# Patient Record
Sex: Male | Born: 1965
Health system: Southern US, Community
[De-identification: ages and names within clinical notes are randomized; demographics above are authoritative.]

## PROBLEM LIST (undated history)

## (undated) DIAGNOSIS — G4730 Sleep apnea, unspecified: Secondary | ICD-10-CM

## (undated) DIAGNOSIS — E785 Hyperlipidemia, unspecified: Secondary | ICD-10-CM

## (undated) DIAGNOSIS — T7840XA Allergy, unspecified, initial encounter: Secondary | ICD-10-CM

## (undated) DIAGNOSIS — I10 Essential (primary) hypertension: Secondary | ICD-10-CM

## (undated) DIAGNOSIS — R569 Unspecified convulsions: Secondary | ICD-10-CM

## (undated) DIAGNOSIS — I219 Acute myocardial infarction, unspecified: Secondary | ICD-10-CM

## (undated) DIAGNOSIS — C801 Malignant (primary) neoplasm, unspecified: Secondary | ICD-10-CM

## (undated) DIAGNOSIS — G709 Myoneural disorder, unspecified: Secondary | ICD-10-CM

## (undated) DIAGNOSIS — G4700 Insomnia, unspecified: Secondary | ICD-10-CM

## (undated) DIAGNOSIS — M1990 Unspecified osteoarthritis, unspecified site: Secondary | ICD-10-CM

## (undated) HISTORY — DX: Acute myocardial infarction, unspecified: I21.9

## (undated) HISTORY — DX: Malignant (primary) neoplasm, unspecified: C80.1

## (undated) HISTORY — DX: Unspecified osteoarthritis, unspecified site: M19.90

## (undated) HISTORY — PX: BACK SURGERY: SHX140

## (undated) HISTORY — PX: JOINT REPLACEMENT: SHX530

## (undated) HISTORY — PX: HAND SURGERY: SHX662

## (undated) HISTORY — DX: Sleep apnea, unspecified: G47.30

## (undated) HISTORY — DX: Unspecified convulsions: R56.9

## (undated) HISTORY — DX: Hyperlipidemia, unspecified: E78.5

## (undated) HISTORY — PX: SPINE SURGERY: SHX786

## (undated) HISTORY — DX: Allergy, unspecified, initial encounter: T78.40XA

## (undated) HISTORY — DX: Essential (primary) hypertension: I10

## (undated) HISTORY — DX: Insomnia, unspecified: G47.00

## (undated) HISTORY — PX: FRACTURE SURGERY: SHX138

---

## 2000-05-09 ENCOUNTER — Ambulatory Visit (HOSPITAL_BASED_OUTPATIENT_CLINIC_OR_DEPARTMENT_OTHER): Admission: RE | Admit: 2000-05-09 | Discharge: 2000-05-09 | Payer: Self-pay | Admitting: Orthopedic Surgery

## 2000-10-11 ENCOUNTER — Ambulatory Visit (HOSPITAL_BASED_OUTPATIENT_CLINIC_OR_DEPARTMENT_OTHER): Admission: RE | Admit: 2000-10-11 | Discharge: 2000-10-11 | Payer: Self-pay | Admitting: Orthopedic Surgery

## 2012-07-12 ENCOUNTER — Other Ambulatory Visit: Payer: Self-pay

## 2012-07-12 MED ORDER — PHENOBARBITAL 100 MG PO TABS: 100.0000 mg | ORAL_TABLET | Freq: Every day | ORAL | Status: DC

## 2012-07-12 MED ORDER — PHENOBARBITAL 16.2 MG PO TABS: 16.2000 mg | ORAL_TABLET | Freq: Two times a day (BID) | ORAL | Status: DC

## 2012-07-12 MED ORDER — VALSARTAN 80 MG PO TABS: 80.0000 mg | ORAL_TABLET | Freq: Every day | ORAL | Status: DC

## 2012-07-12 NOTE — Telephone Encounter (Signed)
Last seen 01/09/12  Last filled 04/15/12 x 2  If approved have nurse call in Phenobarb and notify patient

## 2012-07-15 ENCOUNTER — Telehealth: Payer: Self-pay | Admitting: Nurse Practitioner

## 2012-07-16 NOTE — Telephone Encounter (Signed)
Both still need to b called in?

## 2012-07-16 NOTE — Telephone Encounter (Signed)
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  Called meds to [facility].

## 2012-07-19 ENCOUNTER — Encounter: Payer: Self-pay | Admitting: Nurse Practitioner

## 2012-07-19 ENCOUNTER — Ambulatory Visit (INDEPENDENT_AMBULATORY_CARE_PROVIDER_SITE_OTHER): Payer: BC Managed Care – PPO | Admitting: Nurse Practitioner

## 2012-07-19 VITALS — BP 119/82 | HR 79 | Temp 98.0°F | Ht 72.0 in | Wt 235.0 lb

## 2012-07-19 DIAGNOSIS — I10 Essential (primary) hypertension: Secondary | ICD-10-CM

## 2012-07-19 DIAGNOSIS — Z125 Encounter for screening for malignant neoplasm of prostate: Secondary | ICD-10-CM

## 2012-07-19 DIAGNOSIS — R569 Unspecified convulsions: Secondary | ICD-10-CM

## 2012-07-19 DIAGNOSIS — E785 Hyperlipidemia, unspecified: Secondary | ICD-10-CM | POA: Insufficient documentation

## 2012-07-19 LAB — COMPLETE METABOLIC PANEL WITH GFR
ALT: 24 U/L (ref 0–53)
AST: 32 U/L (ref 0–37)
Albumin: 4.5 g/dL (ref 3.5–5.2)
Alkaline Phosphatase: 129 U/L — ABNORMAL HIGH (ref 39–117)
BUN: 11 mg/dL (ref 6–23)
CO2: 28 mEq/L (ref 19–32)
Calcium: 9.7 mg/dL (ref 8.4–10.5)
Chloride: 103 mEq/L (ref 96–112)
Creat: 1.11 mg/dL (ref 0.50–1.35)
GFR, Est African American: >89 mL/min
GFR, Est Non African American: 78 mL/min
Glucose, Bld: 84 mg/dL (ref 70–99)
Potassium: 3.8 mEq/L (ref 3.5–5.3)
Sodium: 140 mEq/L (ref 135–145)
Total Bilirubin: 0.5 mg/dL (ref 0.3–1.2)
Total Protein: 7.6 g/dL (ref 6.0–8.3)

## 2012-07-19 MED ORDER — ROSUVASTATIN CALCIUM 10 MG PO TABS: 10.0000 mg | ORAL_TABLET | Freq: Every day | ORAL | Status: DC

## 2012-07-19 MED ORDER — GRISEOFULVIN MICROSIZE 500 MG PO TABS: 500.0000 mg | ORAL_TABLET | Freq: Every day | ORAL | Status: DC

## 2012-07-19 MED ORDER — OMEGA-3-ACID ETHYL ESTERS 1 G PO CAPS: 2.0000 g | ORAL_CAPSULE | Freq: Two times a day (BID) | ORAL | Status: DC

## 2012-07-19 MED ORDER — PHENOBARBITAL 16.2 MG PO TABS: 16.2000 mg | ORAL_TABLET | Freq: Two times a day (BID) | ORAL | Status: DC

## 2012-07-19 MED ORDER — PHENOBARBITAL 100 MG PO TABS: 100.0000 mg | ORAL_TABLET | Freq: Every day | ORAL | Status: DC

## 2012-07-19 MED ORDER — NIACIN ER (ANTIHYPERLIPIDEMIC) 500 MG PO TBCR: 500.0000 mg | EXTENDED_RELEASE_TABLET | Freq: Every day | ORAL | Status: DC

## 2012-07-19 MED ORDER — VALSARTAN 80 MG PO TABS: 80.0000 mg | ORAL_TABLET | Freq: Every day | ORAL | Status: DC

## 2012-07-19 NOTE — Progress Notes (Signed)
Subjective:    Patient ID: Christopher Salas, male    DOB: [birth year], 47 y.o.   MRN: [id number]  Hypertension This is a chronic problem. The current episode started more than 1 year ago. The problem is unchanged. The problem is controlled. Pertinent negatives include no blurred vision, chest pain, headaches, palpitations, peripheral edema, PND, shortness of breath or sweats. There are no associated agents to hypertension. Risk factors for coronary artery disease include dyslipidemia, obesity, male gender and sedentary lifestyle. Past treatments include angiotensin blockers. The current treatment provides significant improvement. Compliance problems include diet and exercise.   Hyperlipidemia This is a chronic problem. The current episode started more than 1 year ago. The problem is controlled. Recent lipid tests were reviewed and are normal. Exacerbating diseases include obesity. There are no known factors aggravating his hyperlipidemia. Pertinent negatives include no chest pain, focal weakness, leg pain, myalgias or shortness of breath. Current antihyperlipidemic treatment includes statins and fibric acid derivatives. The current treatment provides moderate improvement of lipids. Compliance problems include adherence to diet and adherence to exercise.  Risk factors for coronary artery disease include male sex, hypertension and obesity.  Seizures  This is a chronic problem. Episode onset: greater than 20 years. The problem has not changed since onset.There were 2 to 3 (Last seizure was over 19 years ago) seizures. The most recent episode lasted less than 30 seconds. Pertinent negatives include no headaches and no chest pain. Characteristics include rhythmic jerking and loss of consciousness. The episode was witnessed. There was the sensation of an aura present. The seizure(s) had no focality.  * Skin lesion on forehead and post neck- Getting larger    Review of Systems  Constitutional: Negative.   HENT:  Negative.   Eyes: Negative.  Negative for blurred vision.  Respiratory: Negative for shortness of breath.   Cardiovascular: Negative for chest pain, palpitations and PND.  Genitourinary: Negative.   Musculoskeletal: Negative for myalgias.  Neurological: Positive for seizures and loss of consciousness. Negative for focal weakness and headaches.  Hematological: Negative.   Psychiatric/Behavioral: Negative.        Objective:   Physical Exam  Constitutional: He is oriented to person, place, and time. He appears well-developed and well-nourished.  HENT:  Head: Normocephalic.  Right Ear: External ear normal.  Left Ear: External ear normal.  Nose: Nose normal.  Mouth/Throat: Oropharynx is clear and moist.  Eyes: Conjunctivae and EOM are normal. Pupils are equal, round, and reactive to light.  Neck: Normal range of motion. Neck supple.  Cardiovascular: Normal rate, regular rhythm, normal heart sounds and intact distal pulses.   Pulmonary/Chest: Effort normal and breath sounds normal.  Abdominal: Soft. Bowel sounds are normal.  Neurological: He is alert and oriented to person, place, and time. He has normal reflexes.  Skin: Skin is warm.  Annular rash with central clearing on forehead and post neck  Psychiatric: He has a normal mood and affect. His behavior is normal. Judgment and thought content normal.   BP 119/82  Pulse 79  Temp(Src) 98 F (36.7 C) (Oral)  Ht 6' (1.829 m)  Wt 235 lb (106.595 kg)  BMI 31.86 kg/m2        Assessment & Plan:  1. Hypertension Low Na+ diet - COMPLETE METABOLIC PANEL WITH GFR - valsartan (DIOVAN) 80 MG tablet; Take 1 tablet (80 mg total) by mouth daily.  Dispense: 90 tablet; Refill: 1  2. Hyperlipidemia Low fat diet and exercise - NMR Lipoprofile with Lipids - rosuvastatin (CRESTOR)  10 MG tablet; Take 1 tablet (10 mg total) by mouth daily.  Dispense: 90 tablet; Refill: 1 - niacin (NIASPAN) 500 MG CR tablet; Take 1 tablet (500 mg total) by  mouth at bedtime.  Dispense: 90 tablet; Refill: 1 - omega-3 acid ethyl esters (LOVAZA) 1 G capsule; Take 2 capsules (2 g total) by mouth 2 (two) times daily.  Dispense: 120 capsule; Refill: 5  3. Seizures  - Phenobarbital level - phenobarbital (LUMINAL) 16.2 MG tablet; Take 1 tablet (16.2 mg total) by mouth 2 (two) times daily.  Dispense: 60 tablet; Refill: 5 - PHENObarbital (LUMINAL) 100 MG tablet; Take 1 tablet (100 mg total) by mouth daily.  Dispense: 30 tablet; Refill: 5  4. Screening for prostate cancer  - PSA  5. Tinea Capitis DO NOT PICK OR SCRATCH -grifluvin 500 1 Po QD x12 WEEKS Mary-Margaret Daphine Deutscher, FNP

## 2012-07-19 NOTE — Patient Instructions (Signed)

## 2012-07-20 LAB — PSA: PSA: 2.02 ng/mL (ref <=4.00)

## 2012-07-20 LAB — PHENOBARBITAL LEVEL: Phenobarbital: 17.6 ug/mL (ref 15.0–40.0)

## 2012-07-23 LAB — NMR LIPOPROFILE WITH LIPIDS
Cholesterol, Total: 123 mg/dL (ref <200)
HDL Particle Number: 31.8 umol/L (ref >=30.5)
HDL Size: 8.6 nm — ABNORMAL LOW (ref >=9.2)
HDL-C: 36 mg/dL — ABNORMAL LOW (ref >=40)
LDL (calc): 43 mg/dL (ref <100)
LDL Particle Number: 627 nmol/L (ref <1000)
LDL Size: 19.6 nm — ABNORMAL LOW (ref >20.5)
LP-IR Score: 63 — ABNORMAL HIGH (ref <=45)
Large HDL-P: 2.6 umol/L — ABNORMAL LOW (ref >=4.8)
Large VLDL-P: 2.6 nmol/L (ref <=2.7)
Small LDL Particle Number: 511 nmol/L (ref <=527)
Triglycerides: 220 mg/dL — ABNORMAL HIGH (ref <150)
VLDL Size: 41.5 nm (ref <=46.6)

## 2012-08-14 ENCOUNTER — Other Ambulatory Visit: Payer: Self-pay | Admitting: Nurse Practitioner

## 2012-08-16 ENCOUNTER — Telehealth: Payer: Self-pay | Admitting: Nurse Practitioner

## 2012-08-16 MED ORDER — PHENOBARBITAL 16.2 MG PO TABS: 16.2000 mg | ORAL_TABLET | Freq: Two times a day (BID) | ORAL | Status: DC

## 2012-08-16 MED ORDER — PHENOBARBITAL 100 MG PO TABS: 100.0000 mg | ORAL_TABLET | Freq: Every day | ORAL | Status: DC

## 2012-08-16 NOTE — Telephone Encounter (Signed)
rx sent to pharmacy.patient aware. 

## 2012-08-16 NOTE — Telephone Encounter (Signed)
Please advise 

## 2012-08-16 NOTE — Telephone Encounter (Signed)
These were refilled yesterday.

## 2012-11-07 ENCOUNTER — Telehealth: Payer: Self-pay | Admitting: Family Medicine

## 2012-11-07 ENCOUNTER — Ambulatory Visit (INDEPENDENT_AMBULATORY_CARE_PROVIDER_SITE_OTHER): Payer: BC Managed Care – PPO

## 2012-11-07 ENCOUNTER — Encounter: Payer: Self-pay | Admitting: General Practice

## 2012-11-07 ENCOUNTER — Ambulatory Visit (INDEPENDENT_AMBULATORY_CARE_PROVIDER_SITE_OTHER): Payer: BC Managed Care – PPO | Admitting: General Practice

## 2012-11-07 VITALS — BP 136/89 | HR 76 | Temp 97.5°F | Ht 72.0 in | Wt 242.0 lb

## 2012-11-07 DIAGNOSIS — M629 Disorder of muscle, unspecified: Secondary | ICD-10-CM

## 2012-11-07 DIAGNOSIS — M6289 Other specified disorders of muscle: Secondary | ICD-10-CM

## 2012-11-07 DIAGNOSIS — M25519 Pain in unspecified shoulder: Secondary | ICD-10-CM

## 2012-11-07 DIAGNOSIS — M25511 Pain in right shoulder: Secondary | ICD-10-CM

## 2012-11-07 MED ORDER — NAPROXEN 500 MG PO TABS: 500.0000 mg | ORAL_TABLET | Freq: Two times a day (BID) | ORAL | Status: DC

## 2012-11-07 MED ORDER — METHYLPREDNISOLONE ACETATE 80 MG/ML IJ SUSP
80.0000 mg | Freq: Once | INTRAMUSCULAR | Status: AC
  Administered 2012-11-07: 80 mg via INTRAMUSCULAR

## 2012-11-07 MED ORDER — CYCLOBENZAPRINE HCL 5 MG PO TABS: 5.0000 mg | ORAL_TABLET | Freq: Two times a day (BID) | ORAL | Status: DC | PRN

## 2012-11-07 NOTE — Patient Instructions (Addendum)
Shoulder Pain  The shoulder is the joint that connects your arms to your body. The bones that form the shoulder joint include the upper arm bone (humerus), the shoulder blade (scapula), and the collarbone (clavicle). The top of the humerus is shaped like a ball and fits into a rather flat socket on the scapula (glenoid cavity). A combination of muscles and strong, fibrous tissues that connect muscles to bones (tendons) support your shoulder joint and hold the ball in the socket. Small, fluid-filled sacs (bursae) are located in different areas of the joint. They act as cushions between the bones and the overlying soft tissues and help reduce friction between the gliding tendons and the bone as you move your arm. Your shoulder joint allows a wide range of motion in your arm. This range of motion allows you to do things like scratch your back or throw a ball. However, this range of motion also makes your shoulder more prone to pain from overuse and injury.  Causes of shoulder pain can originate from both injury and overuse and usually can be grouped in the following four categories:   Redness, swelling, and pain (inflammation) of the tendon (tendinitis) or the bursae (bursitis).   Instability, such as a dislocation of the joint.   Inflammation of the joint (arthritis).   Broken bone (fracture).  HOME CARE INSTRUCTIONS    Apply ice to the sore area.   Put ice in a plastic bag.   Place a towel between your skin and the bag.   Leave the ice on for 15-20 minutes, 3-4 times per day for the first 2 days.   Stop using cold packs if they do not help with the pain.   If you have a shoulder sling or immobilizer, wear it as long as your caregiver instructs. Only remove it to shower or bathe. Move your arm as little as possible, but keep your hand moving to prevent swelling.   Squeeze a soft ball or foam pad as much as possible to help prevent swelling.   Only take over-the-counter or prescription medicines for pain,  discomfort, or fever as directed by your caregiver.  SEEK MEDICAL CARE IF:    Your shoulder pain increases, or new pain develops in your arm, hand, or fingers.   Your hand or fingers become cold and numb.   Your pain is not relieved with medicines.  SEEK IMMEDIATE MEDICAL CARE IF:    Your arm, hand, or fingers are numb or tingling.   Your arm, hand, or fingers are significantly swollen or turn white or blue.  MAKE SURE YOU:    Understand these instructions.   Will watch your condition.   Will get help right away if you are not doing well or get worse.  Document Released: 12/14/2004 Document Revised: 11/29/2011 Document Reviewed: 02/18/2011  [employer] Patient Information 2014 [employer].

## 2012-11-07 NOTE — Telephone Encounter (Signed)
Appt scheduled for 3:30. Patient aware. 

## 2012-11-07 NOTE — Progress Notes (Signed)
  Subjective:    Patient ID: Christopher Salas, male    DOB: 21-Feb-1966, 47 y.o.   MRN: [id number]  Shoulder Pain  The pain is present in the right shoulder. This is a new problem. The current episode started more than 1 month ago. There has been no history of extremity trauma. The problem occurs constantly. The problem has been gradually worsening. The quality of the pain is described as aching, sharp and burning. The pain is at a severity of 10/10. Associated symptoms include a limited range of motion. Pertinent negatives include no fever, joint locking, joint swelling, numbness or stiffness. The symptoms are aggravated by lying down and activity. He has tried acetaminophen and NSAIDS for the symptoms. Family history does not include gout or rheumatoid arthritis. There is no history of osteoarthritis.  Reports having right shoulder pain, which began 2.5 weeks after having surgery on right hand (also worn cast). Reports using OTC muscle rubs, without relief.     Review of Systems  Constitutional: Negative for fever and chills.  HENT: Negative for neck pain and neck stiffness.   Respiratory: Negative for chest tightness and shortness of breath.   Cardiovascular: Negative for chest pain and palpitations.  Musculoskeletal: Negative for stiffness.       Right shoulder pain  Neurological: Negative for numbness.       Objective:   Physical Exam  Constitutional: He is oriented to person, place, and time. He appears well-developed and well-nourished.  Cardiovascular: Normal rate, regular rhythm and normal heart sounds.   Pulmonary/Chest: Effort normal and breath sounds normal.  Musculoskeletal: He exhibits tenderness.  Tenderness to right shoulder upon palpation and with raising of right arm  Neurological: He is alert and oriented to person, place, and time.  Skin: Skin is warm and dry.  Psychiatric: He has a normal mood and affect.   [facility] reading (PRIMARY) by Coralie Keens, FNP-C, no fracture  or dislocation.                                        Assessment & Plan:  1. Right shoulder pain - DG Shoulder Right; Future - naproxen (NAPROSYN) 500 MG tablet; Take 1 tablet (500 mg total) by mouth 2 (two) times daily with a meal.  Dispense: 30 tablet; Refill: 0 - methylPREDNISolone acetate (DEPO-MEDROL) injection 80 mg; Inject 1 mL (80 mg total) into the muscle once. -RTO in 2 weeks for follow up and if no improvement, will refer to ortho  2. Muscle tightness - cyclobenzaprine (FLEXERIL) 5 MG tablet; Take 1 tablet (5 mg total) by mouth 2 (two) times daily as needed for muscle spasms.  Dispense: 15 tablet; Refill: 0 -Patient verbalized understanding -Coralie Keens, FNP-C

## 2012-11-14 ENCOUNTER — Other Ambulatory Visit: Payer: Self-pay | Admitting: Nurse Practitioner

## 2012-11-14 ENCOUNTER — Other Ambulatory Visit: Payer: Self-pay | Admitting: *Deleted

## 2012-11-14 DIAGNOSIS — E785 Hyperlipidemia, unspecified: Secondary | ICD-10-CM

## 2012-11-14 DIAGNOSIS — I10 Essential (primary) hypertension: Secondary | ICD-10-CM

## 2012-11-14 MED ORDER — OMEGA-3-ACID ETHYL ESTERS 1 G PO CAPS: 2.0000 g | ORAL_CAPSULE | Freq: Two times a day (BID) | ORAL | Status: DC

## 2012-11-14 MED ORDER — VALSARTAN 80 MG PO TABS: 80.0000 mg | ORAL_TABLET | Freq: Every day | ORAL | Status: DC

## 2012-11-14 MED ORDER — NIACIN ER (ANTIHYPERLIPIDEMIC) 500 MG PO TBCR: 500.0000 mg | EXTENDED_RELEASE_TABLET | Freq: Every day | ORAL | Status: DC

## 2012-11-19 ENCOUNTER — Telehealth: Payer: Self-pay | Admitting: *Deleted

## 2012-11-19 NOTE — Telephone Encounter (Signed)
Pt aware, rx's  for phenobarbital ready.

## 2012-12-13 ENCOUNTER — Other Ambulatory Visit (INDEPENDENT_AMBULATORY_CARE_PROVIDER_SITE_OTHER): Payer: BC Managed Care – PPO

## 2012-12-13 ENCOUNTER — Telehealth: Payer: Self-pay | Admitting: General Practice

## 2012-12-13 DIAGNOSIS — E785 Hyperlipidemia, unspecified: Secondary | ICD-10-CM

## 2012-12-13 DIAGNOSIS — R569 Unspecified convulsions: Secondary | ICD-10-CM

## 2012-12-13 NOTE — Progress Notes (Signed)
Pt came in for labs only 

## 2012-12-14 LAB — CMP14+EGFR
ALT: 24 IU/L (ref 0–44)
AST: 32 IU/L (ref 0–40)
Albumin/Globulin Ratio: 2.1 (ref 1.1–2.5)
Albumin: 4.6 g/dL (ref 3.5–5.5)
Alkaline Phosphatase: 135 IU/L — ABNORMAL HIGH (ref 39–117)
BUN/Creatinine Ratio: 12 (ref 9–20)
BUN: 9 mg/dL (ref 6–24)
CO2: 24 mmol/L (ref 18–29)
Calcium: 9.7 mg/dL (ref 8.7–10.2)
Chloride: 101 mmol/L (ref 97–108)
Creatinine, Ser: 0.78 mg/dL (ref 0.76–1.27)
GFR calc Af Amer: 123 mL/min/{1.73_m2} (ref >59)
GFR calc non Af Amer: 106 mL/min/{1.73_m2} (ref >59)
Globulin, Total: 2.2 g/dL (ref 1.5–4.5)
Glucose: 87 mg/dL (ref 65–99)
Potassium: 4.1 mmol/L (ref 3.5–5.2)
Sodium: 143 mmol/L (ref 134–144)
Total Bilirubin: 0.2 mg/dL (ref 0.0–1.2)
Total Protein: 6.8 g/dL (ref 6.0–8.5)

## 2012-12-14 LAB — NMR, LIPOPROFILE
Cholesterol: 120 mg/dL (ref <200)
HDL Cholesterol by NMR: 43 mg/dL (ref >=40)
HDL Particle Number: 36.5 umol/L (ref >=30.5)
LDL Particle Number: 708 nmol/L (ref <1000)
LDL Size: 20.6 nm (ref >20.5)
LDLC SERPL CALC-MCNC: 45 mg/dL (ref <100)
LP-IR Score: 57 — ABNORMAL HIGH (ref <=45)
Small LDL Particle Number: 447 nmol/L (ref <=527)
Triglycerides by NMR: 160 mg/dL — ABNORMAL HIGH (ref <150)

## 2012-12-14 LAB — PHENYTOIN LEVEL, TOTAL: Phenytoin Lvl: <0.6 ug/mL — ABNORMAL LOW (ref 10.0–20.0)

## 2012-12-17 NOTE — Telephone Encounter (Signed)
He had labs on 12/13/12, see them before filling these meds

## 2012-12-18 ENCOUNTER — Other Ambulatory Visit: Payer: Self-pay | Admitting: General Practice

## 2012-12-18 ENCOUNTER — Telehealth: Payer: Self-pay | Admitting: General Practice

## 2012-12-18 NOTE — Telephone Encounter (Signed)
Attempted to contact patient without success. Please ask patient if he has missed any doses of seizure medication

## 2012-12-19 ENCOUNTER — Telehealth: Payer: Self-pay | Admitting: General Practice

## 2012-12-19 ENCOUNTER — Other Ambulatory Visit: Payer: Self-pay | Admitting: General Practice

## 2012-12-19 DIAGNOSIS — R569 Unspecified convulsions: Secondary | ICD-10-CM

## 2012-12-19 NOTE — Telephone Encounter (Signed)
Please review labs. 

## 2012-12-20 ENCOUNTER — Other Ambulatory Visit (INDEPENDENT_AMBULATORY_CARE_PROVIDER_SITE_OTHER): Payer: BC Managed Care – PPO

## 2012-12-20 DIAGNOSIS — R569 Unspecified convulsions: Secondary | ICD-10-CM

## 2012-12-20 NOTE — Telephone Encounter (Signed)
Patient notified about labs and to come in for additional labs

## 2012-12-21 LAB — PHENOBARBITAL LEVEL: Phenobarbital Lvl: 13 ug/mL — ABNORMAL LOW (ref 15–40)

## 2012-12-23 NOTE — Progress Notes (Signed)
Patient came in for labs only.

## 2012-12-25 ENCOUNTER — Other Ambulatory Visit: Payer: Self-pay | Admitting: General Practice

## 2012-12-25 DIAGNOSIS — IMO0002 Reserved for concepts with insufficient information to code with codable children: Secondary | ICD-10-CM

## 2012-12-26 ENCOUNTER — Telehealth: Payer: Self-pay | Admitting: General Practice

## 2012-12-26 NOTE — Telephone Encounter (Signed)
Please advise 

## 2012-12-26 NOTE — Telephone Encounter (Signed)
Patient should be referred to ortho for further evaluation if no improvement from last visit as we discussed. I don't know that another IM injection will help.

## 2012-12-27 ENCOUNTER — Telehealth: Payer: Self-pay | Admitting: General Practice

## 2012-12-27 ENCOUNTER — Encounter: Payer: Self-pay | Admitting: Family Medicine

## 2012-12-27 ENCOUNTER — Ambulatory Visit: Payer: BC Managed Care – PPO | Admitting: Family Medicine

## 2012-12-27 NOTE — Telephone Encounter (Signed)
Patient spoke with triage.

## 2012-12-27 NOTE — Telephone Encounter (Signed)
Needs someone to call him about a question about injection/ He left a in basket yesterday

## 2012-12-27 NOTE — Telephone Encounter (Signed)
Pt has appt with [facility].

## 2013-03-17 ENCOUNTER — Other Ambulatory Visit: Payer: Self-pay | Admitting: General Practice

## 2013-03-17 ENCOUNTER — Other Ambulatory Visit: Payer: Self-pay

## 2013-03-17 MED ORDER — PHENOBARBITAL 100 MG PO TABS: 100.0000 mg | ORAL_TABLET | Freq: Every day | ORAL | Status: DC

## 2013-03-17 NOTE — Telephone Encounter (Signed)
Last seen 11/07/12  Mae   If approved route to nurse to call into [place]

## 2013-03-17 NOTE — Telephone Encounter (Signed)
Please phone in

## 2013-03-19 ENCOUNTER — Other Ambulatory Visit: Payer: Self-pay | Admitting: Family Medicine

## 2013-03-19 ENCOUNTER — Other Ambulatory Visit: Payer: Self-pay | Admitting: *Deleted

## 2013-03-19 ENCOUNTER — Telehealth: Payer: Self-pay | Admitting: *Deleted

## 2013-03-19 MED ORDER — PHENOBARBITAL 16.2 MG PO TABS: ORAL_TABLET | ORAL | Status: DC

## 2013-03-19 NOTE — Telephone Encounter (Signed)
Message left, Luminal script ready.

## 2013-04-13 ENCOUNTER — Other Ambulatory Visit: Payer: Self-pay | Admitting: Family Medicine

## 2013-04-15 ENCOUNTER — Other Ambulatory Visit: Payer: Self-pay | Admitting: Family Medicine

## 2013-04-15 NOTE — Telephone Encounter (Signed)
Last seen 11/07/12  MMM

## 2013-04-16 ENCOUNTER — Other Ambulatory Visit: Payer: Self-pay | Admitting: General Practice

## 2013-04-16 DIAGNOSIS — R569 Unspecified convulsions: Secondary | ICD-10-CM

## 2013-04-16 MED ORDER — PHENOBARBITAL 100 MG PO TABS: 100.0000 mg | ORAL_TABLET | Freq: Every day | ORAL | Status: DC

## 2013-05-14 ENCOUNTER — Other Ambulatory Visit: Payer: Self-pay | Admitting: Nurse Practitioner

## 2013-05-16 ENCOUNTER — Telehealth: Payer: Self-pay | Admitting: Family Medicine

## 2013-05-16 ENCOUNTER — Other Ambulatory Visit: Payer: Self-pay | Admitting: Nurse Practitioner

## 2013-05-16 DIAGNOSIS — R569 Unspecified convulsions: Secondary | ICD-10-CM

## 2013-05-16 MED ORDER — PHENOBARBITAL 100 MG PO TABS: 100.0000 mg | ORAL_TABLET | Freq: Every day | ORAL | Status: DC

## 2013-05-16 NOTE — Telephone Encounter (Signed)
Luminal script ready.  LM for patient.

## 2013-05-16 NOTE — Telephone Encounter (Signed)
Patients needs all RX sent to the pharmacy

## 2013-05-17 ENCOUNTER — Other Ambulatory Visit: Payer: Self-pay | Admitting: Family Medicine

## 2013-05-17 DIAGNOSIS — E785 Hyperlipidemia, unspecified: Secondary | ICD-10-CM

## 2013-05-17 DIAGNOSIS — I10 Essential (primary) hypertension: Secondary | ICD-10-CM

## 2013-05-17 DIAGNOSIS — R569 Unspecified convulsions: Secondary | ICD-10-CM

## 2013-05-17 MED ORDER — VALSARTAN 80 MG PO TABS: 80.0000 mg | ORAL_TABLET | Freq: Every day | ORAL | Status: DC

## 2013-05-17 MED ORDER — PHENOBARBITAL 16.2 MG PO TABS: 16.2000 mg | ORAL_TABLET | Freq: Two times a day (BID) | ORAL | Status: DC

## 2013-05-17 MED ORDER — PHENOBARBITAL 100 MG PO TABS: 100.0000 mg | ORAL_TABLET | Freq: Every day | ORAL | Status: DC

## 2013-05-17 MED ORDER — ROSUVASTATIN CALCIUM 10 MG PO TABS: 10.0000 mg | ORAL_TABLET | Freq: Every day | ORAL | Status: DC

## 2013-05-17 NOTE — Telephone Encounter (Signed)
Left message that RX was ready for pick up Rx to the front

## 2013-05-17 NOTE — Telephone Encounter (Signed)
NTBS and will refill only 30 days

## 2013-05-19 ENCOUNTER — Other Ambulatory Visit: Payer: Self-pay | Admitting: Family Medicine

## 2013-05-19 ENCOUNTER — Other Ambulatory Visit: Payer: Self-pay | Admitting: *Deleted

## 2013-05-19 ENCOUNTER — Telehealth: Payer: Self-pay | Admitting: Family Medicine

## 2013-05-19 DIAGNOSIS — E785 Hyperlipidemia, unspecified: Secondary | ICD-10-CM

## 2013-05-19 DIAGNOSIS — I10 Essential (primary) hypertension: Secondary | ICD-10-CM

## 2013-05-19 MED ORDER — VALSARTAN 80 MG PO TABS: 80.0000 mg | ORAL_TABLET | Freq: Every day | ORAL | Status: DC

## 2013-05-19 MED ORDER — NIACIN ER (ANTIHYPERLIPIDEMIC) 500 MG PO TBCR: 500.0000 mg | EXTENDED_RELEASE_TABLET | Freq: Every day | ORAL | Status: DC

## 2013-05-19 MED ORDER — OMEGA-3-ACID ETHYL ESTERS 1 G PO CAPS: 2.0000 g | ORAL_CAPSULE | Freq: Two times a day (BID) | ORAL | Status: DC

## 2013-05-19 NOTE — Telephone Encounter (Signed)
Patient aware left message told to call back if have any questions

## 2013-07-07 ENCOUNTER — Other Ambulatory Visit: Payer: Self-pay | Admitting: Nurse Practitioner

## 2013-07-12 ENCOUNTER — Other Ambulatory Visit: Payer: Self-pay | Admitting: Nurse Practitioner

## 2013-07-14 NOTE — Telephone Encounter (Signed)
Last seen 11/07/12  Mae  Last lipid 12/08/12

## 2013-07-15 ENCOUNTER — Telehealth: Payer: Self-pay | Admitting: Family Medicine

## 2013-07-15 DIAGNOSIS — I10 Essential (primary) hypertension: Secondary | ICD-10-CM

## 2013-07-15 DIAGNOSIS — E785 Hyperlipidemia, unspecified: Secondary | ICD-10-CM

## 2013-07-16 MED ORDER — OMEGA-3-ACID ETHYL ESTERS 1 G PO CAPS: 2.0000 g | ORAL_CAPSULE | Freq: Two times a day (BID) | ORAL | Status: DC

## 2013-07-16 MED ORDER — NIACIN ER (ANTIHYPERLIPIDEMIC) 500 MG PO TBCR: 500.0000 mg | EXTENDED_RELEASE_TABLET | Freq: Every day | ORAL | Status: DC

## 2013-07-16 MED ORDER — VALSARTAN 80 MG PO TABS: 80.0000 mg | ORAL_TABLET | Freq: Every day | ORAL | Status: DC

## 2013-07-16 NOTE — Telephone Encounter (Signed)
done

## 2013-07-25 ENCOUNTER — Ambulatory Visit (INDEPENDENT_AMBULATORY_CARE_PROVIDER_SITE_OTHER): Payer: BC Managed Care – PPO | Admitting: Nurse Practitioner

## 2013-07-25 ENCOUNTER — Encounter: Payer: Self-pay | Admitting: Nurse Practitioner

## 2013-07-25 VITALS — BP 121/79 | HR 76 | Temp 98.0°F | Ht 72.0 in | Wt 233.0 lb

## 2013-07-25 DIAGNOSIS — Z6831 Body mass index (BMI) 31.0-31.9, adult: Secondary | ICD-10-CM

## 2013-07-25 DIAGNOSIS — Z125 Encounter for screening for malignant neoplasm of prostate: Secondary | ICD-10-CM

## 2013-07-25 DIAGNOSIS — R569 Unspecified convulsions: Secondary | ICD-10-CM

## 2013-07-25 DIAGNOSIS — E785 Hyperlipidemia, unspecified: Secondary | ICD-10-CM

## 2013-07-25 DIAGNOSIS — I10 Essential (primary) hypertension: Secondary | ICD-10-CM

## 2013-07-25 DIAGNOSIS — Z713 Dietary counseling and surveillance: Secondary | ICD-10-CM

## 2013-07-25 MED ORDER — PHENOBARBITAL 16.2 MG PO TABS: 16.2000 mg | ORAL_TABLET | Freq: Two times a day (BID) | ORAL | Status: DC

## 2013-07-25 MED ORDER — PHENOBARBITAL 100 MG PO TABS: 100.0000 mg | ORAL_TABLET | Freq: Every day | ORAL | Status: DC

## 2013-07-25 MED ORDER — ROSUVASTATIN CALCIUM 10 MG PO TABS: 10.0000 mg | ORAL_TABLET | Freq: Every day | ORAL | Status: DC

## 2013-07-25 MED ORDER — VALSARTAN 80 MG PO TABS: 80.0000 mg | ORAL_TABLET | Freq: Every day | ORAL | Status: DC

## 2013-07-25 NOTE — Patient Instructions (Signed)

## 2013-07-25 NOTE — Progress Notes (Signed)
Subjective:    Patient ID: Christopher Salas, male    DOB: 1966-02-19, 48 y.o.   MRN: [id number]  Patient here today for follow up of chronic medical problems. Feels good today- no complaints.   Hypertension This is a chronic problem. The current episode started more than 1 year ago. The problem is unchanged. The problem is controlled. Pertinent negatives include no blurred vision, chest pain, headaches, palpitations, peripheral edema, PND, shortness of breath or sweats. There are no associated agents to hypertension. Risk factors for coronary artery disease include dyslipidemia, obesity, male gender and sedentary lifestyle. Past treatments include angiotensin blockers. The current treatment provides significant improvement. Compliance problems include diet and exercise.   Hyperlipidemia This is a chronic problem. The current episode started more than 1 year ago. The problem is controlled. Recent lipid tests were reviewed and are normal. Exacerbating diseases include obesity. There are no known factors aggravating his hyperlipidemia. Pertinent negatives include no chest pain, focal weakness, leg pain, myalgias or shortness of breath. Current antihyperlipidemic treatment includes statins and fibric acid derivatives. The current treatment provides moderate improvement of lipids. Compliance problems include adherence to diet and adherence to exercise.  Risk factors for coronary artery disease include male sex, hypertension and obesity.  Seizures  This is a chronic problem. Episode onset: greater than 20 years. The problem has not changed since onset.There were 2 to 3 (Last seizure was over 19 years ago) seizures. The most recent episode lasted less than 30 seconds. Pertinent negatives include no headaches and no chest pain. Characteristics include rhythmic jerking and loss of consciousness. The episode was witnessed. There was the sensation of an aura present. The seizure(s) had no focality.      Review of  Systems  Constitutional: Negative.   HENT: Negative.   Eyes: Negative.  Negative for blurred vision.  Respiratory: Negative for shortness of breath.   Cardiovascular: Negative for chest pain, palpitations and PND.  Genitourinary: Negative.   Musculoskeletal: Negative for myalgias.  Neurological: Positive for seizures and loss of consciousness. Negative for focal weakness and headaches.  Hematological: Negative.   Psychiatric/Behavioral: Negative.        Objective:   Physical Exam  Constitutional: He is oriented to person, place, and time. He appears well-developed and well-nourished.  HENT:  Head: Normocephalic.  Right Ear: External ear normal.  Left Ear: External ear normal.  Nose: Nose normal.  Mouth/Throat: Oropharynx is clear and moist.  Eyes: Conjunctivae and EOM are normal. Pupils are equal, round, and reactive to light.  Neck: Normal range of motion. Neck supple.  Cardiovascular: Normal rate, regular rhythm, normal heart sounds and intact distal pulses.   Pulmonary/Chest: Effort normal and breath sounds normal.  Abdominal: Soft. Bowel sounds are normal.  Genitourinary:  Refuses prostate check.  Musculoskeletal:  Had crushing injury to right hand and has very limited use of tt- wears a sleeve on it all the time.  Neurological: He is alert and oriented to person, place, and time. He has normal reflexes.  Skin: Skin is warm.  Psychiatric: He has a normal mood and affect. His behavior is normal. Judgment and thought content normal.   BP 121/79  Pulse 76  Temp(Src) 98 F (36.7 C) (Oral)  Ht 6' (1.829 m)  Wt 233 lb (105.688 kg)  BMI 31.59 kg/m2        Assessment & Plan:   1. Seizures   2. Hypertension   3. Hyperlipidemia   4. BMI 31.0-31.9,adult   5. Weight loss  counseling, encounter for   6. Prostate cancer screening    Orders Placed This Encounter  Procedures  . CMP14+EGFR  . NMR, lipoprofile  . PSA, total and free   Meds ordered this encounter   Medications  . rosuvastatin (CRESTOR) 10 MG tablet    Sig: Take 1 tablet (10 mg total) by mouth daily.    Dispense:  90 tablet    Refill:  1    Order Specific Question:  Supervising Provider    Answer:  Chipper Herb [[id number]]  . valsartan (DIOVAN) 80 MG tablet    Sig: Take 1 tablet (80 mg total) by mouth daily.    Dispense:  90 tablet    Refill:  1    Order Specific Question:  Supervising Provider    Answer:  Chipper Herb [[id number]]  . PHENObarbital (LUMINAL) 100 MG tablet    Sig: Take 1 tablet (100 mg total) by mouth daily.    Dispense:  90 tablet    Refill:  1    Order Specific Question:  Supervising Provider    Answer:  Chipper Herb [[id number]]  . phenobarbital (LUMINAL) 16.2 MG tablet    Sig: Take 1 tablet (16.2 mg total) by mouth 2 (two) times daily.    Dispense:  180 tablet    Refill:  1    Order Specific Question:  Supervising Provider    Answer:  Chipper Herb [[id number]]    Labs pending Health maintenance reviewed Diet and exercise encouraged Continue all meds Follow up  In 3 months   [place], FNP

## 2013-07-26 LAB — NMR, LIPOPROFILE
Cholesterol: 135 mg/dL (ref <200)
HDL Cholesterol by NMR: 40 mg/dL (ref >=40)
HDL Particle Number: 33.5 umol/L (ref >=30.5)
LDL Particle Number: 624 nmol/L (ref <1000)
LDL Size: 19.8 nm (ref >20.5)
LDLC SERPL CALC-MCNC: 44 mg/dL (ref <100)
LP-IR Score: 71 — ABNORMAL HIGH (ref <=45)
Small LDL Particle Number: 441 nmol/L (ref <=527)
Triglycerides by NMR: 256 mg/dL — ABNORMAL HIGH (ref <150)

## 2013-07-26 LAB — CMP14+EGFR
ALT: 23 IU/L (ref 0–44)
AST: 31 IU/L (ref 0–40)
Albumin/Globulin Ratio: 1.8 (ref 1.1–2.5)
Albumin: 4.8 g/dL (ref 3.5–5.5)
Alkaline Phosphatase: 115 IU/L (ref 39–117)
BUN/Creatinine Ratio: 9 (ref 9–20)
BUN: 8 mg/dL (ref 6–24)
CO2: 24 mmol/L (ref 18–29)
Calcium: 9.7 mg/dL (ref 8.7–10.2)
Chloride: 100 mmol/L (ref 97–108)
Creatinine, Ser: 0.86 mg/dL (ref 0.76–1.27)
GFR calc Af Amer: 118 mL/min/{1.73_m2} (ref >59)
GFR calc non Af Amer: 102 mL/min/{1.73_m2} (ref >59)
Globulin, Total: 2.6 g/dL (ref 1.5–4.5)
Glucose: 91 mg/dL (ref 65–99)
Potassium: 3.8 mmol/L (ref 3.5–5.2)
Sodium: 141 mmol/L (ref 134–144)
Total Bilirubin: 0.4 mg/dL (ref 0.0–1.2)
Total Protein: 7.4 g/dL (ref 6.0–8.5)

## 2013-07-26 LAB — PSA, TOTAL AND FREE
PSA, Free Pct: 10.8 %
PSA, Free: 0.27 ng/mL
PSA: 2.5 ng/mL (ref 0.0–4.0)

## 2013-08-08 ENCOUNTER — Telehealth: Payer: Self-pay | Admitting: Nurse Practitioner

## 2013-08-12 NOTE — Telephone Encounter (Signed)
Left detailed message, labwork looks good. Please continue with diet, exercise and medication.

## 2013-08-13 ENCOUNTER — Other Ambulatory Visit: Payer: Self-pay | Admitting: Nurse Practitioner

## 2013-08-15 ENCOUNTER — Other Ambulatory Visit: Payer: Self-pay | Admitting: Nurse Practitioner

## 2013-09-22 DIAGNOSIS — G5600 Carpal tunnel syndrome, unspecified upper limb: Secondary | ICD-10-CM | POA: Insufficient documentation

## 2014-02-04 ENCOUNTER — Other Ambulatory Visit: Payer: Self-pay | Admitting: Nurse Practitioner

## 2014-02-06 ENCOUNTER — Other Ambulatory Visit: Payer: Self-pay | Admitting: Nurse Practitioner

## 2014-02-13 ENCOUNTER — Other Ambulatory Visit: Payer: BC Managed Care – PPO

## 2014-02-13 ENCOUNTER — Telehealth: Payer: Self-pay | Admitting: Nurse Practitioner

## 2014-02-13 DIAGNOSIS — E785 Hyperlipidemia, unspecified: Secondary | ICD-10-CM

## 2014-02-13 DIAGNOSIS — R569 Unspecified convulsions: Secondary | ICD-10-CM

## 2014-02-13 DIAGNOSIS — I10 Essential (primary) hypertension: Secondary | ICD-10-CM

## 2014-02-14 LAB — NMR, LIPOPROFILE
Cholesterol: 124 mg/dL (ref 100–199)
HDL Cholesterol by NMR: 39 mg/dL — ABNORMAL LOW (ref >39)
HDL Particle Number: 29.7 umol/L — ABNORMAL LOW (ref >=30.5)
LDL Particle Number: 548 nmol/L (ref <1000)
LDL Size: 20.4 nm (ref >20.5)
LDL-C: 57 mg/dL (ref 0–99)
LP-IR Score: 63 — ABNORMAL HIGH (ref <=45)
Small LDL Particle Number: 275 nmol/L (ref <=527)
Triglycerides by NMR: 141 mg/dL (ref 0–149)

## 2014-02-14 LAB — CMP14+EGFR
ALT: 18 IU/L (ref 0–44)
AST: 25 IU/L (ref 0–40)
Albumin/Globulin Ratio: 2.1 (ref 1.1–2.5)
Albumin: 4.5 g/dL (ref 3.5–5.5)
Alkaline Phosphatase: 88 IU/L (ref 39–117)
BUN/Creatinine Ratio: 10 (ref 9–20)
BUN: 11 mg/dL (ref 6–24)
CO2: 23 mmol/L (ref 18–29)
Calcium: 9.1 mg/dL (ref 8.7–10.2)
Chloride: 104 mmol/L (ref 97–108)
Creatinine, Ser: 1.09 mg/dL (ref 0.76–1.27)
GFR calc Af Amer: 91 mL/min/{1.73_m2} (ref >59)
GFR calc non Af Amer: 79 mL/min/{1.73_m2} (ref >59)
Globulin, Total: 2.1 g/dL (ref 1.5–4.5)
Glucose: 112 mg/dL — ABNORMAL HIGH (ref 65–99)
Potassium: 3.9 mmol/L (ref 3.5–5.2)
Sodium: 142 mmol/L (ref 134–144)
Total Bilirubin: 0.3 mg/dL (ref 0.0–1.2)
Total Protein: 6.6 g/dL (ref 6.0–8.5)

## 2014-02-14 LAB — PHENOBARBITAL LEVEL: Phenobarbital Lvl: 14 ug/mL — ABNORMAL LOW (ref 15–40)

## 2014-02-16 ENCOUNTER — Other Ambulatory Visit: Payer: Self-pay

## 2014-02-16 ENCOUNTER — Telehealth: Payer: Self-pay | Admitting: Nurse Practitioner

## 2014-02-16 DIAGNOSIS — E785 Hyperlipidemia, unspecified: Secondary | ICD-10-CM

## 2014-02-16 NOTE — Telephone Encounter (Signed)
Pt aware of lab results ; Knew phenobarbital would be low-missed dose

## 2014-02-16 NOTE — Telephone Encounter (Signed)
Last seen 07/25/13  MMM Has upcoming appt in Dec  Wants printed to pick up

## 2014-02-16 NOTE — Telephone Encounter (Signed)
-----   Message from [facility], [place] sent at 02/14/2014  5:57 PM EST ----- Kidney and liver function stable Cholesterol looks great Phenobarbital level is low- who is neurologist so I can send results There?

## 2014-02-16 NOTE — Telephone Encounter (Signed)
[facility] to X-ray 

## 2014-02-16 NOTE — Telephone Encounter (Signed)
-----   Message from [facility], [place] sent at 02/14/2014  5:57 PM EST ----- Kidney and liver function stable Cholesterol looks great Phenobarbital level is low- who is neurologist so I can send results There?

## 2014-02-16 NOTE — Telephone Encounter (Signed)
Patient NTBS for follow up and lab work in order to get refills  

## 2014-03-05 ENCOUNTER — Other Ambulatory Visit: Payer: Self-pay | Admitting: Nurse Practitioner

## 2014-04-27 ENCOUNTER — Ambulatory Visit (INDEPENDENT_AMBULATORY_CARE_PROVIDER_SITE_OTHER): Payer: BLUE CROSS/BLUE SHIELD | Admitting: Family Medicine

## 2014-04-27 ENCOUNTER — Encounter: Payer: Self-pay | Admitting: Family Medicine

## 2014-04-27 VITALS — BP 136/88 | HR 69 | Temp 97.3°F | Ht 72.0 in | Wt 247.0 lb

## 2014-04-27 DIAGNOSIS — R569 Unspecified convulsions: Secondary | ICD-10-CM

## 2014-04-27 DIAGNOSIS — E785 Hyperlipidemia, unspecified: Secondary | ICD-10-CM

## 2014-04-27 DIAGNOSIS — I10 Essential (primary) hypertension: Secondary | ICD-10-CM

## 2014-04-27 MED ORDER — VALSARTAN 160 MG PO TABS: 160.0000 mg | ORAL_TABLET | Freq: Every day | ORAL | Status: DC

## 2014-04-27 NOTE — Progress Notes (Signed)
Subjective:  Patient ID: Christopher Salas, male    DOB: Sep 11, 1965  Age: 49 y.o. MRN: [id number]  CC: Hypertension; Hyperlipidemia; and Seizures   HPI Christopher Salas presents for follow-up of hypertension.  Patient in for follow-up of hypertension. Patient has no history of headache chest pain or shortness of breath or recent cough. Patient also denies symptoms of TIA such as numbness weakness lateralizing. Patient checks  blood pressure at home and has not had any elevated readings recently. Patient denies side effects from his medication. States taking it regularly.   Patient in for follow-up of elevated cholesterol. Doing well without complaints on current medication. Denies side effects of statin including myalgia and arthralgia and nausea. Also in today for liver function testing. Currently no chest pain, shortness of breath or other cardiovascular related symptoms noted.  Patient states he had no problem with seizures until he was taken off of his medicine several years ago for days later he sees. Since that time he has been back on his medicine tolerated it well and had no seizure activity since.  History Christopher Salas has a past medical history of Seizures; Hyperlipidemia; and Hypertension.   He has past surgical history that includes Hand surgery.   His family history includes Diabetes in his father; Emphysema in his mother.He reports that he has never smoked. He does not have any smokeless tobacco history on file. He reports that he does not drink alcohol or use illicit drugs.  Current Outpatient Prescriptions on File Prior to Visit  Medication Sig Dispense Refill  . niacin (NIASPAN) 500 MG CR tablet TAKE ONE TABLET BY MOUTH ONCE DAILY AT BEDTIME 30 tablet 1  . omega-3 acid ethyl esters (LOVAZA) 1 G capsule Take 2 capsules (2 g total) by mouth 2 (two) times daily. 120 capsule 5  . PHENObarbital (LUMINAL) 100 MG tablet TAKE ONE TABLET BY MOUTH ONCE DAILY 90 tablet 0  . phenobarbital  (LUMINAL) 16.2 MG tablet TAKE ONE TABLET BY MOUTH TWICE DAILY 180 tablet 0  . rosuvastatin (CRESTOR) 10 MG tablet Take 1 tablet (10 mg total) by mouth daily. 90 tablet 1   No current facility-administered medications on file prior to visit.    ROS Review of Systems  Constitutional: Negative for fever, chills, diaphoresis and unexpected weight change.  HENT: Negative for congestion, hearing loss, rhinorrhea, sore throat and trouble swallowing.   Respiratory: Negative for cough, chest tightness, shortness of breath and wheezing.   Gastrointestinal: Negative for nausea, vomiting, abdominal pain, diarrhea, constipation and abdominal distention.  Endocrine: Negative for cold intolerance and heat intolerance.  Genitourinary: Negative for dysuria, hematuria and flank pain.  Musculoskeletal: Negative for joint swelling and arthralgias.  Skin: Negative for rash.  Neurological: Negative for dizziness and headaches.  Psychiatric/Behavioral: Negative for dysphoric mood, decreased concentration and agitation. The patient is not nervous/anxious.     Objective:  BP 136/88 mmHg  Pulse 69  Temp(Src) 97.3 F (36.3 C) (Oral)  Ht 6' (1.829 m)  Wt 247 lb (112.038 kg)  BMI 33.49 kg/m2  BP Readings from Last 3 Encounters:  04/27/14 136/88  07/25/13 121/79  12/27/12 128/86    Wt Readings from Last 3 Encounters:  04/27/14 247 lb (112.038 kg)  07/25/13 233 lb (105.688 kg)  12/27/12 240 lb (108.863 kg)     Physical Exam  Constitutional: He is oriented to person, place, and time. He appears well-developed and well-nourished. No distress.  HENT:  Head: Normocephalic and atraumatic.  Right Ear: External ear  normal.  Left Ear: External ear normal.  Nose: Nose normal.  Mouth/Throat: Oropharynx is clear and moist.  Eyes: Conjunctivae and EOM are normal. Pupils are equal, round, and reactive to light.  Neck: Normal range of motion. Neck supple. No thyromegaly present.  Cardiovascular: Normal rate,  regular rhythm and normal heart sounds.   No murmur heard. Pulmonary/Chest: Effort normal and breath sounds normal. No respiratory distress. He has no wheezes. He has no rales.  Abdominal: Soft. Bowel sounds are normal. He exhibits no distension. There is no tenderness.  Lymphadenopathy:    He has no cervical adenopathy.  Neurological: He is alert and oriented to person, place, and time. He has normal reflexes.  Skin: Skin is warm and dry.  Psychiatric: He has a normal mood and affect. His behavior is normal. Judgment and thought content normal.    No results found for: HGBA1C  Lab Results  Component Value Date   GLUCOSE 112* 02/13/2014   CHOL 124 02/13/2014   TRIG 141 02/13/2014   HDL 39* 02/13/2014   LDLCALC 44 07/25/2013   ALT 18 02/13/2014   AST 25 02/13/2014   NA 142 02/13/2014   K 3.9 02/13/2014   CL 104 02/13/2014   CREATININE 1.09 02/13/2014   BUN 11 02/13/2014   CO2 23 02/13/2014   PSA 2.5 07/25/2013    No results found.  Assessment & Plan:   Christopher Salas was seen today for hypertension, hyperlipidemia and seizures.  Diagnoses and associated orders for this visit:  Essential hypertension - valsartan (DIOVAN) 160 MG tablet; Take 1 tablet (160 mg total) by mouth daily. - CMP14+EGFR  Hyperlipidemia - CMP14+EGFR - NMR, lipoprofile  Seizures    I have discontinued Christopher Salas omega-3 acid ethyl esters. I have also changed his valsartan. Additionally, I am having him maintain his omega-3 acid ethyl esters, rosuvastatin, phenobarbital, PHENObarbital, and niacin.  Meds ordered this encounter  Medications  . valsartan (DIOVAN) 160 MG tablet    Sig: Take 1 tablet (160 mg total) by mouth daily.    Dispense:  30 tablet    Refill:  5    Comments: For his seizures it was too soon to recheck his phenobarbital level considering it was normal just 2 months ago.  Follow-up: Return in about 3 months (around 07/26/2014).  Claretta Fraise, M.D.

## 2014-04-27 NOTE — Patient Instructions (Signed)
DASH Eating Plan °DASH stands for "Dietary Approaches to Stop Hypertension." The DASH eating plan is a healthy eating plan that has been shown to reduce high blood pressure (hypertension). Additional health benefits may include reducing the risk of type 2 diabetes mellitus, heart disease, and stroke. The DASH eating plan may also help with weight loss. °WHAT DO I NEED TO KNOW ABOUT THE DASH EATING PLAN? °For the DASH eating plan, you will follow these general guidelines: °· Choose foods with a percent daily value for sodium of less than 5% (as listed on the food label). °· Use salt-free seasonings or herbs instead of table salt or sea salt. °· Check with your health care provider or pharmacist before using salt substitutes. °· Eat lower-sodium products, often labeled as "lower sodium" or "no salt added." °· Eat fresh foods. °· Eat more vegetables, fruits, and low-fat dairy products. °· Choose whole grains. Look for the word "whole" as the first word in the ingredient list. °· Choose fish and skinless chicken or turkey more often than red meat. Limit fish, poultry, and meat to 6 oz (170 g) each day. °· Limit sweets, desserts, sugars, and sugary drinks. °· Choose heart-healthy fats. °· Limit cheese to 1 oz (28 g) per day. °· Eat more home-cooked food and less restaurant, buffet, and fast food. °· Limit fried foods. °· Cook foods using methods other than frying. °· Limit canned vegetables. If you do use them, rinse them well to decrease the sodium. °· When eating at a restaurant, ask that your food be prepared with less salt, or no salt if possible. °WHAT FOODS CAN I EAT? °Seek help from a dietitian for individual calorie needs. °Grains °Whole grain or whole wheat bread. Brown rice. Whole grain or whole wheat pasta. Quinoa, bulgur, and whole grain cereals. Low-sodium cereals. Corn or whole wheat flour tortillas. Whole grain cornbread. Whole grain crackers. Low-sodium crackers. °Vegetables °Fresh or frozen vegetables  (raw, steamed, roasted, or grilled). Low-sodium or reduced-sodium tomato and vegetable juices. Low-sodium or reduced-sodium tomato sauce and paste. Low-sodium or reduced-sodium canned vegetables.  °Fruits °All fresh, canned (in natural juice), or frozen fruits. °Meat and Other Protein Products °Ground beef (85% or leaner), grass-fed beef, or beef trimmed of fat. Skinless chicken or turkey. Ground chicken or turkey. Pork trimmed of fat. All fish and seafood. Eggs. Dried beans, peas, or lentils. Unsalted nuts and seeds. Unsalted canned beans. °Dairy °Low-fat dairy products, such as skim or 1% milk, 2% or reduced-fat cheeses, low-fat ricotta or cottage cheese, or plain low-fat yogurt. Low-sodium or reduced-sodium cheeses. °Fats and Oils °Tub margarines without trans fats. Light or reduced-fat mayonnaise and salad dressings (reduced sodium). Avocado. Safflower, olive, or canola oils. Natural peanut or almond butter. °Other °Unsalted popcorn and pretzels. °The items listed above may not be a complete list of recommended foods or beverages. Contact your dietitian for more options. °WHAT FOODS ARE NOT RECOMMENDED? °Grains °White bread. White pasta. White rice. Refined cornbread. Bagels and croissants. Crackers that contain trans fat. °Vegetables °Creamed or fried vegetables. Vegetables in a cheese sauce. Regular canned vegetables. Regular canned tomato sauce and paste. Regular tomato and vegetable juices. °Fruits °Dried fruits. Canned fruit in light or heavy syrup. Fruit juice. °Meat and Other Protein Products °Fatty cuts of meat. Ribs, chicken wings, bacon, sausage, bologna, salami, chitterlings, fatback, hot dogs, bratwurst, and packaged luncheon meats. Salted nuts and seeds. Canned beans with salt. °Dairy °Whole or 2% milk, cream, half-and-half, and cream cheese. Whole-fat or sweetened yogurt. Full-fat   cheeses or blue cheese. Nondairy creamers and whipped toppings. Processed cheese, cheese spreads, or cheese  curds. °Condiments °Onion and garlic salt, seasoned salt, table salt, and sea salt. Canned and packaged gravies. Worcestershire sauce. Tartar sauce. Barbecue sauce. Teriyaki sauce. Soy sauce, including reduced sodium. Steak sauce. Fish sauce. Oyster sauce. Cocktail sauce. Horseradish. Ketchup and mustard. Meat flavorings and tenderizers. Bouillon cubes. Hot sauce. Tabasco sauce. Marinades. Taco seasonings. Relishes. °Fats and Oils °Butter, stick margarine, lard, shortening, ghee, and bacon fat. Coconut, palm kernel, or palm oils. Regular salad dressings. °Other °Pickles and olives. Salted popcorn and pretzels. °The items listed above may not be a complete list of foods and beverages to avoid. Contact your dietitian for more information. °WHERE CAN I FIND MORE INFORMATION? °National [employer], [employer], [employer]: www.nhlbi.nih.gov/health/health-topics/topics/dash/ °Document Released: 02/23/2011 Document Revised: 07/21/2013 Document Reviewed: 01/08/2013 °Exit[employer]® Patient Information ©2015 [employer]. This information is not intended to replace advice given to you by your health care provider. Make sure you discuss any questions you have with your health care provider. ° °

## 2014-04-29 LAB — NMR, LIPOPROFILE
Cholesterol: 148 mg/dL (ref 100–199)
HDL Cholesterol by NMR: 39 mg/dL — ABNORMAL LOW (ref >39)
HDL Particle Number: 37.4 umol/L (ref >=30.5)
LDL Particle Number: 942 nmol/L (ref <1000)
LDL Size: 19.7 nm (ref >20.5)
LDL-C: 59 mg/dL (ref 0–99)
LP-IR Score: 66 — ABNORMAL HIGH (ref <=45)
Small LDL Particle Number: 668 nmol/L — ABNORMAL HIGH (ref <=527)
Triglycerides by NMR: 252 mg/dL — ABNORMAL HIGH (ref 0–149)

## 2014-04-29 LAB — CMP14+EGFR
ALT: 27 IU/L (ref 0–44)
AST: 28 IU/L (ref 0–40)
Albumin/Globulin Ratio: 1.8 (ref 1.1–2.5)
Albumin: 4.9 g/dL (ref 3.5–5.5)
Alkaline Phosphatase: 113 IU/L (ref 39–117)
BUN/Creatinine Ratio: 13 (ref 9–20)
BUN: 12 mg/dL (ref 6–24)
Bilirubin Total: 0.2 mg/dL (ref 0.0–1.2)
CO2: 26 mmol/L (ref 18–29)
Calcium: 9.8 mg/dL (ref 8.7–10.2)
Chloride: 101 mmol/L (ref 97–108)
Creatinine, Ser: 0.94 mg/dL (ref 0.76–1.27)
GFR calc Af Amer: 109 mL/min/{1.73_m2} (ref >59)
GFR calc non Af Amer: 94 mL/min/{1.73_m2} (ref >59)
Globulin, Total: 2.7 g/dL (ref 1.5–4.5)
Glucose: 92 mg/dL (ref 65–99)
Potassium: 5.1 mmol/L (ref 3.5–5.2)
Sodium: 141 mmol/L (ref 134–144)
Total Protein: 7.6 g/dL (ref 6.0–8.5)

## 2014-04-30 ENCOUNTER — Telehealth: Payer: Self-pay | Admitting: *Deleted

## 2014-04-30 NOTE — Telephone Encounter (Signed)
-----   Message from Claretta Fraise, MD sent at 04/30/2014  2:05 PM EST ----- Leveda Anna,    Your lab result is normal.Some minor variations that are not significant are commonly marked abnormal, but do not represent any medical problem for you.  Best regards, Claretta Fraise, M.D.

## 2014-04-30 NOTE — Telephone Encounter (Signed)
lmtcb regarding test results. 

## 2014-05-01 ENCOUNTER — Other Ambulatory Visit: Payer: Self-pay | Admitting: Nurse Practitioner

## 2014-05-05 ENCOUNTER — Other Ambulatory Visit: Payer: Self-pay | Admitting: Family Medicine

## 2014-05-05 DIAGNOSIS — E785 Hyperlipidemia, unspecified: Secondary | ICD-10-CM

## 2014-05-06 MED ORDER — OMEGA-3-ACID ETHYL ESTERS 1 G PO CAPS: 2.0000 g | ORAL_CAPSULE | Freq: Two times a day (BID) | ORAL | Status: DC

## 2014-05-06 MED ORDER — PHENOBARBITAL 100 MG PO TABS: 100.0000 mg | ORAL_TABLET | Freq: Every day | ORAL | Status: DC

## 2014-05-06 MED ORDER — ROSUVASTATIN CALCIUM 10 MG PO TABS: 10.0000 mg | ORAL_TABLET | Freq: Every day | ORAL | Status: DC

## 2014-05-06 MED ORDER — PHENOBARBITAL 16.2 MG PO TABS: 16.2000 mg | ORAL_TABLET | Freq: Two times a day (BID) | ORAL | Status: DC

## 2014-05-06 NOTE — Telephone Encounter (Signed)
Left message on pt's voicemail that written RF's are ready for pickup here at office and other two refills are at [facility]

## 2014-05-07 ENCOUNTER — Telehealth: Payer: Self-pay | Admitting: Family Medicine

## 2014-06-02 ENCOUNTER — Other Ambulatory Visit: Payer: Self-pay

## 2014-06-02 MED ORDER — NIACIN ER (ANTIHYPERLIPIDEMIC) 500 MG PO TBCR: 500.0000 mg | EXTENDED_RELEASE_TABLET | Freq: Every day | ORAL | Status: DC

## 2014-08-02 ENCOUNTER — Other Ambulatory Visit: Payer: Self-pay | Admitting: Family Medicine

## 2014-11-02 ENCOUNTER — Other Ambulatory Visit: Payer: Self-pay | Admitting: Family Medicine

## 2014-11-02 ENCOUNTER — Telehealth: Payer: Self-pay | Admitting: Nurse Practitioner

## 2014-11-02 ENCOUNTER — Other Ambulatory Visit: Payer: Self-pay | Admitting: Nurse Practitioner

## 2014-11-02 ENCOUNTER — Other Ambulatory Visit: Payer: Self-pay | Admitting: *Deleted

## 2014-11-02 DIAGNOSIS — E785 Hyperlipidemia, unspecified: Secondary | ICD-10-CM

## 2014-11-02 MED ORDER — OMEGA-3-ACID ETHYL ESTERS 1 G PO CAPS: 2.0000 g | ORAL_CAPSULE | Freq: Two times a day (BID) | ORAL | Status: DC

## 2014-11-02 MED ORDER — PHENOBARBITAL 16.2 MG PO TABS: 16.2000 mg | ORAL_TABLET | Freq: Two times a day (BID) | ORAL | Status: DC

## 2014-11-02 MED ORDER — PHENOBARBITAL 100 MG PO TABS: 100.0000 mg | ORAL_TABLET | Freq: Every day | ORAL | Status: DC

## 2014-11-02 NOTE — Telephone Encounter (Signed)
Requests sent back to provider for approval

## 2014-11-02 NOTE — Telephone Encounter (Signed)
Okay for 1 month, but must be seen prior to further refills.

## 2014-11-05 ENCOUNTER — Ambulatory Visit (INDEPENDENT_AMBULATORY_CARE_PROVIDER_SITE_OTHER): Payer: BLUE CROSS/BLUE SHIELD | Admitting: Family Medicine

## 2014-11-05 ENCOUNTER — Encounter: Payer: Self-pay | Admitting: Family Medicine

## 2014-11-05 VITALS — BP 130/87 | HR 76 | Temp 97.1°F | Ht 72.0 in | Wt 230.8 lb

## 2014-11-05 DIAGNOSIS — I10 Essential (primary) hypertension: Secondary | ICD-10-CM | POA: Diagnosis not present

## 2014-11-05 DIAGNOSIS — E785 Hyperlipidemia, unspecified: Secondary | ICD-10-CM | POA: Diagnosis not present

## 2014-11-05 MED ORDER — NIACIN ER (ANTIHYPERLIPIDEMIC) 500 MG PO TBCR: EXTENDED_RELEASE_TABLET | ORAL | Status: DC

## 2014-11-05 MED ORDER — PHENOBARBITAL 100 MG PO TABS: 100.0000 mg | ORAL_TABLET | Freq: Every day | ORAL | Status: DC

## 2014-11-05 MED ORDER — ROSUVASTATIN CALCIUM 10 MG PO TABS: 10.0000 mg | ORAL_TABLET | Freq: Every day | ORAL | Status: DC

## 2014-11-05 MED ORDER — OMEGA-3-ACID ETHYL ESTERS 1 G PO CAPS: 2.0000 g | ORAL_CAPSULE | Freq: Two times a day (BID) | ORAL | Status: DC

## 2014-11-05 MED ORDER — VALSARTAN 160 MG PO TABS: 160.0000 mg | ORAL_TABLET | Freq: Every day | ORAL | Status: DC

## 2014-11-05 MED ORDER — PHENOBARBITAL 16.2 MG PO TABS: 16.2000 mg | ORAL_TABLET | Freq: Two times a day (BID) | ORAL | Status: DC

## 2014-11-05 NOTE — Patient Instructions (Signed)
Hypertension Hypertension, commonly called high blood pressure, is when the force of blood pumping through your arteries is too strong. Your arteries are the blood vessels that carry blood from your heart throughout your body. A blood pressure reading consists of a higher number over a lower number, such as 110/72. The higher number (systolic) is the pressure inside your arteries when your heart pumps. The lower number (diastolic) is the pressure inside your arteries when your heart relaxes. Ideally you want your blood pressure below 120/80. Hypertension forces your heart to work harder to pump blood. Your arteries may become narrow or stiff. Having hypertension puts you at risk for heart disease, stroke, and other problems.  RISK FACTORS Some risk factors for high blood pressure are controllable. Others are not.  Risk factors you cannot control include:   Race. You may be at higher risk if you are African American.  Age. Risk increases with age.  Gender. Men are at higher risk than women before age 45 years. After age 65, women are at higher risk than men. Risk factors you can control include:  Not getting enough exercise or physical activity.  Being overweight.  Getting too much fat, sugar, calories, or salt in your diet.  Drinking too much alcohol. SIGNS AND SYMPTOMS Hypertension does not usually cause signs or symptoms. Extremely high blood pressure (hypertensive crisis) may cause headache, anxiety, shortness of breath, and nosebleed. DIAGNOSIS  To check if you have hypertension, your health care provider will measure your blood pressure while you are seated, with your arm held at the level of your heart. It should be measured at least twice using the same arm. Certain conditions can cause a difference in blood pressure between your right and left arms. A blood pressure reading that is higher than normal on one occasion does not mean that you need treatment. If one blood pressure reading  is high, ask your health care provider about having it checked again. TREATMENT  Treating high blood pressure includes making lifestyle changes and possibly taking medicine. Living a healthy lifestyle can help lower high blood pressure. You may need to change some of your habits. Lifestyle changes may include:  Following the DASH diet. This diet is high in fruits, vegetables, and whole grains. It is low in salt, red meat, and added sugars.  Getting at least 2 hours of brisk physical activity every week.  Losing weight if necessary.  Not smoking.  Limiting alcoholic beverages.  Learning ways to reduce stress. If lifestyle changes are not enough to get your blood pressure under control, your health care provider may prescribe medicine. You may need to take more than one. Work closely with your health care provider to understand the risks and benefits. HOME CARE INSTRUCTIONS  Have your blood pressure rechecked as directed by your health care provider.   Take medicines only as directed by your health care provider. Follow the directions carefully. Blood pressure medicines must be taken as prescribed. The medicine does not work as well when you skip doses. Skipping doses also puts you at risk for problems.   Do not smoke.   Monitor your blood pressure at home as directed by your health care provider. SEEK MEDICAL CARE IF:   You think you are having a reaction to medicines taken.  You have recurrent headaches or feel dizzy.  You have swelling in your ankles.  You have trouble with your vision. SEEK IMMEDIATE MEDICAL CARE IF:  You develop a severe headache or confusion.    You have unusual weakness, numbness, or feel faint.  You have severe chest or abdominal pain.  You vomit repeatedly.  You have trouble breathing. MAKE SURE YOU:   Understand these instructions.  Will watch your condition.  Will get help right away if you are not doing well or get worse. Document  Released: 03/06/2005 Document Revised: 07/21/2013 Document Reviewed: 12/27/2012 [facility] Patient Information 2015 [place], [place]. This information is not intended to replace advice given to you by your health care provider. Make sure you discuss any questions you have with your health care provider.  Colonoscopy A colonoscopy is an exam to look at the entire large intestine (colon). This exam can help find problems such as tumors, polyps, inflammation, and areas of bleeding. The exam takes about 1 hour.  LET [facility] CARE PROVIDER KNOW ABOUT:   Any allergies you have.  All medicines you are taking, including vitamins, herbs, eye drops, creams, and over-the-counter medicines.  Previous problems you or members of your family have had with the use of anesthetics.  Any blood disorders you have.  Previous surgeries you have had.  Medical conditions you have. RISKS AND COMPLICATIONS  Generally, this is a safe procedure. However, as with any procedure, complications can occur. Possible complications include:  Bleeding.  Tearing or rupture of the colon wall.  Reaction to medicines given during the exam.  Infection (rare). BEFORE THE PROCEDURE   Ask your health care provider about changing or stopping your regular medicines.  You may be prescribed an oral bowel prep. This involves drinking a large amount of medicated liquid, starting the day before your procedure. The liquid will cause you to have multiple loose stools until your stool is almost clear or light green. This cleans out your colon in preparation for the procedure.  Do not eat or drink anything else once you have started the bowel prep, unless your health care provider tells you it is safe to do so.  Arrange for someone to drive you home after the procedure. PROCEDURE   You will be given medicine to help you relax (sedative).  You will lie on your side with your knees bent.  A long, flexible tube with a light and  camera on the end (colonoscope) will be inserted through the rectum and into the colon. The camera sends video back to a computer screen as it moves through the colon. The colonoscope also releases carbon dioxide gas to inflate the colon. This helps your health care provider see the area better.  During the exam, your health care provider may take a small tissue sample (biopsy) to be examined under a microscope if any abnormalities are found.  The exam is finished when the entire colon has been viewed. AFTER THE PROCEDURE   Do not drive for 24 hours after the exam.  You may have a small amount of blood in your stool.  You may pass moderate amounts of gas and have mild abdominal cramping or bloating. This is caused by the gas used to inflate your colon during the exam.  Ask when your test results will be ready and how you will get your results. Make sure you get your test results. Document Released: 03/03/2000 Document Revised: 12/25/2012 Document Reviewed: 11/11/2012 [facility] Patient Information 2015 [place], [place]. This information is not intended to replace advice given to you by your health care provider. Make sure you discuss any questions you have with your health care provider.

## 2014-11-05 NOTE — Progress Notes (Signed)
BP 130/87 mmHg  Pulse 76  Temp(Src) 97.1 F (36.2 C) (Oral)  Ht 6' (1.829 m)  Wt 230 lb 12.8 oz (104.69 kg)  BMI 31.30 kg/m2   Subjective:    Patient ID: Christopher Salas, male    DOB: [birth year], 49 y.o.   MRN: [id number]  HPI: Christopher Salas is a 49 y.o. male presenting on 11/05/2014 for Medication Refill and 6 month follow up chronic medical conditions   HPI Hypertension Patient presents for a blood pressure recheck and he is currently taking valsartan 160 mg. He denies any major issues or side effects with medication. His blood pressure today is 130/87. He denies any headaches, chest pains, blurred vision, or shortness of breath.  Hyperlipidemia Patient also presents for a cholesterol recheck he is currently taking Crestor 10 mg and omega-3 and niacin. He denies any major side effects from these medications and is pretty happy with them. Denies any chest pain or shortness of breath. We'll recheck labs today.  Relevant past medical, surgical, family and social history reviewed and updated as indicated. Interim medical history since our last visit reviewed. Allergies and medications reviewed and updated.  Review of Systems  Constitutional: Negative for fever and chills.  HENT: Negative for ear discharge and ear pain.   Eyes: Negative for discharge and visual disturbance.  Respiratory: Negative for shortness of breath and wheezing.   Cardiovascular: Negative for chest pain, palpitations and leg swelling.  Gastrointestinal: Negative for abdominal pain, diarrhea and constipation.  Genitourinary: Negative for difficulty urinating.  Musculoskeletal: Negative for back pain and gait problem.  Skin: Negative for rash.  Neurological: Negative for dizziness, syncope, weakness, light-headedness and headaches.  All other systems reviewed and are negative.   Per HPI unless specifically indicated above     Medication List       This list is accurate as of: 11/05/14  4:56 PM.  Always  use your most recent med list.               niacin 500 MG CR tablet  Commonly known as:  NIASPAN  TAKE ONE TABLET BY MOUTH ONCE DAILY WITH  BREAKFAST     omega-3 acid ethyl esters 1 G capsule  Commonly known as:  LOVAZA  Take 2 capsules (2 g total) by mouth 2 (two) times daily.     phenobarbital 16.2 MG tablet  Commonly known as:  LUMINAL  Take 1 tablet (16.2 mg total) by mouth 2 (two) times daily.     PHENObarbital 100 MG tablet  Commonly known as:  LUMINAL  Take 1 tablet (100 mg total) by mouth daily.     rosuvastatin 10 MG tablet  Commonly known as:  CRESTOR  Take 1 tablet (10 mg total) by mouth daily.     valsartan 160 MG tablet  Commonly known as:  DIOVAN  Take 1 tablet (160 mg total) by mouth daily.           Objective:    BP 130/87 mmHg  Pulse 76  Temp(Src) 97.1 F (36.2 C) (Oral)  Ht 6' (1.829 m)  Wt 230 lb 12.8 oz (104.69 kg)  BMI 31.30 kg/m2  Wt Readings from Last 3 Encounters:  11/05/14 230 lb 12.8 oz (104.69 kg)  04/27/14 247 lb (112.038 kg)  07/25/13 233 lb (105.688 kg)    Physical Exam  Constitutional: He is oriented to person, place, and time. He appears well-developed and well-nourished. No distress.  Eyes: Conjunctivae and EOM are normal.  Pupils are equal, round, and reactive to light. Right eye exhibits no discharge. No scleral icterus.  Cardiovascular: Normal rate, regular rhythm, normal heart sounds and intact distal pulses.   No murmur heard. Pulmonary/Chest: Effort normal and breath sounds normal. No respiratory distress. He has no wheezes.  Abdominal: He exhibits no distension.  Musculoskeletal: Normal range of motion. He exhibits no edema.  Neurological: He is alert and oriented to person, place, and time. Coordination normal.  Skin: Skin is warm and dry. No rash noted. He is not diaphoretic.  Psychiatric: He has a normal mood and affect. His behavior is normal.  Vitals reviewed.   Results for orders placed or performed in visit  on 04/27/14  CMP14+EGFR  Result Value Ref Range   Glucose 92 65 - 99 mg/dL   BUN 12 6 - 24 mg/dL   Creatinine, Ser 0.94 0.76 - 1.27 mg/dL   GFR calc non Af Amer 94 >59 mL/min/1.73   GFR calc Af Amer 109 >59 mL/min/1.73   BUN/Creatinine Ratio 13 9 - 20   Sodium 141 134 - 144 mmol/L   Potassium 5.1 3.5 - 5.2 mmol/L   Chloride 101 97 - 108 mmol/L   CO2 26 18 - 29 mmol/L   Calcium 9.8 8.7 - 10.2 mg/dL   Total Protein 7.6 6.0 - 8.5 g/dL   Albumin 4.9 3.5 - 5.5 g/dL   Globulin, Total 2.7 1.5 - 4.5 g/dL   Albumin/Globulin Ratio 1.8 1.1 - 2.5   Bilirubin Total 0.2 0.0 - 1.2 mg/dL   Alkaline Phosphatase 113 39 - 117 IU/L   AST 28 0 - 40 IU/L   ALT 27 0 - 44 IU/L  NMR, lipoprofile  Result Value Ref Range   LDL Particle Number [id number] <1000 nmol/L   LDL-C 59 0 - 99 mg/dL   HDL Cholesterol by NMR 39 (L) >39 mg/dL   Triglycerides by NMR 252 (H) 0 - 149 mg/dL   Cholesterol 148 100 - 199 mg/dL   HDL Particle Number 37.4 >=30.5 umol/L   Small LDL Particle Number 668 (H) <=527 nmol/L   LDL Size 19.7 >20.5 nm   LP-IR Score 66 (H) <=45      Assessment & Plan:   Problem List Items Addressed This Visit      Cardiovascular and Mediastinum   Hypertension - Primary    Controlled, continue medications      Relevant Medications   niacin (NIASPAN) 500 MG CR tablet   omega-3 acid ethyl esters (LOVAZA) 1 G capsule   rosuvastatin (CRESTOR) 10 MG tablet   valsartan (DIOVAN) 160 MG tablet   Other Relevant Orders   BMP8+EGFR (Completed)   Microalbumin, urine (Completed)     Other   Hyperlipidemia    Will recheck cholesterol today.      Relevant Medications   niacin (NIASPAN) 500 MG CR tablet   omega-3 acid ethyl esters (LOVAZA) 1 G capsule   rosuvastatin (CRESTOR) 10 MG tablet   valsartan (DIOVAN) 160 MG tablet   Other Relevant Orders   Lipid panel (Completed)       Follow up plan: Return in about 6 months (around 05/08/2015), or if symptoms worsen or fail to improve.  Caryl Pina, MD [facility] 11/05/2014, 4:56 PM

## 2014-11-05 NOTE — Assessment & Plan Note (Signed)
Controlled, continue medications

## 2014-11-05 NOTE — Assessment & Plan Note (Signed)
Will recheck cholesterol today.

## 2014-11-06 LAB — BMP8+EGFR
BUN/Creatinine Ratio: 7 — ABNORMAL LOW (ref 9–20)
BUN: 7 mg/dL (ref 6–24)
CO2: 22 mmol/L (ref 18–29)
Calcium: 9.8 mg/dL (ref 8.7–10.2)
Chloride: 101 mmol/L (ref 97–108)
Creatinine, Ser: 1.03 mg/dL (ref 0.76–1.27)
GFR calc Af Amer: 97 mL/min/{1.73_m2} (ref >59)
GFR calc non Af Amer: 84 mL/min/{1.73_m2} (ref >59)
Glucose: 90 mg/dL (ref 65–99)
Potassium: 3.7 mmol/L (ref 3.5–5.2)
Sodium: 141 mmol/L (ref 134–144)

## 2014-11-06 LAB — LIPID PANEL
Chol/HDL Ratio: 4.1 ratio units (ref 0.0–5.0)
Cholesterol, Total: 140 mg/dL (ref 100–199)
HDL: 34 mg/dL — ABNORMAL LOW (ref >39)
LDL Calculated: 69 mg/dL (ref 0–99)
Triglycerides: 186 mg/dL — ABNORMAL HIGH (ref 0–149)
VLDL Cholesterol Cal: 37 mg/dL (ref 5–40)

## 2014-11-06 LAB — MICROALBUMIN, URINE: Microalbumin, Urine: <3 ug/mL

## 2015-01-12 ENCOUNTER — Other Ambulatory Visit: Payer: Self-pay

## 2015-01-12 DIAGNOSIS — I10 Essential (primary) hypertension: Secondary | ICD-10-CM

## 2015-01-12 DIAGNOSIS — E785 Hyperlipidemia, unspecified: Secondary | ICD-10-CM

## 2015-01-12 MED ORDER — OMEGA-3-ACID ETHYL ESTERS 1 G PO CAPS: 2.0000 g | ORAL_CAPSULE | Freq: Two times a day (BID) | ORAL | Status: DC

## 2015-01-12 MED ORDER — NIACIN ER (ANTIHYPERLIPIDEMIC) 500 MG PO TBCR: EXTENDED_RELEASE_TABLET | ORAL | Status: DC

## 2015-01-12 MED ORDER — VALSARTAN 160 MG PO TABS: 160.0000 mg | ORAL_TABLET | Freq: Every day | ORAL | Status: DC

## 2015-01-12 MED ORDER — ROSUVASTATIN CALCIUM 10 MG PO TABS: 10.0000 mg | ORAL_TABLET | Freq: Every day | ORAL | Status: DC

## 2015-01-21 ENCOUNTER — Telehealth: Payer: Self-pay | Admitting: Family Medicine

## 2015-01-25 ENCOUNTER — Other Ambulatory Visit: Payer: Self-pay

## 2015-01-25 MED ORDER — PHENOBARBITAL 16.2 MG PO TABS: 16.2000 mg | ORAL_TABLET | Freq: Two times a day (BID) | ORAL | Status: DC

## 2015-01-25 MED ORDER — PHENOBARBITAL 100 MG PO TABS: 100.0000 mg | ORAL_TABLET | Freq: Every day | ORAL | Status: DC

## 2015-01-25 NOTE — Telephone Encounter (Signed)
error 

## 2015-01-25 NOTE — Telephone Encounter (Signed)
Must print, cant send electronically

## 2015-01-25 NOTE — Telephone Encounter (Signed)
Okay to print and refill

## 2015-01-25 NOTE — Telephone Encounter (Signed)
Prescription placed at front desk for pick up, left message on patient's voicemail to inform them of this

## 2015-01-27 NOTE — Telephone Encounter (Signed)
Prescription was printed, placed up front and patient notified

## 2015-02-16 ENCOUNTER — Telehealth: Payer: Self-pay | Admitting: Nurse Practitioner

## 2015-02-16 NOTE — Telephone Encounter (Signed)
denied °

## 2015-03-24 ENCOUNTER — Other Ambulatory Visit: Payer: Self-pay | Admitting: Family Medicine

## 2015-03-26 ENCOUNTER — Encounter: Payer: Self-pay | Admitting: Family Medicine

## 2015-03-26 ENCOUNTER — Ambulatory Visit (INDEPENDENT_AMBULATORY_CARE_PROVIDER_SITE_OTHER): Payer: BLUE CROSS/BLUE SHIELD | Admitting: Family Medicine

## 2015-03-26 VITALS — BP 124/79 | HR 57 | Temp 97.0°F | Ht 72.0 in | Wt 224.8 lb

## 2015-03-26 DIAGNOSIS — I10 Essential (primary) hypertension: Secondary | ICD-10-CM | POA: Diagnosis not present

## 2015-03-26 DIAGNOSIS — R569 Unspecified convulsions: Secondary | ICD-10-CM

## 2015-03-26 DIAGNOSIS — E785 Hyperlipidemia, unspecified: Secondary | ICD-10-CM

## 2015-03-26 MED ORDER — OMEGA-3-ACID ETHYL ESTERS 1 G PO CAPS: 2.0000 | ORAL_CAPSULE | Freq: Two times a day (BID) | ORAL | Status: DC

## 2015-03-26 MED ORDER — ROSUVASTATIN CALCIUM 10 MG PO TABS: 10.0000 mg | ORAL_TABLET | Freq: Every day | ORAL | Status: DC

## 2015-03-26 MED ORDER — PHENOBARBITAL 16.2 MG PO TABS
16.2000 mg | ORAL_TABLET | Freq: Two times a day (BID) | ORAL | Status: DC
Start: 2015-03-26 — End: 2015-09-08

## 2015-03-26 MED ORDER — VALSARTAN 160 MG PO TABS: 160.0000 mg | ORAL_TABLET | Freq: Every day | ORAL | Status: DC

## 2015-03-26 MED ORDER — NIACIN ER (ANTIHYPERLIPIDEMIC) 500 MG PO TBCR: 500.0000 mg | EXTENDED_RELEASE_TABLET | Freq: Every day | ORAL | Status: DC

## 2015-03-26 MED ORDER — PHENOBARBITAL 100 MG PO TABS: 100.0000 mg | ORAL_TABLET | Freq: Every day | ORAL | Status: DC

## 2015-03-26 NOTE — Progress Notes (Signed)
BP 124/79 mmHg  Pulse 57  Temp(Src) 97 F (36.1 C) (Oral)  Ht 6' (1.829 m)  Wt 224 lb 12.8 oz (101.969 kg)  BMI 30.48 kg/m2   Subjective:    Patient ID: Christopher Salas, male    DOB: 08-Dec-1965, 50 y.o.   MRN: [id number]  HPI: Christopher Salas is a 50 y.o. male presenting on 03/26/2015 for Medication Refill   HPI Hypertension recheck Patient is on Diovan for hypertension and has been for some time. He has been stable on this dose and his blood pressure is well-controlled in the office today at 124/79. Patient denies headaches, blurred vision, chest pains, shortness of breath, or weakness. Denies any side effects from medication and is content with current medication.   Hyperlipidemia recheck Patient is taking niacin and omega-3's and Crestor and has been doing well on all 3 and denies any side effects from the medication. He denies any myalgias or liver problems that he knows of. His last check for cholesterol was in August 2016 look to be mostly controlled except for triglycerides of 186 which was much improved from what it was previously.  Seizure disorder Patient has been on phenobarbital for his seizure disorder for quite some time. He used to see a neurologist for this but they cleared him because he been stable for so long. His last seizure was 22 years ago. Discuss whether or not to come off and currently he has opted towards not because is been so stable on the dose. He denies any side effects from the medication that he is aware of.  Relevant past medical, surgical, family and social history reviewed and updated as indicated. Interim medical history since our last visit reviewed. Allergies and medications reviewed and updated.  Review of Systems  Constitutional: Negative for fever.  HENT: Negative for ear discharge and ear pain.   Eyes: Negative for discharge and visual disturbance.  Respiratory: Negative for shortness of breath and wheezing.   Cardiovascular: Negative for chest  pain and leg swelling.  Gastrointestinal: Negative for abdominal pain, diarrhea and constipation.  Genitourinary: Negative for difficulty urinating.  Musculoskeletal: Negative for back pain and gait problem.  Skin: Negative for rash.  Neurological: Negative for dizziness, seizures, syncope, speech difficulty, weakness, light-headedness, numbness and headaches.  All other systems reviewed and are negative.   Per HPI unless specifically indicated above     Medication List       This list is accurate as of: 03/26/15  4:34 PM.  Always use your most recent med list.               niacin 500 MG CR tablet  Commonly known as:  NIASPAN  Take 1 tablet (500 mg total) by mouth daily with breakfast.     omega-3 acid ethyl esters 1 g capsule  Commonly known as:  LOVAZA  Take 2 capsules (2 g total) by mouth 2 (two) times daily.     PHENObarbital 100 MG tablet  Commonly known as:  LUMINAL  Take 1 tablet (100 mg total) by mouth daily.     phenobarbital 16.2 MG tablet  Commonly known as:  LUMINAL  Take 1 tablet (16.2 mg total) by mouth 2 (two) times daily.     rosuvastatin 10 MG tablet  Commonly known as:  CRESTOR  Take 1 tablet (10 mg total) by mouth daily.     valsartan 160 MG tablet  Commonly known as:  DIOVAN  Take 1 tablet (160 mg  total) by mouth daily.           Objective:    BP 124/79 mmHg  Pulse 57  Temp(Src) 97 F (36.1 C) (Oral)  Ht 6' (1.829 m)  Wt 224 lb 12.8 oz (101.969 kg)  BMI 30.48 kg/m2  Wt Readings from Last 3 Encounters:  03/26/15 224 lb 12.8 oz (101.969 kg)  11/05/14 230 lb 12.8 oz (104.69 kg)  04/27/14 247 lb (112.038 kg)    Physical Exam  Constitutional: He is oriented to person, place, and time. He appears well-developed and well-nourished. No distress.  Eyes: Conjunctivae and EOM are normal. Pupils are equal, round, and reactive to light. Right eye exhibits no discharge. No scleral icterus.  Neck: Neck supple. No thyromegaly present.    Cardiovascular: Normal rate, regular rhythm, normal heart sounds and intact distal pulses.   No murmur heard. Pulmonary/Chest: Effort normal and breath sounds normal. No respiratory distress. He has no wheezes.  Musculoskeletal: Normal range of motion. He exhibits no edema.  Lymphadenopathy:    He has no cervical adenopathy.  Neurological: He is alert and oriented to person, place, and time. No cranial nerve deficit. Coordination normal.  Skin: Skin is warm and dry. No rash noted. He is not diaphoretic.  Psychiatric: He has a normal mood and affect. His behavior is normal.  Vitals reviewed.   Results for orders placed or performed in visit on 11/05/14  [facility]  Result Value Ref Range   Glucose 90 65 - 99 mg/dL   BUN 7 6 - 24 mg/dL   Creatinine, Ser 5.39 0.76 - 1.27 mg/dL   GFR calc non Af Amer 84 >59 mL/min/1.73   GFR calc Af Amer 97 >59 mL/min/1.73   BUN/Creatinine Ratio 7 (L) 9 - 20   Sodium 141 134 - 144 mmol/L   Potassium 3.7 3.5 - 5.2 mmol/L   Chloride 101 97 - 108 mmol/L   CO2 22 18 - 29 mmol/L   Calcium 9.8 8.7 - 10.2 mg/dL  Lipid panel  Result Value Ref Range   Cholesterol, Total 140 100 - 199 mg/dL   Triglycerides 672 (H) 0 - 149 mg/dL   HDL 34 (L) >89 mg/dL   VLDL Cholesterol Cal 37 5 - 40 mg/dL   LDL Calculated 69 0 - 99 mg/dL   Chol/HDL Ratio 4.1 0.0 - 5.0 ratio units  Microalbumin, urine  Result Value Ref Range   Microalbum.,U,Random <3.0 Not Estab. ug/mL      Assessment & Plan:   Problem List Items Addressed This Visit      Cardiovascular and Mediastinum   Hypertension - Primary   Relevant Medications   niacin (NIASPAN) 500 MG CR tablet   rosuvastatin (CRESTOR) 10 MG tablet   valsartan (DIOVAN) 160 MG tablet   omega-3 acid ethyl esters (LOVAZA) 1 g capsule     Other   Hyperlipidemia   Relevant Medications   niacin (NIASPAN) 500 MG CR tablet   rosuvastatin (CRESTOR) 10 MG tablet   valsartan (DIOVAN) 160 MG tablet   omega-3 acid ethyl esters  (LOVAZA) 1 g capsule   Seizures (HCC)   Relevant Medications   PHENObarbital (LUMINAL) 100 MG tablet   phenobarbital (LUMINAL) 16.2 MG tablet       Follow up plan: Return in about 6 months (around 09/23/2015), or if symptoms worsen or fail to improve, for Follow-up hypertension and hyperlipidemia and seizures.  Counseling provided for all of the vaccine components No orders of the defined types were placed  in this encounter.    Caryl Pina, MD [facility] 03/26/2015, 4:34 PM

## 2015-05-10 ENCOUNTER — Telehealth: Payer: Self-pay | Admitting: Family Medicine

## 2015-05-10 NOTE — Telephone Encounter (Signed)
Printed and gave to Debbi 

## 2015-05-11 ENCOUNTER — Telehealth: Payer: Self-pay

## 2015-05-11 NOTE — Telephone Encounter (Signed)
Tried to do prior authorization for patient for Lovaza and it came back this drug not covered by his plan

## 2015-05-12 ENCOUNTER — Other Ambulatory Visit: Payer: Self-pay | Admitting: Family Medicine

## 2015-05-12 MED ORDER — ICOSAPENT ETHYL 1 G PO CAPS
2.0000 | ORAL_CAPSULE | Freq: Two times a day (BID) | ORAL | Status: DC
Start: 2015-05-12 — End: 2017-04-16

## 2015-05-12 NOTE — Telephone Encounter (Signed)
No answer

## 2015-05-12 NOTE — Telephone Encounter (Signed)
lmtcb

## 2015-05-13 NOTE — Telephone Encounter (Signed)
Pt aware alternative medication sent to his mail order pharmacy

## 2015-05-13 NOTE — Telephone Encounter (Signed)
t aware alternative medication sent to mail order pharmacy

## 2015-05-24 ENCOUNTER — Telehealth: Payer: Self-pay | Admitting: Family Medicine

## 2015-05-24 NOTE — Telephone Encounter (Signed)
Is this alternative ok with you and if so, which strength? Route to pool a

## 2015-05-25 ENCOUNTER — Telehealth: Payer: Self-pay | Admitting: Family Medicine

## 2015-05-25 NOTE — Telephone Encounter (Signed)
Patient's triglyceride is not elevated enough to need fenofibrate, if he wants to he can buy an over-the-counter omega-3, I am surprised that none of them will be covered. Caryl Pina, MD [facility] 05/25/2015, 8:19 AM

## 2015-05-25 NOTE — Telephone Encounter (Signed)
Please give [place] more details so we can help .

## 2015-05-25 NOTE — Telephone Encounter (Signed)
Patient aware to take OTC fish oil.

## 2015-05-27 ENCOUNTER — Telehealth: Payer: Self-pay | Admitting: Family Medicine

## 2015-05-27 MED ORDER — FENOFIBRATE 48 MG PO TABS: 48.0000 mg | ORAL_TABLET | Freq: Every day | ORAL | Status: DC

## 2015-05-27 NOTE — Addendum Note (Signed)
Addended by: Caryl Pina on: 05/27/2015 02:45 PM   Modules accepted: Orders

## 2015-05-27 NOTE — Telephone Encounter (Signed)
Had over 200 previously and I saw that now and went ahead and send the fenofibrate to see how he does with that. Caryl Pina, MD [facility] 05/27/2015, 2:44 PM

## 2015-05-27 NOTE — Telephone Encounter (Signed)
lmtcb

## 2015-05-27 NOTE — Telephone Encounter (Signed)
Please see telephone call from 3/6 this encounter will be closed.

## 2015-05-27 NOTE — Telephone Encounter (Signed)
Patient is very concerned with not taking lovaza that his triglycerides will go back up. His insurance is denying the lovaza and he wants to know is the OTC omega 3 fish oil is  going to work the same and if so how much should he take a day and if not he wants to know if we can send in a rx for [facility]ibrate [facility] since [facility] will cover this. He just wants to make sure he has what he needs to keep his triglycerides down. Please advise

## 2015-05-28 MED ORDER — FENOFIBRATE 48 MG PO TABS: 48.0000 mg | ORAL_TABLET | Freq: Every day | ORAL | Status: DC

## 2015-05-28 NOTE — Telephone Encounter (Signed)
Detailed message left for patient.

## 2015-05-28 NOTE — Telephone Encounter (Signed)
[facility] correct amount [facility]

## 2015-09-08 ENCOUNTER — Ambulatory Visit (INDEPENDENT_AMBULATORY_CARE_PROVIDER_SITE_OTHER): Payer: BLUE CROSS/BLUE SHIELD | Admitting: Family Medicine

## 2015-09-08 ENCOUNTER — Encounter: Payer: Self-pay | Admitting: Family Medicine

## 2015-09-08 ENCOUNTER — Ambulatory Visit (INDEPENDENT_AMBULATORY_CARE_PROVIDER_SITE_OTHER): Payer: BLUE CROSS/BLUE SHIELD

## 2015-09-08 VITALS — BP 131/84 | HR 84 | Temp 97.0°F | Ht 72.0 in | Wt 237.8 lb

## 2015-09-08 DIAGNOSIS — R0781 Pleurodynia: Secondary | ICD-10-CM

## 2015-09-08 DIAGNOSIS — R569 Unspecified convulsions: Secondary | ICD-10-CM

## 2015-09-08 MED ORDER — HYDROCODONE-ACETAMINOPHEN 5-325 MG PO TABS: 1.0000 | ORAL_TABLET | Freq: Four times a day (QID) | ORAL | Status: DC | PRN

## 2015-09-08 MED ORDER — PHENOBARBITAL 16.2 MG PO TABS: 16.2000 mg | ORAL_TABLET | Freq: Every day | ORAL | Status: DC

## 2015-09-08 MED ORDER — PHENOBARBITAL 16.2 MG PO TABS: 16.2000 mg | ORAL_TABLET | Freq: Two times a day (BID) | ORAL | Status: DC

## 2015-09-08 MED ORDER — PHENOBARBITAL 100 MG PO TABS: 100.0000 mg | ORAL_TABLET | Freq: Every day | ORAL | Status: DC

## 2015-09-08 MED ORDER — MELOXICAM 15 MG PO TABS: 15.0000 mg | ORAL_TABLET | Freq: Every day | ORAL | Status: DC

## 2015-09-08 MED ORDER — PHENOBARBITAL 16.2 MG PO TABS: 32.4000 mg | ORAL_TABLET | Freq: Every day | ORAL | Status: DC

## 2015-09-08 NOTE — Patient Instructions (Addendum)
Great to meet you!   Take meloxicam every day for the next 10 days Take hydrocodone for severe pain, do not drive or operate machinery after taking it

## 2015-09-08 NOTE — Progress Notes (Signed)
   HPI  Patient presents today here with rib pain after fall.  Explains that 4 days ago he was up on a ladder when it began to sink into the ground and he fell landing on some rocks on his left side. He states that he did not hit his head but he did not breath out of himself.  He's developed bruises on the left chest where he has point tenderness over the bony rib. He denies any shortness of breath. He has chest pain that hurts with movement in particular directions. It is reproduced with palpation of the chest wall.  He has some abrasions on his left leg, right leg, and left arm which are improving and healing as expected.  PMH: Smoking status noted ROS: Per HPI  Objective: BP 131/84 mmHg  Pulse 84  Temp(Src) 97 F (36.1 C) (Oral)  Ht 6' (1.829 m)  Wt 237 lb 12.8 oz (107.865 kg)  BMI 32.24 kg/m2 Gen: NAD, alert, cooperative with exam HEENT: NCAT CV: RRR, good S1/S2, no murmur Resp: CTABL, no wheezes, non-labored Ext: No edema, warm Neuro: Alert and oriented, No gross deficits  Skin Left-sided chest bruising, tenderness to palpation over the bony rib in that area Healing abrasion on the left lower right lower and left upper extremity  X-ray shows likely fracture over the ninth rib visible near the gastric bubble  Assessment and plan:  # Rib fracture, rib pain Discussed usual course of illness Scheduled NSAIDs 10 days Given a small amount of hydrocodone to help with sleep and pain related to laying down at night Return to clinic with any concerns, worsening symptoms, or failure to improve as expected.  Refill phenobarbital for seizure disorder, patient states it's very well controlled and has no complaints.    radiology read pending   Orders Placed This Encounter  Procedures  . DG Ribs Unilateral W/Chest Left    Standing Status: Future     Number of Occurrences: 1     Standing Expiration Date: 11/07/2016    Order Specific Question:  Reason for Exam (SYMPTOM  OR  DIAGNOSIS REQUIRED)    Answer:  rib pain after a fall    Order Specific Question:  Preferred imaging location?    Answer:  Internal     Laroy Apple, MD [facility] 09/08/2015, 5:01 PM

## 2015-09-13 ENCOUNTER — Other Ambulatory Visit: Payer: Self-pay | Admitting: Family Medicine

## 2015-09-14 NOTE — Telephone Encounter (Signed)
Can he have a months supply

## 2015-10-08 ENCOUNTER — Other Ambulatory Visit: Payer: Self-pay | Admitting: Family Medicine

## 2016-01-06 ENCOUNTER — Other Ambulatory Visit: Payer: Self-pay | Admitting: Family Medicine

## 2016-01-10 ENCOUNTER — Encounter: Payer: Self-pay | Admitting: Family Medicine

## 2016-01-10 ENCOUNTER — Ambulatory Visit (INDEPENDENT_AMBULATORY_CARE_PROVIDER_SITE_OTHER): Payer: BLUE CROSS/BLUE SHIELD | Admitting: Family Medicine

## 2016-01-10 VITALS — BP 108/73 | HR 74 | Temp 97.4°F | Ht 72.0 in | Wt 226.1 lb

## 2016-01-10 DIAGNOSIS — R569 Unspecified convulsions: Secondary | ICD-10-CM | POA: Diagnosis not present

## 2016-01-10 DIAGNOSIS — E785 Hyperlipidemia, unspecified: Secondary | ICD-10-CM

## 2016-01-10 DIAGNOSIS — I10 Essential (primary) hypertension: Secondary | ICD-10-CM

## 2016-01-10 MED ORDER — NIACIN ER (ANTIHYPERLIPIDEMIC) 500 MG PO TBCR: 500.0000 mg | EXTENDED_RELEASE_TABLET | Freq: Every day | ORAL | 3 refills | Status: DC

## 2016-01-10 MED ORDER — ROSUVASTATIN CALCIUM 10 MG PO TABS: 10.0000 mg | ORAL_TABLET | Freq: Every day | ORAL | 3 refills | Status: DC

## 2016-01-10 MED ORDER — FENOFIBRATE 48 MG PO TABS: 48.0000 mg | ORAL_TABLET | Freq: Every day | ORAL | 3 refills | Status: DC

## 2016-01-10 MED ORDER — PHENOBARBITAL 100 MG PO TABS: 100.0000 mg | ORAL_TABLET | Freq: Every day | ORAL | 3 refills | Status: DC

## 2016-01-10 MED ORDER — VALSARTAN 160 MG PO TABS: 160.0000 mg | ORAL_TABLET | Freq: Every day | ORAL | 3 refills | Status: DC

## 2016-01-10 MED ORDER — PHENOBARBITAL 16.2 MG PO TABS: 32.4000 mg | ORAL_TABLET | Freq: Every day | ORAL | 3 refills | Status: DC

## 2016-01-10 NOTE — Progress Notes (Signed)
BP 108/73   Pulse 74   Temp 97.4 F (36.3 C) (Oral)   Ht 6' (1.829 m)   Wt 226 lb 2 oz (102.6 kg)   BMI 30.67 kg/m    Subjective:    Patient ID: Christopher Salas, male    DOB: 02-Apr-1965, 50 y.o.   MRN: [id number]  HPI: Christopher Salas is a 50 y.o. male presenting on 01/10/2016 for Hyperlipidemia (followup; patient is fasting) and Hypertension   HPI Hyperlipidemia recheck Patient is coming today for a cholesterol recheck. His last cholesterol had a slightly elevated triglycerides but was otherwise normal. He is currently on TriCor and niacin and Crestor. He denies any issues or side effects from his medication. He denies any myalgias. He is fasting today so we can recheck his labs.  Hypertension recheck Patient denies headaches, blurred vision, chest pains, shortness of breath, or weakness. Denies any side effects from medication and is content with current medication. Patient's blood pressure today is 108/73. He has not been having any lightheadedness or dizziness either. He is currently on Diovan for his blood pressure.  Seizure recheck Patient is coming in for a refill on his seizure medications. He has continued to be on medications and has not had any seizures. He denies any side effects from medications. He just needs a refill for today. He is currently on phenobarbital and has been for quite some time.  Relevant past medical, surgical, family and social history reviewed and updated as indicated. Interim medical history since our last visit reviewed. Allergies and medications reviewed and updated.  Review of Systems  Constitutional: Negative for chills and fever.  Respiratory: Negative for cough, shortness of breath and wheezing.   Cardiovascular: Negative for chest pain and leg swelling.  Musculoskeletal: Negative for back pain and gait problem.  Skin: Negative for rash.  Neurological: Negative for dizziness, seizures, speech difficulty, weakness, light-headedness and  numbness.  All other systems reviewed and are negative.   Per HPI unless specifically indicated above     Medication List       Accurate as of 01/10/16  9:13 AM. Always use your most recent med list.          fenofibrate 48 MG tablet Commonly known as:  TRICOR Take 1 tablet (48 mg total) by mouth daily.   HYDROcodone-acetaminophen 5-325 MG tablet Commonly known as:  NORCO Take 1 tablet by mouth every 6 (six) hours as needed for moderate pain.   Icosapent Ethyl 1 g Caps Take 2 tablets by mouth 2 (two) times daily.   meloxicam 15 MG tablet Commonly known as:  MOBIC Take 1 tablet (15 mg total) by mouth daily.   meloxicam 15 MG tablet Commonly known as:  MOBIC TAKE 1 TABLET DAILY   niacin 500 MG CR tablet Commonly known as:  NIASPAN Take 1 tablet (500 mg total) by mouth daily with breakfast.   omega-3 acid ethyl esters 1 g capsule Commonly known as:  LOVAZA Take 2 capsules (2 g total) by mouth 2 (two) times daily.   PHENObarbital 100 MG tablet Commonly known as:  LUMINAL Take 1 tablet (100 mg total) by mouth at bedtime.   phenobarbital 16.2 MG tablet Commonly known as:  LUMINAL Take 2 tablets (32.4 mg total) by mouth at bedtime. Take 2 tablets (32.4 mg) at bedtime   rosuvastatin 10 MG tablet Commonly known as:  CRESTOR Take 1 tablet (10 mg total) by mouth daily.   valsartan 160 MG tablet Commonly known  as:  DIOVAN Take 1 tablet (160 mg total) by mouth daily.          Objective:    BP 108/73   Pulse 74   Temp 97.4 F (36.3 C) (Oral)   Ht 6' (1.829 m)   Wt 226 lb 2 oz (102.6 kg)   BMI 30.67 kg/m   Wt Readings from Last 3 Encounters:  01/10/16 226 lb 2 oz (102.6 kg)  09/08/15 237 lb 12.8 oz (107.9 kg)  03/26/15 224 lb 12.8 oz (102 kg)    Physical Exam  Constitutional: He is oriented to person, place, and time. He appears well-developed and well-nourished. No distress.  Eyes: Conjunctivae are normal. Right eye exhibits no discharge. No scleral  icterus.  Neck: Neck supple. No thyromegaly present.  Cardiovascular: Normal rate, regular rhythm, normal heart sounds and intact distal pulses.   No murmur heard. Pulmonary/Chest: Effort normal and breath sounds normal. No respiratory distress. He has no wheezes. He has no rales.  Musculoskeletal: Normal range of motion. He exhibits no edema.  Lymphadenopathy:    He has no cervical adenopathy.  Neurological: He is alert and oriented to person, place, and time. He displays normal reflexes. Coordination normal.  Skin: Skin is warm and dry. No rash noted. He is not diaphoretic.  Psychiatric: He has a normal mood and affect. His behavior is normal.  Nursing note and vitals reviewed.     Assessment & Plan:   Problem List Items Addressed This Visit      Cardiovascular and Mediastinum   Hypertension - Primary   Relevant Medications   fenofibrate (TRICOR) 48 MG tablet   niacin (NIASPAN) 500 MG CR tablet   rosuvastatin (CRESTOR) 10 MG tablet   valsartan (DIOVAN) 160 MG tablet   Other Relevant Orders   CMP14+EGFR     Other   Hyperlipidemia   Relevant Medications   fenofibrate (TRICOR) 48 MG tablet   niacin (NIASPAN) 500 MG CR tablet   rosuvastatin (CRESTOR) 10 MG tablet   valsartan (DIOVAN) 160 MG tablet   Other Relevant Orders   Lipid panel   Seizures (HCC)   Relevant Medications   PHENObarbital (LUMINAL) 100 MG tablet   phenobarbital (LUMINAL) 16.2 MG tablet    Other Visit Diagnoses   None.      Follow up plan: Return in about 6 months (around 07/10/2016), or if symptoms worsen or fail to improve, for Recheck hypertension and cholesterol.  Counseling provided for all of the vaccine components Orders Placed This Encounter  Procedures  . CMP14+EGFR  . Lipid panel    Christopher Pina, MD [facility] 01/10/2016, 9:13 AM

## 2016-01-11 LAB — CMP14+EGFR
ALT: 15 IU/L (ref 0–44)
AST: 25 IU/L (ref 0–40)
Albumin/Globulin Ratio: 1.7 (ref 1.2–2.2)
Albumin: 4.3 g/dL (ref 3.5–5.5)
Alkaline Phosphatase: 97 IU/L (ref 39–117)
BUN/Creatinine Ratio: 14 (ref 9–20)
BUN: 17 mg/dL (ref 6–24)
Bilirubin Total: <0.2 mg/dL (ref 0.0–1.2)
CO2: 21 mmol/L (ref 18–29)
Calcium: 9.1 mg/dL (ref 8.7–10.2)
Chloride: 102 mmol/L (ref 96–106)
Creatinine, Ser: 1.22 mg/dL (ref 0.76–1.27)
GFR calc Af Amer: 78 mL/min/{1.73_m2} (ref >59)
GFR calc non Af Amer: 68 mL/min/{1.73_m2} (ref >59)
Globulin, Total: 2.6 g/dL (ref 1.5–4.5)
Glucose: 99 mg/dL (ref 65–99)
Potassium: 3.6 mmol/L (ref 3.5–5.2)
Sodium: 140 mmol/L (ref 134–144)
Total Protein: 6.9 g/dL (ref 6.0–8.5)

## 2016-01-11 LAB — LIPID PANEL
Chol/HDL Ratio: 5.6 ratio units — ABNORMAL HIGH (ref 0.0–5.0)
Cholesterol, Total: 189 mg/dL (ref 100–199)
HDL: 34 mg/dL — ABNORMAL LOW (ref >39)
Triglycerides: 523 mg/dL — ABNORMAL HIGH (ref 0–149)

## 2016-03-15 ENCOUNTER — Ambulatory Visit (INDEPENDENT_AMBULATORY_CARE_PROVIDER_SITE_OTHER): Payer: BLUE CROSS/BLUE SHIELD | Admitting: Family

## 2016-03-15 ENCOUNTER — Encounter: Payer: Self-pay | Admitting: Family

## 2016-03-15 VITALS — BP 141/93 | HR 93 | Temp 97.9°F | Ht 72.0 in | Wt 228.2 lb

## 2016-03-15 DIAGNOSIS — Z0000 Encounter for general adult medical examination without abnormal findings: Secondary | ICD-10-CM | POA: Diagnosis not present

## 2016-03-15 DIAGNOSIS — G8929 Other chronic pain: Secondary | ICD-10-CM | POA: Insufficient documentation

## 2016-03-15 DIAGNOSIS — Z111 Encounter for screening for respiratory tuberculosis: Secondary | ICD-10-CM

## 2016-03-15 DIAGNOSIS — R569 Unspecified convulsions: Secondary | ICD-10-CM

## 2016-03-15 DIAGNOSIS — Z114 Encounter for screening for human immunodeficiency virus [HIV]: Secondary | ICD-10-CM

## 2016-03-15 DIAGNOSIS — Z125 Encounter for screening for malignant neoplasm of prostate: Secondary | ICD-10-CM

## 2016-03-15 DIAGNOSIS — Z1159 Encounter for screening for other viral diseases: Secondary | ICD-10-CM

## 2016-03-15 DIAGNOSIS — I10 Essential (primary) hypertension: Secondary | ICD-10-CM | POA: Diagnosis not present

## 2016-03-15 DIAGNOSIS — M79641 Pain in right hand: Secondary | ICD-10-CM

## 2016-03-15 DIAGNOSIS — Z23 Encounter for immunization: Secondary | ICD-10-CM | POA: Diagnosis not present

## 2016-03-15 DIAGNOSIS — E785 Hyperlipidemia, unspecified: Secondary | ICD-10-CM

## 2016-03-15 NOTE — Progress Notes (Signed)
Subjective:    Patient ID: Christopher Salas, male    DOB: [birth year], 50 y.o.   MRN: [id number]  PT presents to the office today for CPE.  Hyperlipidemia  This is a chronic problem. The current episode started more than 1 year ago. The problem is uncontrolled. Recent lipid tests were reviewed and are high. Exacerbating diseases include obesity. Pertinent negatives include no shortness of breath. Current antihyperlipidemic treatment includes exercise and statins. The current treatment provides moderate improvement of lipids. Risk factors for coronary artery disease include dyslipidemia, family history, male sex, obesity and a sedentary lifestyle.  Seizures   This is a chronic problem. The problem has been resolved (PT states his last seziure was 30 years ago). Pertinent negatives include no headaches.  Hypertension  This is a chronic problem. The current episode started more than 1 year ago. The problem has been waxing and waning since onset. The problem is uncontrolled. Pertinent negatives include no anxiety, headaches, palpitations, peripheral edema or shortness of breath. Past treatments include nothing. There is no history of kidney disease, CAD/MI, CVA, heart failure or a thyroid problem. There is no history of sleep apnea.  Hand Pain   The incident occurred more than 1 week ago. The injury mechanism was a direct blow. The pain radiates to the right hand. The pain is mild. The pain has been intermittent since the incident. Associated symptoms include numbness and tingling. The symptoms are aggravated by movement. He has tried NSAIDs and rest for the symptoms. The treatment provided mild relief.      Review of Systems  Respiratory: Negative for shortness of breath.   Cardiovascular: Negative for palpitations.  Neurological: Positive for tingling, seizures and numbness. Negative for headaches.  All other systems reviewed and are negative.      Objective:   Physical Exam  Constitutional:  He is oriented to person, place, and time. He appears well-developed and well-nourished. No distress.  HENT:  Head: Normocephalic.  Right Ear: External ear normal.  Left Ear: External ear normal.  Mouth/Throat: Oropharynx is clear and moist.  Eyes: Pupils are equal, round, and reactive to light. Right eye exhibits no discharge. Left eye exhibits no discharge.  Neck: Normal range of motion. Neck supple. No thyromegaly present.  Cardiovascular: Normal rate, regular rhythm, normal heart sounds and intact distal pulses.   No murmur heard. Pulmonary/Chest: Effort normal and breath sounds normal. No respiratory distress. He has no wheezes.  Abdominal: Soft. Bowel sounds are normal. He exhibits no distension. There is no tenderness.  Musculoskeletal: Normal range of motion. He exhibits no edema or tenderness.  Neurological: He is alert and oriented to person, place, and time.  Skin: Skin is warm and dry. No rash noted. No erythema.  Psychiatric: He has a normal mood and affect. His behavior is normal. Judgment and thought content normal.  Vitals reviewed.     BP (!) 148/99   Pulse 92   Temp 97.9 F (36.6 C) (Oral)   Ht 6' (1.829 m)   Wt 228 lb 3.2 oz (103.5 kg)   BMI 30.95 kg/m      Assessment & Plan:  1. Essential hypertension -Pt has been taking Decongestant. Told pt to stop today!! - CMP14+EGFR  2. Hyperlipidemia, unspecified hyperlipidemia type - CMP14+EGFR - Lipid panel  3. Seizures ([place]) - CMP14+EGFR  4. Chronic hand pain, right - CMP14+EGFR  5. Annual physical exam - CMP14+EGFR - CBC with Differential/Platelet - Lipid panel - HIV antibody - Hepatitis C  antibody - VITAMIN D 25 Hydroxy (Vit-D Deficiency, Fractures) - Thyroid Panel With TSH - Fecal occult blood, imunochemical; Future  6. Encounter for hepatitis C screening test for low risk patient - CMP14+EGFR - Hepatitis C antibody  7. Encounter for screening for HIV - CMP14+EGFR - HIV antibody  8.  Prostate cancer screening - CMP14+EGFR - PSA, total and free   Continue all meds Labs pending Health Maintenance reviewed Diet and exercise encouraged RTO 6 months   Evelina Dun, FNP

## 2016-03-15 NOTE — Patient Instructions (Signed)

## 2016-03-15 NOTE — Addendum Note (Signed)
Addended by: Shelbie Ammons on: 03/15/2016 04:38 PM   Modules accepted: Orders

## 2016-03-16 ENCOUNTER — Other Ambulatory Visit: Payer: BLUE CROSS/BLUE SHIELD

## 2016-03-16 ENCOUNTER — Telehealth: Payer: Self-pay | Admitting: Family Medicine

## 2016-03-16 ENCOUNTER — Other Ambulatory Visit: Payer: Self-pay | Admitting: Family

## 2016-03-16 DIAGNOSIS — Z0000 Encounter for general adult medical examination without abnormal findings: Secondary | ICD-10-CM

## 2016-03-16 LAB — CMP14+EGFR
ALT: 14 IU/L (ref 0–44)
AST: 19 IU/L (ref 0–40)
Albumin/Globulin Ratio: 1.5 (ref 1.2–2.2)
Albumin: 4.8 g/dL (ref 3.5–5.5)
Alkaline Phosphatase: 84 IU/L (ref 39–117)
BUN/Creatinine Ratio: 19 (ref 9–20)
BUN: 21 mg/dL (ref 6–24)
Bilirubin Total: <0.2 mg/dL (ref 0.0–1.2)
CO2: 25 mmol/L (ref 18–29)
Calcium: 10.1 mg/dL (ref 8.7–10.2)
Chloride: 102 mmol/L (ref 96–106)
Creatinine, Ser: 1.09 mg/dL (ref 0.76–1.27)
GFR calc Af Amer: 90 mL/min/{1.73_m2} (ref >59)
GFR calc non Af Amer: 78 mL/min/{1.73_m2} (ref >59)
Globulin, Total: 3.1 g/dL (ref 1.5–4.5)
Glucose: 95 mg/dL (ref 65–99)
Potassium: 4.4 mmol/L (ref 3.5–5.2)
Sodium: 141 mmol/L (ref 134–144)
Total Protein: 7.9 g/dL (ref 6.0–8.5)

## 2016-03-16 LAB — CBC WITH DIFFERENTIAL/PLATELET
Basophils Absolute: 0 10*3/uL (ref 0.0–0.2)
Basos: 0 %
EOS (ABSOLUTE): 0.1 10*3/uL (ref 0.0–0.4)
Eos: 2 %
Hematocrit: 37.9 % (ref 37.5–51.0)
Hemoglobin: 13.4 g/dL (ref 13.0–17.7)
Immature Grans (Abs): 0 10*3/uL (ref 0.0–0.1)
Immature Granulocytes: 0 %
Lymphocytes Absolute: 1.2 10*3/uL (ref 0.7–3.1)
Lymphs: 28 %
MCH: 32.2 pg (ref 26.6–33.0)
MCHC: 35.4 g/dL (ref 31.5–35.7)
MCV: 91 fL (ref 79–97)
Monocytes Absolute: 0.2 10*3/uL (ref 0.1–0.9)
Monocytes: 6 %
Neutrophils Absolute: 2.8 10*3/uL (ref 1.4–7.0)
Neutrophils: 64 %
Platelets: 244 10*3/uL (ref 150–379)
RBC: 4.16 x10E6/uL (ref 4.14–5.80)
RDW: 13.2 % (ref 12.3–15.4)
WBC: 4.3 10*3/uL (ref 3.4–10.8)

## 2016-03-16 LAB — THYROID PANEL WITH TSH
Free Thyroxine Index: 1.6 (ref 1.2–4.9)
T3 Uptake Ratio: 27 % (ref 24–39)
T4, Total: 6.1 ug/dL (ref 4.5–12.0)
TSH: 3.29 u[IU]/mL (ref 0.450–4.500)

## 2016-03-16 LAB — LIPID PANEL
Chol/HDL Ratio: 5.6 ratio units — ABNORMAL HIGH (ref 0.0–5.0)
Cholesterol, Total: 203 mg/dL — ABNORMAL HIGH (ref 100–199)
HDL: 36 mg/dL — ABNORMAL LOW (ref >39)
Triglycerides: 468 mg/dL — ABNORMAL HIGH (ref 0–149)

## 2016-03-16 LAB — PSA, TOTAL AND FREE
PSA, Free Pct: 14.8 %
PSA, Free: 0.34 ng/mL
Prostate Specific Ag, Serum: 2.3 ng/mL (ref 0.0–4.0)

## 2016-03-16 LAB — HEPATITIS C ANTIBODY: Hep C Virus Ab: <0.1 s/co ratio (ref 0.0–0.9)

## 2016-03-16 LAB — HIV ANTIBODY (ROUTINE TESTING W REFLEX): HIV Screen 4th Generation wRfx: NONREACTIVE

## 2016-03-16 LAB — VITAMIN D 25 HYDROXY (VIT D DEFICIENCY, FRACTURES): Vit D, 25-Hydroxy: 32.2 ng/mL (ref 30.0–100.0)

## 2016-03-16 MED ORDER — ATORVASTATIN CALCIUM 20 MG PO TABS: 20.0000 mg | ORAL_TABLET | Freq: Every day | ORAL | 1 refills | Status: DC

## 2016-03-16 NOTE — Telephone Encounter (Signed)
Patient aware of lab results.

## 2016-03-17 ENCOUNTER — Other Ambulatory Visit: Payer: Self-pay | Admitting: Family

## 2016-03-17 DIAGNOSIS — Z1211 Encounter for screening for malignant neoplasm of colon: Secondary | ICD-10-CM

## 2016-03-17 LAB — FECAL OCCULT BLOOD, IMMUNOCHEMICAL: Fecal Occult Bld: POSITIVE — AB

## 2016-03-21 ENCOUNTER — Telehealth: Payer: Self-pay | Admitting: Family Medicine

## 2016-03-21 NOTE — Telephone Encounter (Signed)
Patient aware of lab results.

## 2016-03-22 ENCOUNTER — Other Ambulatory Visit: Payer: BLUE CROSS/BLUE SHIELD

## 2016-03-22 DIAGNOSIS — Z1211 Encounter for screening for malignant neoplasm of colon: Secondary | ICD-10-CM | POA: Diagnosis not present

## 2016-03-24 LAB — FECAL OCCULT BLOOD, IMMUNOCHEMICAL: Fecal Occult Bld: NEGATIVE

## 2016-04-20 ENCOUNTER — Other Ambulatory Visit: Payer: Self-pay | Admitting: *Deleted

## 2016-04-20 DIAGNOSIS — I10 Essential (primary) hypertension: Secondary | ICD-10-CM

## 2016-04-20 DIAGNOSIS — E785 Hyperlipidemia, unspecified: Secondary | ICD-10-CM

## 2016-04-20 DIAGNOSIS — R569 Unspecified convulsions: Secondary | ICD-10-CM

## 2016-04-20 MED ORDER — ROSUVASTATIN CALCIUM 10 MG PO TABS: 10.0000 mg | ORAL_TABLET | Freq: Every day | ORAL | 3 refills | Status: DC

## 2016-04-20 MED ORDER — PHENOBARBITAL 100 MG PO TABS: 100.0000 mg | ORAL_TABLET | Freq: Every day | ORAL | 3 refills | Status: DC

## 2016-04-20 MED ORDER — VALSARTAN 160 MG PO TABS: 160.0000 mg | ORAL_TABLET | Freq: Every day | ORAL | 3 refills | Status: DC

## 2016-04-20 MED ORDER — PHENOBARBITAL 16.2 MG PO TABS: 32.4000 mg | ORAL_TABLET | Freq: Every day | ORAL | 3 refills | Status: DC

## 2016-04-20 MED ORDER — FENOFIBRATE 48 MG PO TABS: 48.0000 mg | ORAL_TABLET | Freq: Every day | ORAL | 3 refills | Status: DC

## 2016-06-13 ENCOUNTER — Other Ambulatory Visit: Payer: Self-pay | Admitting: *Deleted

## 2016-06-13 ENCOUNTER — Encounter: Payer: Self-pay | Admitting: *Deleted

## 2016-06-13 DIAGNOSIS — E785 Hyperlipidemia, unspecified: Secondary | ICD-10-CM

## 2016-06-13 MED ORDER — OMEGA-3-ACID ETHYL ESTERS 1 G PO CAPS: 2.0000 | ORAL_CAPSULE | Freq: Two times a day (BID) | ORAL | 1 refills | Status: DC

## 2016-06-20 ENCOUNTER — Other Ambulatory Visit: Payer: Self-pay | Admitting: Family Medicine

## 2016-06-20 NOTE — Telephone Encounter (Signed)
What is the name of the medication? OMEGA 3 RX shows it was 06-03-16  Have you contacted your pharmacy to request a refill? YES   Which pharmacy would you like this sent to? [facility]   Patient notified that their request is being sent to the clinical staff for review and that they should receive a call once it is complete. If they do not receive a call within 24 hours they can check with their pharmacy or our office.

## 2016-06-21 NOTE — Telephone Encounter (Signed)
done03/27/18

## 2016-07-12 ENCOUNTER — Encounter: Payer: Self-pay | Admitting: Family Medicine

## 2016-07-12 ENCOUNTER — Ambulatory Visit (INDEPENDENT_AMBULATORY_CARE_PROVIDER_SITE_OTHER): Payer: BC Managed Care – PPO | Admitting: Family Medicine

## 2016-07-12 VITALS — BP 120/80 | HR 74 | Temp 98.1°F | Ht 72.0 in | Wt 234.0 lb

## 2016-07-12 DIAGNOSIS — R569 Unspecified convulsions: Secondary | ICD-10-CM

## 2016-07-12 DIAGNOSIS — E785 Hyperlipidemia, unspecified: Secondary | ICD-10-CM | POA: Diagnosis not present

## 2016-07-12 DIAGNOSIS — I10 Essential (primary) hypertension: Secondary | ICD-10-CM | POA: Diagnosis not present

## 2016-07-12 MED ORDER — PHENOBARBITAL 16.2 MG PO TABS: 32.4000 mg | ORAL_TABLET | Freq: Every day | ORAL | 3 refills | Status: DC

## 2016-07-12 MED ORDER — PHENOBARBITAL 100 MG PO TABS: 100.0000 mg | ORAL_TABLET | Freq: Every day | ORAL | 3 refills | Status: DC

## 2016-07-12 NOTE — Progress Notes (Signed)
BP 120/80   Pulse 74   Temp 98.1 F (36.7 C) (Oral)   Ht 6' (1.829 m)   Wt 234 lb (106.1 kg)   BMI 31.74 kg/m    Subjective:    Patient ID: Christopher Salas, male    DOB: Jan 30, 1966, 51 y.o.   MRN: [id number]  HPI: Christopher Salas is a 51 y.o. male presenting on 07/12/2016 for Hyperlipidemia (6 month followup; patient is fasting) and Hypertension   HPI Hypertension Patient is coming in for a hypertension recheck. His blood pressure is 120/80. Patient is currently on Diovan and denies any issues with it. Patient denies headaches, blurred vision, chest pains, shortness of breath, or weakness. Denies any side effects from medication and is content with current medication.   Hyperlipidemia Patient is coming in for recheck of his hyperlipidemia. He is currently taking Crestor and Lipitor and fenofibrate, will stop the Lipitor and increase the Crestor.Marland Kitchen He denies any issues with myalgias or history of liver damage from it. He denies any focal numbness or weakness or chest pain.   Seizures recheck Patient is coming in for a seizure recheck. He has not had any seizures in at least a couple years. He says the medication is doing very well for him and he denies any side effects with that.  Relevant past medical, surgical, family and social history reviewed and updated as indicated. Interim medical history since our last visit reviewed. Allergies and medications reviewed and updated.  Review of Systems  Constitutional: Negative for chills and fever.  Eyes: Negative for discharge.  Respiratory: Negative for shortness of breath and wheezing.   Cardiovascular: Negative for chest pain and leg swelling.  Gastrointestinal: Negative for abdominal pain.  Musculoskeletal: Negative for back pain and gait problem.  Skin: Negative for rash.  Neurological: Negative for dizziness, weakness, light-headedness, numbness and headaches.  All other systems reviewed and are negative.   Per HPI unless  specifically indicated above      Objective:    BP 120/80   Pulse 74   Temp 98.1 F (36.7 C) (Oral)   Ht 6' (1.829 m)   Wt 234 lb (106.1 kg)   BMI 31.74 kg/m   Wt Readings from Last 3 Encounters:  07/12/16 234 lb (106.1 kg)  03/15/16 228 lb 3.2 oz (103.5 kg)  01/10/16 226 lb 2 oz (102.6 kg)    Physical Exam  Constitutional: He is oriented to person, place, and time. He appears well-developed and well-nourished. No distress.  Eyes: Conjunctivae are normal. No scleral icterus.  Neck: Neck supple. No thyromegaly present.  Cardiovascular: Normal rate, regular rhythm, normal heart sounds and intact distal pulses.   No murmur heard. Pulmonary/Chest: Effort normal and breath sounds normal. No respiratory distress. He has no wheezes. He has no rales.  Musculoskeletal: Normal range of motion. He exhibits no edema or tenderness.  Lymphadenopathy:    He has no cervical adenopathy.  Neurological: He is alert and oriented to person, place, and time. No cranial nerve deficit. He exhibits normal muscle tone. Coordination normal.  Skin: Skin is warm and dry. No rash noted. He is not diaphoretic.  Psychiatric: He has a normal mood and affect. His behavior is normal.  Nursing note and vitals reviewed.     Assessment & Plan:   Problem List Items Addressed This Visit      Cardiovascular and Mediastinum   Hypertension - Primary   Relevant Medications   rosuvastatin (CRESTOR) 20 MG tablet   Other  Relevant Orders   CMP14+EGFR (Completed)     Other   Hyperlipidemia   Relevant Medications   rosuvastatin (CRESTOR) 20 MG tablet   Other Relevant Orders   Lipid panel (Completed)   Seizures ([name])   Relevant Medications   phenobarbital (LUMINAL) 16.2 MG tablet   PHENObarbital (LUMINAL) 100 MG tablet   Other Relevant Orders   CBC with Differential/Platelet (Completed)     Doing well on current medications, continue current medications and see back in 6 months   Follow up plan: Return  in about 6 months (around 01/11/2017), or if symptoms worsen or fail to improve, for Hypertension and cholesterol seizures.  Counseling provided for all of the vaccine components Orders Placed This Encounter  Procedures  . CMP14+EGFR  . CBC with Differential/Platelet  . Lipid panel    Caryl Pina, MD [facility] 07/12/2016, 4:37 PM

## 2016-07-13 LAB — LIPID PANEL
Chol/HDL Ratio: 5.2 ratio — ABNORMAL HIGH (ref 0.0–5.0)
Cholesterol, Total: 191 mg/dL (ref 100–199)
HDL: 37 mg/dL — ABNORMAL LOW (ref >39)
LDL Calculated: 97 mg/dL (ref 0–99)
Triglycerides: 286 mg/dL — ABNORMAL HIGH (ref 0–149)
VLDL Cholesterol Cal: 57 mg/dL — ABNORMAL HIGH (ref 5–40)

## 2016-07-13 LAB — CMP14+EGFR
ALT: 13 IU/L (ref 0–44)
AST: 23 IU/L (ref 0–40)
Albumin/Globulin Ratio: 2.1 (ref 1.2–2.2)
Albumin: 4.9 g/dL (ref 3.5–5.5)
Alkaline Phosphatase: 75 IU/L (ref 39–117)
BUN/Creatinine Ratio: 16 (ref 9–20)
BUN: 19 mg/dL (ref 6–24)
Bilirubin Total: 0.2 mg/dL (ref 0.0–1.2)
CO2: 23 mmol/L (ref 18–29)
Calcium: 9.9 mg/dL (ref 8.7–10.2)
Chloride: 101 mmol/L (ref 96–106)
Creatinine, Ser: 1.21 mg/dL (ref 0.76–1.27)
GFR calc Af Amer: 79 mL/min/{1.73_m2} (ref >59)
GFR calc non Af Amer: 68 mL/min/{1.73_m2} (ref >59)
Globulin, Total: 2.3 g/dL (ref 1.5–4.5)
Glucose: 95 mg/dL (ref 65–99)
Potassium: 4.6 mmol/L (ref 3.5–5.2)
Sodium: 142 mmol/L (ref 134–144)
Total Protein: 7.2 g/dL (ref 6.0–8.5)

## 2016-07-13 LAB — CBC WITH DIFFERENTIAL/PLATELET
Basophils Absolute: 0 10*3/uL (ref 0.0–0.2)
Basos: 0 %
EOS (ABSOLUTE): 0.1 10*3/uL (ref 0.0–0.4)
Eos: 3 %
Hematocrit: 36.1 % — ABNORMAL LOW (ref 37.5–51.0)
Hemoglobin: 12.6 g/dL — ABNORMAL LOW (ref 13.0–17.7)
Immature Grans (Abs): 0 10*3/uL (ref 0.0–0.1)
Immature Granulocytes: 0 %
Lymphocytes Absolute: 1.6 10*3/uL (ref 0.7–3.1)
Lymphs: 37 %
MCH: 32.8 pg (ref 26.6–33.0)
MCHC: 34.9 g/dL (ref 31.5–35.7)
MCV: 94 fL (ref 79–97)
Monocytes Absolute: 0.3 10*3/uL (ref 0.1–0.9)
Monocytes: 6 %
Neutrophils Absolute: 2.3 10*3/uL (ref 1.4–7.0)
Neutrophils: 54 %
Platelets: 225 10*3/uL (ref 150–379)
RBC: 3.84 x10E6/uL — ABNORMAL LOW (ref 4.14–5.80)
RDW: 12.6 % (ref 12.3–15.4)
WBC: 4.3 10*3/uL (ref 3.4–10.8)

## 2016-07-13 MED ORDER — ROSUVASTATIN CALCIUM 20 MG PO TABS: 20.0000 mg | ORAL_TABLET | Freq: Every day | ORAL | 3 refills | Status: DC

## 2016-07-14 ENCOUNTER — Telehealth: Payer: Self-pay | Admitting: Family Medicine

## 2016-07-14 NOTE — Telephone Encounter (Signed)
Pt called

## 2016-10-19 ENCOUNTER — Other Ambulatory Visit: Payer: Self-pay | Admitting: Family Medicine

## 2016-10-19 ENCOUNTER — Other Ambulatory Visit: Payer: Self-pay | Admitting: Family

## 2016-10-19 MED ORDER — OLMESARTAN MEDOXOMIL 20 MG PO TABS: 20.0000 mg | ORAL_TABLET | Freq: Every day | ORAL | 3 refills | Status: DC

## 2016-10-19 NOTE — Progress Notes (Signed)
Left detailed message for pt regarding RX 

## 2016-10-19 NOTE — Progress Notes (Signed)
Switched patient from valsartan to olmesartan because of recall.

## 2016-11-27 ENCOUNTER — Telehealth: Payer: Self-pay | Admitting: Family Medicine

## 2016-11-27 DIAGNOSIS — E785 Hyperlipidemia, unspecified: Secondary | ICD-10-CM

## 2016-11-27 DIAGNOSIS — I10 Essential (primary) hypertension: Secondary | ICD-10-CM

## 2016-11-27 MED ORDER — FENOFIBRATE 48 MG PO TABS: 48.0000 mg | ORAL_TABLET | Freq: Every day | ORAL | 0 refills | Status: DC

## 2016-11-27 MED ORDER — NIACIN ER (ANTIHYPERLIPIDEMIC) 500 MG PO TBCR: 500.0000 mg | EXTENDED_RELEASE_TABLET | Freq: Every day | ORAL | 0 refills | Status: DC

## 2016-11-27 MED ORDER — OMEGA-3-ACID ETHYL ESTERS 1 G PO CAPS: 2.0000 | ORAL_CAPSULE | Freq: Two times a day (BID) | ORAL | 0 refills | Status: DC

## 2016-11-27 MED ORDER — OLMESARTAN MEDOXOMIL 20 MG PO TABS: 20.0000 mg | ORAL_TABLET | Freq: Every day | ORAL | 0 refills | Status: DC

## 2016-11-27 MED ORDER — ROSUVASTATIN CALCIUM 20 MG PO TABS: 20.0000 mg | ORAL_TABLET | Freq: Every day | ORAL | 0 refills | Status: DC

## 2016-11-27 MED ORDER — VALSARTAN 160 MG PO TABS: 160.0000 mg | ORAL_TABLET | Freq: Every day | ORAL | 0 refills | Status: DC

## 2016-11-27 NOTE — Telephone Encounter (Signed)
LMOVM that RF on maintenance meds sent to [facility]  Pt has appt on 01/11/17

## 2016-11-28 ENCOUNTER — Telehealth: Payer: Self-pay | Admitting: *Deleted

## 2016-11-28 MED ORDER — VALSARTAN 160 MG PO TABS: 160.0000 mg | ORAL_TABLET | Freq: Every day | ORAL | 0 refills | Status: DC

## 2016-11-28 NOTE — Addendum Note (Signed)
Addended by: Antonietta Barcelona D on: 11/28/2016 03:46 PM   Modules accepted: Orders

## 2016-11-28 NOTE — Telephone Encounter (Signed)
Lovaza - non preferred Preferred is Gemfibrozal Fenofibrate tabs Fenofibrate caps Fenofibrate acid tabs Fenofibrate nano tabs Please Advise and send in new Rx  [facility] ref # [id number]

## 2016-11-29 ENCOUNTER — Ambulatory Visit: Payer: BC Managed Care – PPO | Admitting: Family Medicine

## 2016-11-29 ENCOUNTER — Other Ambulatory Visit: Payer: Self-pay | Admitting: *Deleted

## 2016-11-29 NOTE — Telephone Encounter (Signed)
Try to send Vascepa 1g, 2 pills twice daily, give him enough for 6 months. See if his insurance will cover this

## 2016-11-29 NOTE — Telephone Encounter (Signed)
Generic Vascepa was already sent in February.  List of alternate medications has fenofibrate listed which is already on patient's list.  Will advise patient must purchase fish oil over the counter?

## 2016-12-04 MED ORDER — VALSARTAN 160 MG PO TABS: 160.0000 mg | ORAL_TABLET | Freq: Every day | ORAL | 0 refills | Status: DC

## 2016-12-04 NOTE — Addendum Note (Signed)
Addended by: Antonietta Barcelona D on: 12/04/2016 09:58 AM   Modules accepted: Orders

## 2016-12-04 NOTE — Telephone Encounter (Signed)
Refill on Valsartan called to [facility] since it has failed to go electronically several times

## 2016-12-04 NOTE — Addendum Note (Signed)
Addended by: Antonietta Barcelona D on: 12/04/2016 09:48 AM   Modules accepted: Orders

## 2017-01-11 ENCOUNTER — Ambulatory Visit (INDEPENDENT_AMBULATORY_CARE_PROVIDER_SITE_OTHER): Payer: BLUE CROSS/BLUE SHIELD | Admitting: Family Medicine

## 2017-01-11 ENCOUNTER — Encounter: Payer: Self-pay | Admitting: Family Medicine

## 2017-01-11 VITALS — BP 131/78 | HR 71 | Temp 97.2°F | Ht 72.0 in | Wt 235.0 lb

## 2017-01-11 DIAGNOSIS — E785 Hyperlipidemia, unspecified: Secondary | ICD-10-CM

## 2017-01-11 DIAGNOSIS — L2089 Other atopic dermatitis: Secondary | ICD-10-CM

## 2017-01-11 DIAGNOSIS — I10 Essential (primary) hypertension: Secondary | ICD-10-CM | POA: Diagnosis not present

## 2017-01-11 DIAGNOSIS — R569 Unspecified convulsions: Secondary | ICD-10-CM

## 2017-01-11 MED ORDER — OMEGA-3-ACID ETHYL ESTERS 1 G PO CAPS: 2.0000 | ORAL_CAPSULE | Freq: Two times a day (BID) | ORAL | 0 refills | Status: DC

## 2017-01-11 MED ORDER — FENOFIBRATE 48 MG PO TABS: 48.0000 mg | ORAL_TABLET | Freq: Every day | ORAL | 0 refills | Status: DC

## 2017-01-11 MED ORDER — ROSUVASTATIN CALCIUM 20 MG PO TABS: 20.0000 mg | ORAL_TABLET | Freq: Every day | ORAL | 0 refills | Status: DC

## 2017-01-11 MED ORDER — TRIAMCINOLONE ACETONIDE 0.1 % EX CREA: 1.0000 "application " | TOPICAL_CREAM | Freq: Two times a day (BID) | CUTANEOUS | 0 refills | Status: DC

## 2017-01-11 MED ORDER — OLMESARTAN MEDOXOMIL 20 MG PO TABS: 20.0000 mg | ORAL_TABLET | Freq: Every day | ORAL | 0 refills | Status: DC

## 2017-01-11 MED ORDER — MELOXICAM 15 MG PO TABS: 15.0000 mg | ORAL_TABLET | Freq: Every day | ORAL | 0 refills | Status: DC

## 2017-01-11 MED ORDER — PHENOBARBITAL 100 MG PO TABS: 100.0000 mg | ORAL_TABLET | Freq: Every day | ORAL | 3 refills | Status: DC

## 2017-01-11 MED ORDER — DICLOFENAC SODIUM 1 % TD GEL: 2.0000 g | Freq: Four times a day (QID) | TRANSDERMAL | 5 refills | Status: DC

## 2017-01-11 MED ORDER — NIACIN ER (ANTIHYPERLIPIDEMIC) 500 MG PO TBCR: 500.0000 mg | EXTENDED_RELEASE_TABLET | Freq: Every day | ORAL | 0 refills | Status: DC

## 2017-01-11 MED ORDER — PHENOBARBITAL 16.2 MG PO TABS: 32.4000 mg | ORAL_TABLET | Freq: Every day | ORAL | 3 refills | Status: DC

## 2017-01-11 NOTE — Progress Notes (Signed)
BP 131/78   Pulse 71   Temp (!) 97.2 F (36.2 C) (Oral)   Ht 6' (1.829 m)   Wt 235 lb (106.6 kg)   BMI 31.87 kg/m    Subjective:    Patient ID: Christopher Salas, male    DOB: [birth year], 51 y.o.   MRN: [id number]  HPI: Christopher Salas is a 51 y.o. male presenting on 01/11/2017 for Hypertension (pt here today for routine follow up of his chronic medical conditions and also c/o cough for the past month that OTC medications aren't helping with.)   HPI Hypertension Patient is currently on Benicar, and their blood pressure today is 131/78. Patient denies any lightheadedness or dizziness. Patient denies headaches, blurred vision, chest pains, shortness of breath, or weakness. Denies any side effects from medication and is content with current medication.   Hyperlipidemia Patient is coming in for recheck of his hyperlipidemia. The patient is currently taking fenofibrate and omega-3 and Crestor and niacin.  Patient has been having the most issues with his triglycerides which we had down below 250 but now on his last check 1 week ago it was up over 300 again.  They deny any issues with myalgias or history of liver damage from it. They deny any focal numbness or weakness or chest pain.   Seizure Patient is coming in for recheck of seizures.  He has been on phenobarbital for many years and he has not had any seizures and is been stable on the current dose.  He just needs a refill today.  Rash Patient has a rash on both of his forearms on the posterior aspect that extends up into his arms that is pruritic and when he scratches at it the rash will appear.  He has been fighting this off and on and using cortisone cream for it would like to try something stronger that is prescription if possible.  He has never tried anything besides over-the-counter cortisone before.  He says it will come and go and flareups and it has been really bothering him over the past couple weeks.  He denies any fevers or chills or  redness or warmth or drainage.  Relevant past medical, surgical, family and social history reviewed and updated as indicated. Interim medical history since our last visit reviewed. Allergies and medications reviewed and updated.  Review of Systems  Constitutional: Negative for chills and fever.  Respiratory: Negative for shortness of breath and wheezing.   Cardiovascular: Negative for chest pain and leg swelling.  Musculoskeletal: Negative for back pain and gait problem.  Skin: Positive for rash.  Neurological: Negative for dizziness, weakness, light-headedness, numbness and headaches.  All other systems reviewed and are negative.   Per HPI unless specifically indicated above   Allergies as of 01/11/2017   No Known Allergies     Medication List       Accurate as of 01/11/17  4:48 PM. Always use your most recent med list.          diclofenac sodium 1 % Gel Commonly known as:  VOLTAREN Apply 2 g topically 4 (four) times daily.   fenofibrate 48 MG tablet Commonly known as:  TRICOR Take 1 tablet (48 mg total) by mouth daily.   HYDROcodone-acetaminophen 5-325 MG tablet Commonly known as:  NORCO Take 1 tablet by mouth every 6 (six) hours as needed for moderate pain.   ibuprofen 800 MG tablet Commonly known as:  ADVIL,MOTRIN Take 800 mg by mouth every 8 (eight) hours  as needed.   Icosapent Ethyl 1 g Caps Take 2 tablets by mouth 2 (two) times daily.   meloxicam 15 MG tablet Commonly known as:  MOBIC Take 1 tablet (15 mg total) by mouth daily.   niacin 500 MG CR tablet Commonly known as:  NIASPAN Take 1 tablet (500 mg total) by mouth daily with breakfast.   olmesartan 20 MG tablet Commonly known as:  BENICAR Take 1 tablet (20 mg total) by mouth daily.   omega-3 acid ethyl esters 1 g capsule Commonly known as:  LOVAZA Take 2 capsules (2 g total) by mouth 2 (two) times daily.   PHENObarbital 100 MG tablet Commonly known as:  LUMINAL Take 1 tablet (100 mg total)  by mouth at bedtime.   phenobarbital 16.2 MG tablet Commonly known as:  LUMINAL Take 2 tablets (32.4 mg total) by mouth at bedtime. Take 2 tablets (32.4 mg) at bedtime   rosuvastatin 20 MG tablet Commonly known as:  CRESTOR Take 1 tablet (20 mg total) by mouth daily.   triamcinolone cream 0.1 % Commonly known as:  KENALOG Apply 1 application topically 2 (two) times daily.          Objective:    BP 131/78   Pulse 71   Temp (!) 97.2 F (36.2 C) (Oral)   Ht 6' (1.829 m)   Wt 235 lb (106.6 kg)   BMI 31.87 kg/m   Wt Readings from Last 3 Encounters:  01/11/17 235 lb (106.6 kg)  07/12/16 234 lb (106.1 kg)  03/15/16 228 lb 3.2 oz (103.5 kg)    Physical Exam  Constitutional: He is oriented to person, place, and time. He appears well-developed and well-nourished. No distress.  Eyes: Conjunctivae are normal. No scleral icterus.  Cardiovascular: Normal rate, regular rhythm, normal heart sounds and intact distal pulses.   No murmur heard. Pulmonary/Chest: Effort normal and breath sounds normal. No respiratory distress. He has no wheezes. He has no rales.  Musculoskeletal: Normal range of motion. He exhibits no edema.  Neurological: He is alert and oriented to person, place, and time. Coordination normal.  Skin: Skin is warm and dry. Rash noted. Rash is maculopapular (Maculopapular rash with excoriations on both forearms and upper arms, mostly on forearms.  Follow linear pattern with the scratch marks). He is not diaphoretic.  Psychiatric: He has a normal mood and affect. His behavior is normal.  Nursing note and vitals reviewed.   Results for orders placed or performed in visit on 07/12/16  CMP14+EGFR  Result Value Ref Range   Glucose 95 65 - 99 mg/dL   BUN 19 6 - 24 mg/dL   Creatinine, Ser 1.21 0.76 - 1.27 mg/dL   GFR calc non Af Amer 68 >59 mL/min/1.73   GFR calc Af Amer 79 >59 mL/min/1.73   BUN/Creatinine Ratio 16 9 - 20   Sodium 142 134 - 144 mmol/L   Potassium 4.6 3.5  - 5.2 mmol/L   Chloride 101 96 - 106 mmol/L   CO2 23 18 - 29 mmol/L   Calcium 9.9 8.7 - 10.2 mg/dL   Total Protein 7.2 6.0 - 8.5 g/dL   Albumin 4.9 3.5 - 5.5 g/dL   Globulin, Total 2.3 1.5 - 4.5 g/dL   Albumin/Globulin Ratio 2.1 1.2 - 2.2   Bilirubin Total 0.2 0.0 - 1.2 mg/dL   Alkaline Phosphatase 75 39 - 117 IU/L   AST 23 0 - 40 IU/L   ALT 13 0 - 44 IU/L  CBC with Differential/Platelet  Result Value Ref Range   WBC 4.3 3.4 - 10.8 x10E3/uL   RBC 3.84 (L) 4.14 - 5.80 x10E6/uL   Hemoglobin 12.6 (L) 13.0 - 17.7 g/dL   Hematocrit 36.1 (L) 37.5 - 51.0 %   MCV 94 79 - 97 fL   MCH 32.8 26.6 - 33.0 pg   MCHC 34.9 31.5 - 35.7 g/dL   RDW 12.6 12.3 - 15.4 %   Platelets 225 150 - 379 x10E3/uL   Neutrophils 54 Not Estab. %   Lymphs 37 Not Estab. %   Monocytes 6 Not Estab. %   Eos 3 Not Estab. %   Basos 0 Not Estab. %   Neutrophils Absolute 2.3 1.4 - 7.0 x10E3/uL   Lymphocytes Absolute 1.6 0.7 - 3.1 x10E3/uL   Monocytes Absolute 0.3 0.1 - 0.9 x10E3/uL   EOS (ABSOLUTE) 0.1 0.0 - 0.4 x10E3/uL   Basophils Absolute 0.0 0.0 - 0.2 x10E3/uL   Immature Granulocytes 0 Not Estab. %   Immature Grans (Abs) 0.0 0.0 - 0.1 x10E3/uL  Lipid panel  Result Value Ref Range   Cholesterol, Total 191 100 - 199 mg/dL   Triglycerides 286 (H) 0 - 149 mg/dL   HDL 37 (L) >39 mg/dL   VLDL Cholesterol Cal 57 (H) 5 - 40 mg/dL   LDL Calculated 97 0 - 99 mg/dL   Chol/HDL Ratio 5.2 (H) 0.0 - 5.0 ratio      Assessment & Plan:   Problem List Items Addressed This Visit      Cardiovascular and Mediastinum   Hypertension - Primary   Relevant Medications   rosuvastatin (CRESTOR) 20 MG tablet   omega-3 acid ethyl esters (LOVAZA) 1 g capsule   olmesartan (BENICAR) 20 MG tablet   fenofibrate (TRICOR) 48 MG tablet   niacin (NIASPAN) 500 MG CR tablet   Other Relevant Orders   CMP14+EGFR (Completed)     Other   Hyperlipidemia   Relevant Medications   rosuvastatin (CRESTOR) 20 MG tablet   omega-3 acid ethyl  esters (LOVAZA) 1 g capsule   olmesartan (BENICAR) 20 MG tablet   fenofibrate (TRICOR) 48 MG tablet   niacin (NIASPAN) 500 MG CR tablet   Other Relevant Orders   Lipid panel (Completed)   Seizures ([facility])   Relevant Medications   PHENObarbital (LUMINAL) 100 MG tablet   phenobarbital (LUMINAL) 16.2 MG tablet   Other Relevant Orders   CBC with Differential/Platelet (Completed)    Other Visit Diagnoses    Other atopic dermatitis          No change for cholesterol at this point.  We will keep current medications and recommend diet and exercise.  Gave triamcinolone cream and otherwise maintain current medications  Follow up plan: Return in about 3 months (around 04/13/2017), or if symptoms worsen or fail to improve, for Recheck hypertension and seizures.  Counseling provided for all of the vaccine components Orders Placed This Encounter  Procedures  . CMP14+EGFR  . Lipid panel  . CBC with Differential/Platelet    Caryl Pina, MD [facility] 01/11/2017, 4:48 PM

## 2017-01-12 LAB — CBC WITH DIFFERENTIAL/PLATELET
Basophils Absolute: 0 10*3/uL (ref 0.0–0.2)
Basos: 0 %
EOS (ABSOLUTE): 0.4 10*3/uL (ref 0.0–0.4)
Eos: 7 %
Hematocrit: 34.8 % — ABNORMAL LOW (ref 37.5–51.0)
Hemoglobin: 12.3 g/dL — ABNORMAL LOW (ref 13.0–17.7)
Immature Grans (Abs): 0 10*3/uL (ref 0.0–0.1)
Immature Granulocytes: 0 %
Lymphocytes Absolute: 1.6 10*3/uL (ref 0.7–3.1)
Lymphs: 25 %
MCH: 32 pg (ref 26.6–33.0)
MCHC: 35.3 g/dL (ref 31.5–35.7)
MCV: 91 fL (ref 79–97)
Monocytes Absolute: 0.4 10*3/uL (ref 0.1–0.9)
Monocytes: 7 %
Neutrophils Absolute: 4.1 10*3/uL (ref 1.4–7.0)
Neutrophils: 61 %
Platelets: 254 10*3/uL (ref 150–379)
RBC: 3.84 x10E6/uL — ABNORMAL LOW (ref 4.14–5.80)
RDW: 13 % (ref 12.3–15.4)
WBC: 6.7 10*3/uL (ref 3.4–10.8)

## 2017-01-12 LAB — CMP14+EGFR
ALT: 14 IU/L (ref 0–44)
AST: 20 IU/L (ref 0–40)
Albumin/Globulin Ratio: 1.8 (ref 1.2–2.2)
Albumin: 4.5 g/dL (ref 3.5–5.5)
Alkaline Phosphatase: 78 IU/L (ref 39–117)
BUN/Creatinine Ratio: 14 (ref 9–20)
BUN: 15 mg/dL (ref 6–24)
Bilirubin Total: <0.2 mg/dL (ref 0.0–1.2)
CO2: 22 mmol/L (ref 20–29)
Calcium: 9.4 mg/dL (ref 8.7–10.2)
Chloride: 106 mmol/L (ref 96–106)
Creatinine, Ser: 1.1 mg/dL (ref 0.76–1.27)
GFR calc Af Amer: 88 mL/min/{1.73_m2} (ref >59)
GFR calc non Af Amer: 76 mL/min/{1.73_m2} (ref >59)
Globulin, Total: 2.5 g/dL (ref 1.5–4.5)
Glucose: 91 mg/dL (ref 65–99)
Potassium: 3.8 mmol/L (ref 3.5–5.2)
Sodium: 142 mmol/L (ref 134–144)
Total Protein: 7 g/dL (ref 6.0–8.5)

## 2017-01-12 LAB — LIPID PANEL
Chol/HDL Ratio: 4.3 ratio (ref 0.0–5.0)
Cholesterol, Total: 158 mg/dL (ref 100–199)
HDL: 37 mg/dL — ABNORMAL LOW (ref >39)
LDL Calculated: 49 mg/dL (ref 0–99)
Triglycerides: 359 mg/dL — ABNORMAL HIGH (ref 0–149)
VLDL Cholesterol Cal: 72 mg/dL — ABNORMAL HIGH (ref 5–40)

## 2017-01-15 ENCOUNTER — Telehealth: Payer: Self-pay | Admitting: Family Medicine

## 2017-01-15 NOTE — Telephone Encounter (Signed)
lmtcb

## 2017-01-16 ENCOUNTER — Telehealth: Payer: Self-pay | Admitting: *Deleted

## 2017-01-16 NOTE — Telephone Encounter (Addendum)
TC from [facility] Ref # [id number] Omega 3 is not covered by pts plan They do cover gemfibrozil, Fenofibrate tablets, capsules, acid or nano Please advise

## 2017-01-16 NOTE — Telephone Encounter (Signed)
lmtcb

## 2017-01-17 NOTE — Telephone Encounter (Signed)
Have patient pick up omega-3's over-the-counter

## 2017-01-17 NOTE — Telephone Encounter (Signed)
lmtcb

## 2017-01-17 NOTE — Telephone Encounter (Signed)
[facility] aware pt is going to try OTC Omega 3s

## 2017-04-16 ENCOUNTER — Encounter: Payer: Self-pay | Admitting: Family Medicine

## 2017-04-16 ENCOUNTER — Ambulatory Visit: Payer: BLUE CROSS/BLUE SHIELD | Admitting: Family Medicine

## 2017-04-16 VITALS — BP 129/82 | HR 67 | Temp 96.9°F | Ht 72.0 in | Wt 240.0 lb

## 2017-04-16 DIAGNOSIS — R569 Unspecified convulsions: Secondary | ICD-10-CM | POA: Diagnosis not present

## 2017-04-16 DIAGNOSIS — I10 Essential (primary) hypertension: Secondary | ICD-10-CM | POA: Diagnosis not present

## 2017-04-16 DIAGNOSIS — E785 Hyperlipidemia, unspecified: Secondary | ICD-10-CM

## 2017-04-16 MED ORDER — OLMESARTAN MEDOXOMIL 20 MG PO TABS: 20.0000 mg | ORAL_TABLET | Freq: Every day | ORAL | 0 refills | Status: DC

## 2017-04-16 MED ORDER — PHENOBARBITAL 16.2 MG PO TABS: 32.4000 mg | ORAL_TABLET | Freq: Every day | ORAL | 3 refills | Status: DC

## 2017-04-16 MED ORDER — FENOFIBRATE 48 MG PO TABS: 48.0000 mg | ORAL_TABLET | Freq: Every day | ORAL | 0 refills | Status: DC

## 2017-04-16 MED ORDER — NIACIN ER (ANTIHYPERLIPIDEMIC) 500 MG PO TBCR: 500.0000 mg | EXTENDED_RELEASE_TABLET | Freq: Every day | ORAL | 0 refills | Status: DC

## 2017-04-16 MED ORDER — PHENOBARBITAL 100 MG PO TABS: 100.0000 mg | ORAL_TABLET | Freq: Every day | ORAL | 3 refills | Status: DC

## 2017-04-16 MED ORDER — OMEGA-3-ACID ETHYL ESTERS 1 G PO CAPS: 2.0000 | ORAL_CAPSULE | Freq: Two times a day (BID) | ORAL | 0 refills | Status: DC

## 2017-04-16 MED ORDER — ICOSAPENT ETHYL 1 G PO CAPS: 2.0000 | ORAL_CAPSULE | Freq: Two times a day (BID) | ORAL | 5 refills | Status: DC

## 2017-04-16 MED ORDER — ROSUVASTATIN CALCIUM 20 MG PO TABS: 20.0000 mg | ORAL_TABLET | Freq: Every day | ORAL | 0 refills | Status: DC

## 2017-04-16 NOTE — Progress Notes (Signed)
BP 129/82   Pulse 67   Temp (!) 96.9 F (36.1 C) (Oral)   Ht 6' (1.829 m)   Wt 240 lb (108.9 kg)   BMI 32.55 kg/m    Subjective:    Patient ID: Christopher Salas, male    DOB: [birth year], 52 y.o.   MRN: [id number]  HPI: PRANEEL HAISLEY is a 52 y.o. male presenting on 04/16/2017 for Hyperlipidemia (3 mo) and Hypertension   HPI Hyperlipidemia Patient is coming in for recheck of his hyperlipidemia. The patient is currently taking fenofibrate and niacin and Lovaza and Crestor. They deny any issues with myalgias or history of liver damage from it. They deny any focal numbness or weakness or chest pain.   Hypertension Patient is currently on olmesartan, and their blood pressure today is 129/82. Patient denies any lightheadedness or dizziness. Patient denies headaches, blurred vision, chest pains, shortness of breath, or weakness. Denies any side effects from medication and is content with current medication.   Seizure recheck Patient is coming in for refill of seizure medication, patient is currently on phenobarbital and has been on phenobarbital for quite many years and has not had any issues with seizures since he has been on it and has been doing very well.  He denies any major side effects from medication.  Relevant past medical, surgical, family and social history reviewed and updated as indicated. Interim medical history since our last visit reviewed. Allergies and medications reviewed and updated.  Review of Systems  Constitutional: Negative for chills and fever.  Respiratory: Negative for shortness of breath and wheezing.   Cardiovascular: Negative for chest pain and leg swelling.  Musculoskeletal: Negative for back pain and gait problem.  Skin: Negative for rash.  Neurological: Negative for dizziness, seizures, weakness and headaches.  All other systems reviewed and are negative.   Per HPI unless specifically indicated above   Allergies as of 04/16/2017   No Known Allergies     Medication List        Accurate as of 04/16/17  4:46 PM. Always use your most recent med list.          diclofenac sodium 1 % Gel Commonly known as:  VOLTAREN Apply 2 g topically 4 (four) times daily.   fenofibrate 48 MG tablet Commonly known as:  TRICOR Take 1 tablet (48 mg total) by mouth daily.   HYDROcodone-acetaminophen 5-325 MG tablet Commonly known as:  NORCO Take 1 tablet by mouth every 6 (six) hours as needed for moderate pain.   ibuprofen 800 MG tablet Commonly known as:  ADVIL,MOTRIN Take 800 mg by mouth every 8 (eight) hours as needed.   Icosapent Ethyl 1 g Caps Take 2 tablets by mouth 2 (two) times daily.   meloxicam 15 MG tablet Commonly known as:  MOBIC Take 1 tablet (15 mg total) by mouth daily.   niacin 500 MG CR tablet Commonly known as:  NIASPAN Take 1 tablet (500 mg total) by mouth daily with breakfast.   olmesartan 20 MG tablet Commonly known as:  BENICAR Take 1 tablet (20 mg total) by mouth daily.   omega-3 acid ethyl esters 1 g capsule Commonly known as:  LOVAZA Take 2 capsules (2 g total) by mouth 2 (two) times daily.   phenobarbital 16.2 MG tablet Commonly known as:  LUMINAL Take 2 tablets (32.4 mg total) by mouth at bedtime. Take 2 tablets (32.4 mg) at bedtime   PHENObarbital 100 MG tablet Commonly known as:  LUMINAL Take  1 tablet (100 mg total) by mouth at bedtime.   rosuvastatin 20 MG tablet Commonly known as:  CRESTOR Take 1 tablet (20 mg total) by mouth daily.   triamcinolone cream 0.1 % Commonly known as:  KENALOG Apply 1 application topically 2 (two) times daily.          Objective:    BP 129/82   Pulse 67   Temp (!) 96.9 F (36.1 C) (Oral)   Ht 6' (1.829 m)   Wt 240 lb (108.9 kg)   BMI 32.55 kg/m   Wt Readings from Last 3 Encounters:  04/16/17 240 lb (108.9 kg)  01/11/17 235 lb (106.6 kg)  07/12/16 234 lb (106.1 kg)    Physical Exam  Constitutional: He is oriented to person, place, and time. He appears  well-developed and well-nourished. No distress.  Eyes: Conjunctivae are normal. No scleral icterus.  Neck: Neck supple. No thyromegaly present.  Cardiovascular: Normal rate, regular rhythm, normal heart sounds and intact distal pulses.  No murmur heard. Pulmonary/Chest: Effort normal and breath sounds normal. No respiratory distress. He has no wheezes.  Musculoskeletal: Normal range of motion. He exhibits no edema.  Lymphadenopathy:    He has no cervical adenopathy.  Neurological: He is alert and oriented to person, place, and time. Coordination normal.  Skin: Skin is warm and dry. No rash noted. He is not diaphoretic.  Psychiatric: He has a normal mood and affect. His behavior is normal.  Nursing note and vitals reviewed.     Assessment & Plan:   Problem List Items Addressed This Visit      Cardiovascular and Mediastinum   Hypertension - Primary   Relevant Medications   rosuvastatin (CRESTOR) 20 MG tablet   omega-3 acid ethyl esters (LOVAZA) 1 g capsule   olmesartan (BENICAR) 20 MG tablet   niacin (NIASPAN) 500 MG CR tablet   Icosapent Ethyl 1 g CAPS   fenofibrate (TRICOR) 48 MG tablet   Other Relevant Orders   CMP14+EGFR     Other   Hyperlipidemia   Relevant Medications   rosuvastatin (CRESTOR) 20 MG tablet   omega-3 acid ethyl esters (LOVAZA) 1 g capsule   olmesartan (BENICAR) 20 MG tablet   niacin (NIASPAN) 500 MG CR tablet   Icosapent Ethyl 1 g CAPS   fenofibrate (TRICOR) 48 MG tablet   Other Relevant Orders   Lipid panel   Lipid panel   Seizures (HCC)   Relevant Medications   phenobarbital (LUMINAL) 16.2 MG tablet   PHENObarbital (LUMINAL) 100 MG tablet       Follow up plan: Return in about 3 months (around 07/15/2017), or if symptoms worsen or fail to improve, for Hypertension and seizure disorder.  Counseling provided for all of the vaccine components Orders Placed This Encounter  Procedures  . CMP14+EGFR  . Lipid panel  . Lipid panel    Caryl Pina, MD [facility] 04/16/2017, 4:46 PM

## 2017-04-18 ENCOUNTER — Other Ambulatory Visit: Payer: BLUE CROSS/BLUE SHIELD

## 2017-04-18 DIAGNOSIS — Z129 Encounter for screening for malignant neoplasm, site unspecified: Secondary | ICD-10-CM

## 2017-04-19 LAB — FECAL OCCULT BLOOD, IMMUNOCHEMICAL: Fecal Occult Bld: NEGATIVE

## 2017-04-23 ENCOUNTER — Telehealth: Payer: Self-pay | Admitting: *Deleted

## 2017-04-23 MED ORDER — ICOSAPENT ETHYL 1 G PO CAPS: 2.0000 g | ORAL_CAPSULE | Freq: Two times a day (BID) | ORAL | 11 refills | Status: DC

## 2017-04-23 NOTE — Telephone Encounter (Signed)
Lovaza not covered by pt's insurance Please advise if you want to increase his fenofibrate prescription or do a PA for the [employer] Ref # [id number]

## 2017-04-23 NOTE — Telephone Encounter (Signed)
Left detailed message for pt regarding RX 

## 2017-04-23 NOTE — Telephone Encounter (Signed)
Please let patient know that we have sent Vascepa for him because his Lovaza was not covered.

## 2017-04-24 ENCOUNTER — Telehealth: Payer: Self-pay | Admitting: Family Medicine

## 2017-04-27 ENCOUNTER — Telehealth: Payer: Self-pay | Admitting: *Deleted

## 2017-04-27 NOTE — Telephone Encounter (Signed)
Vascepa & lovaza not covered by insurance Alternatives - Increase dosage of Fenofibrate or Gemfibrozil Please advise [facility]

## 2017-04-30 NOTE — Telephone Encounter (Signed)
Please let patient know that he can take fish oils but I do not think increasing his fenofibrate at this time would be a good option.  If he wants to take fish oils over-the-counter I recommend taking 3-4 g daily

## 2017-04-30 NOTE — Telephone Encounter (Signed)
Left message to call.

## 2017-05-01 NOTE — Telephone Encounter (Signed)
Patient aware and verbalized understanding. °

## 2017-07-23 ENCOUNTER — Other Ambulatory Visit: Payer: Self-pay | Admitting: Family Medicine

## 2017-07-23 DIAGNOSIS — E785 Hyperlipidemia, unspecified: Secondary | ICD-10-CM

## 2017-07-27 ENCOUNTER — Ambulatory Visit: Payer: BLUE CROSS/BLUE SHIELD | Admitting: Family Medicine

## 2017-07-27 ENCOUNTER — Encounter: Payer: Self-pay | Admitting: Family Medicine

## 2017-07-27 VITALS — BP 134/84 | HR 71 | Temp 97.5°F | Ht 72.0 in | Wt 236.0 lb

## 2017-07-27 DIAGNOSIS — R569 Unspecified convulsions: Secondary | ICD-10-CM | POA: Diagnosis not present

## 2017-07-27 DIAGNOSIS — D649 Anemia, unspecified: Secondary | ICD-10-CM

## 2017-07-27 DIAGNOSIS — I10 Essential (primary) hypertension: Secondary | ICD-10-CM

## 2017-07-27 DIAGNOSIS — E785 Hyperlipidemia, unspecified: Secondary | ICD-10-CM

## 2017-07-27 MED ORDER — ROSUVASTATIN CALCIUM 20 MG PO TABS: 20.0000 mg | ORAL_TABLET | Freq: Every day | ORAL | 3 refills | Status: DC

## 2017-07-27 MED ORDER — PHENOBARBITAL 16.2 MG PO TABS: 32.4000 mg | ORAL_TABLET | Freq: Every day | ORAL | 3 refills | Status: DC

## 2017-07-27 MED ORDER — PHENOBARBITAL 100 MG PO TABS: 100.0000 mg | ORAL_TABLET | Freq: Every day | ORAL | 3 refills | Status: DC

## 2017-07-27 MED ORDER — NIACIN ER (ANTIHYPERLIPIDEMIC) 500 MG PO TBCR: 500.0000 mg | EXTENDED_RELEASE_TABLET | Freq: Every day | ORAL | 3 refills | Status: DC

## 2017-07-27 MED ORDER — OMEGA-3-ACID ETHYL ESTERS 1 G PO CAPS: 2.0000 | ORAL_CAPSULE | Freq: Two times a day (BID) | ORAL | 3 refills | Status: DC

## 2017-07-27 MED ORDER — OLMESARTAN MEDOXOMIL 20 MG PO TABS: 20.0000 mg | ORAL_TABLET | Freq: Every day | ORAL | 3 refills | Status: DC

## 2017-07-27 MED ORDER — FENOFIBRATE 48 MG PO TABS: 48.0000 mg | ORAL_TABLET | Freq: Every day | ORAL | 3 refills | Status: DC

## 2017-07-27 NOTE — Progress Notes (Signed)
BP 134/84   Pulse 71   Temp (!) 97.5 F (36.4 C) (Oral)   Ht 6' (1.829 m)   Wt 236 lb (107 kg)   BMI 32.01 kg/m    Subjective:    Patient ID: Christopher Salas, male    DOB: September 07, 1965, 52 y.o.   MRN: [id number]  HPI: Christopher Salas is a 52 y.o. male presenting on 07/27/2017 for Hyperlipidemia (3 mo); Hypertension; and Hemoglobin low (last two times he has given blood it has been low, taking two iron tablets per day)   HPI Hyperlipidemia Patient is coming in for recheck of his hyperlipidemia. The patient is currently taking Crestor. They deny any issues with myalgias or history of liver damage from it. They deny any focal numbness or weakness or chest pain.   Hypertension Patient is currently on olmesartan, and their blood pressure today is 134/84. Patient denies any lightheadedness or dizziness. Patient denies headaches, blurred vision, chest pains, shortness of breath, or weakness. Denies any side effects from medication and is content with current medication.   Low hemoglobin Patient had slightly low hemoglobin to be able to give blood and would like to have it checked and see where he needs to be.  He denies any lightheadedness or dizziness chest pain.  Seizure disorder Patient is coming in for recheck on seizure medication.  He has not had seizures in quite some time and is stable on his medication and just needs a refill.  Relevant past medical, surgical, family and social history reviewed and updated as indicated. Interim medical history since our last visit reviewed. Allergies and medications reviewed and updated.  Review of Systems  Constitutional: Negative for chills and fever.  Eyes: Negative for discharge.  Respiratory: Negative for shortness of breath and wheezing.   Cardiovascular: Negative for chest pain and leg swelling.  Musculoskeletal: Negative for back pain and gait problem.  Skin: Negative for rash.  Neurological: Negative for dizziness, weakness,  light-headedness and numbness.  All other systems reviewed and are negative.   Per HPI unless specifically indicated above   Allergies as of 07/27/2017   No Known Allergies     Medication List        Accurate as of 07/27/17  4:23 PM. Always use your most recent med list.          fenofibrate 48 MG tablet Commonly known as:  TRICOR Take 1 tablet (48 mg total) by mouth daily.   ibuprofen 800 MG tablet Commonly known as:  ADVIL,MOTRIN Take 800 mg by mouth every 8 (eight) hours as needed.   Icosapent Ethyl 1 g Caps Commonly known as:  VASCEPA Take 2 g by mouth 2 (two) times daily.   IRON-150 PO Take 2 tablets by mouth daily.   meloxicam 15 MG tablet Commonly known as:  MOBIC Take 1 tablet (15 mg total) by mouth daily.   niacin 500 MG CR tablet Commonly known as:  NIASPAN TAKE 1 TABLET DAILY WITH BREAKFAST   olmesartan 20 MG tablet Commonly known as:  BENICAR Take 1 tablet (20 mg total) by mouth daily.   omega-3 acid ethyl esters 1 g capsule Commonly known as:  LOVAZA Take 2 capsules (2 g total) by mouth 2 (two) times daily.   phenobarbital 16.2 MG tablet Commonly known as:  LUMINAL Take 2 tablets (32.4 mg total) by mouth at bedtime. Take 2 tablets (32.4 mg) at bedtime   PHENObarbital 100 MG tablet Commonly known as:  LUMINAL Take 1  tablet (100 mg total) by mouth at bedtime.   rosuvastatin 20 MG tablet Commonly known as:  CRESTOR Take 1 tablet (20 mg total) by mouth daily.   triamcinolone cream 0.1 % Commonly known as:  KENALOG Apply 1 application topically 2 (two) times daily.          Objective:    BP 134/84   Pulse 71   Temp (!) 97.5 F (36.4 C) (Oral)   Ht 6' (1.829 m)   Wt 236 lb (107 kg)   BMI 32.01 kg/m   Wt Readings from Last 3 Encounters:  07/27/17 236 lb (107 kg)  04/16/17 240 lb (108.9 kg)  01/11/17 235 lb (106.6 kg)    Physical Exam  Constitutional: He is oriented to person, place, and time. He appears well-developed and  well-nourished. No distress.  Eyes: Pupils are equal, round, and reactive to light. Conjunctivae and EOM are normal. No scleral icterus.  Neck: Neck supple. No thyromegaly present.  Cardiovascular: Normal rate, regular rhythm, normal heart sounds and intact distal pulses.  No murmur heard. Pulmonary/Chest: Effort normal and breath sounds normal. No respiratory distress. He has no wheezes.  Musculoskeletal: Normal range of motion. He exhibits no edema.  Lymphadenopathy:    He has no cervical adenopathy.  Neurological: He is alert and oriented to person, place, and time. Coordination normal.  Skin: Skin is warm and dry. No rash noted. He is not diaphoretic.  Psychiatric: He has a normal mood and affect. His behavior is normal.  Nursing note and vitals reviewed.       Assessment & Plan:   Problem List Items Addressed This Visit      Cardiovascular and Mediastinum   Hypertension   Relevant Medications   olmesartan (BENICAR) 20 MG tablet   omega-3 acid ethyl esters (LOVAZA) 1 g capsule   niacin (NIASPAN) 500 MG CR tablet   rosuvastatin (CRESTOR) 20 MG tablet   fenofibrate (TRICOR) 48 MG tablet   Other Relevant Orders   CMP14+EGFR (Completed)     Other   Hyperlipidemia   Relevant Medications   olmesartan (BENICAR) 20 MG tablet   omega-3 acid ethyl esters (LOVAZA) 1 g capsule   niacin (NIASPAN) 500 MG CR tablet   rosuvastatin (CRESTOR) 20 MG tablet   fenofibrate (TRICOR) 48 MG tablet   Other Relevant Orders   Lipid panel (Completed)   Seizures ([place]) - Primary   Relevant Medications   phenobarbital (LUMINAL) 16.2 MG tablet   PHENObarbital (LUMINAL) 100 MG tablet   Other Relevant Orders   CBC with Differential/Platelet (Completed)    Other Visit Diagnoses    Low hemoglobin       Relevant Orders   CBC with Differential/Platelet (Completed)       Follow up plan: Return in about 6 months (around 01/27/2018), or if symptoms worsen or fail to improve, for Recheck  hypertension and seizures.  Counseling provided for all of the vaccine components No orders of the defined types were placed in this encounter.   Caryl Pina, MD [facility] 07/27/2017, 4:23 PM

## 2017-07-28 LAB — CBC WITH DIFFERENTIAL/PLATELET
Basophils Absolute: 0 10*3/uL (ref 0.0–0.2)
Basos: 0 %
EOS (ABSOLUTE): 0.2 10*3/uL (ref 0.0–0.4)
Eos: 5 %
Hematocrit: 36.8 % — ABNORMAL LOW (ref 37.5–51.0)
Hemoglobin: 12.7 g/dL — ABNORMAL LOW (ref 13.0–17.7)
Immature Grans (Abs): 0 10*3/uL (ref 0.0–0.1)
Immature Granulocytes: 0 %
Lymphocytes Absolute: 1.3 10*3/uL (ref 0.7–3.1)
Lymphs: 28 %
MCH: 32.2 pg (ref 26.6–33.0)
MCHC: 34.5 g/dL (ref 31.5–35.7)
MCV: 93 fL (ref 79–97)
Monocytes Absolute: 0.3 10*3/uL (ref 0.1–0.9)
Monocytes: 7 %
Neutrophils Absolute: 2.7 10*3/uL (ref 1.4–7.0)
Neutrophils: 60 %
Platelets: 237 10*3/uL (ref 150–379)
RBC: 3.95 x10E6/uL — ABNORMAL LOW (ref 4.14–5.80)
RDW: 13 % (ref 12.3–15.4)
WBC: 4.6 10*3/uL (ref 3.4–10.8)

## 2017-07-28 LAB — CMP14+EGFR
ALT: 14 IU/L (ref 0–44)
AST: 21 IU/L (ref 0–40)
Albumin/Globulin Ratio: 2 (ref 1.2–2.2)
Albumin: 4.7 g/dL (ref 3.5–5.5)
Alkaline Phosphatase: 109 IU/L (ref 39–117)
BUN/Creatinine Ratio: 12 (ref 9–20)
BUN: 12 mg/dL (ref 6–24)
Bilirubin Total: 0.2 mg/dL (ref 0.0–1.2)
CO2: 23 mmol/L (ref 20–29)
Calcium: 9.5 mg/dL (ref 8.7–10.2)
Chloride: 104 mmol/L (ref 96–106)
Creatinine, Ser: 0.97 mg/dL (ref 0.76–1.27)
GFR calc Af Amer: 103 mL/min/{1.73_m2} (ref >59)
GFR calc non Af Amer: 89 mL/min/{1.73_m2} (ref >59)
Globulin, Total: 2.4 g/dL (ref 1.5–4.5)
Glucose: 94 mg/dL (ref 65–99)
Potassium: 4 mmol/L (ref 3.5–5.2)
Sodium: 141 mmol/L (ref 134–144)
Total Protein: 7.1 g/dL (ref 6.0–8.5)

## 2017-07-28 LAB — LIPID PANEL
Chol/HDL Ratio: 3.8 ratio (ref 0.0–5.0)
Cholesterol, Total: 129 mg/dL (ref 100–199)
HDL: 34 mg/dL — ABNORMAL LOW (ref >39)
LDL Calculated: 40 mg/dL (ref 0–99)
Triglycerides: 276 mg/dL — ABNORMAL HIGH (ref 0–149)
VLDL Cholesterol Cal: 55 mg/dL — ABNORMAL HIGH (ref 5–40)

## 2017-08-21 ENCOUNTER — Ambulatory Visit (INDEPENDENT_AMBULATORY_CARE_PROVIDER_SITE_OTHER): Payer: BLUE CROSS/BLUE SHIELD

## 2017-08-21 ENCOUNTER — Ambulatory Visit: Payer: BLUE CROSS/BLUE SHIELD | Admitting: Family Medicine

## 2017-08-21 ENCOUNTER — Encounter: Payer: Self-pay | Admitting: Family Medicine

## 2017-08-21 VITALS — BP 136/88 | HR 72 | Temp 97.0°F | Ht 71.0 in | Wt 231.0 lb

## 2017-08-21 DIAGNOSIS — M545 Low back pain: Secondary | ICD-10-CM | POA: Diagnosis not present

## 2017-08-21 DIAGNOSIS — M5442 Lumbago with sciatica, left side: Secondary | ICD-10-CM | POA: Diagnosis not present

## 2017-08-21 MED ORDER — OXYCODONE-ACETAMINOPHEN 5-325 MG PO TABS: 1.0000 | ORAL_TABLET | Freq: Four times a day (QID) | ORAL | 0 refills | Status: DC | PRN

## 2017-08-21 MED ORDER — PREDNISONE 10 MG (21) PO TBPK: ORAL_TABLET | ORAL | 0 refills | Status: DC

## 2017-08-21 MED ORDER — METHYLPREDNISOLONE ACETATE 80 MG/ML IJ SUSP
80.0000 mg | Freq: Once | INTRAMUSCULAR | Status: AC
  Administered 2017-08-21: 80 mg via INTRAMUSCULAR

## 2017-08-21 MED ORDER — OXYCODONE-ACETAMINOPHEN 10-325 MG PO TABS: 0.5000 | ORAL_TABLET | Freq: Four times a day (QID) | ORAL | 0 refills | Status: DC | PRN

## 2017-08-21 NOTE — Addendum Note (Signed)
Addended by: Janora Norlander on: 08/21/2017 09:40 AM   Modules accepted: Orders

## 2017-08-21 NOTE — Patient Instructions (Signed)
Sciatica Sciatica is pain, numbness, weakness, or tingling along the path of the sciatic nerve. The sciatic nerve starts in the lower back and runs down the back of each leg. The nerve controls the muscles in the lower leg and in the back of the knee. It also provides feeling (sensation) to the back of the thigh, the lower leg, and the sole of the foot. Sciatica is a symptom of another medical condition that pinches or puts pressure on the sciatic nerve. Generally, sciatica only affects one side of the body. Sciatica usually goes away on its own or with treatment. In some cases, sciatica may keep coming back (recur). What are the causes? This condition is caused by pressure on the sciatic nerve, or pinching of the sciatic nerve. This may be the result of:  A disk in between the bones of the spine (vertebrae) bulging out too far (herniated disk).  Age-related changes in the spinal disks (degenerative disk disease).  A pain disorder that affects a muscle in the buttock (piriformis syndrome).  Extra bone growth (bone spur) near the sciatic nerve.  An injury or break (fracture) of the pelvis.  Pregnancy.  Tumor (rare).  What increases the risk? The following factors may make you more likely to develop this condition:  Playing sports that place pressure or stress on the spine, such as football or weight lifting.  Having poor strength and flexibility.  A history of back injury.  A history of back surgery.  Sitting for long periods of time.  Doing activities that involve repetitive bending or lifting.  Obesity.  What are the signs or symptoms? Symptoms can vary from mild to very severe, and they may include:  Any of these problems in the lower back, leg, hip, or buttock: ? Mild tingling or dull aches. ? Burning sensations. ? Sharp pains.  Numbness in the back of the calf or the sole of the foot.  Leg weakness.  Severe back pain that makes movement difficult.  These  symptoms may get worse when you cough, sneeze, or laugh, or when you sit or stand for long periods of time. Being overweight may also make symptoms worse. In some cases, symptoms may recur over time. How is this diagnosed? This condition may be diagnosed based on:  Your symptoms.  A physical exam. Your health care provider may ask you to do certain movements to check whether those movements trigger your symptoms.  You may have tests, including: ? Blood tests. ? X-rays. ? MRI. ? CT scan.  How is this treated? In many cases, this condition improves on its own, without any treatment. However, treatment may include:  Reducing or modifying physical activity during periods of pain.  Exercising and stretching to strengthen your abdomen and improve the flexibility of your spine.  Icing and applying heat to the affected area.  Medicines that help: ? To relieve pain and swelling. ? To relax your muscles.  Injections of medicines that help to relieve pain, irritation, and inflammation around the sciatic nerve (steroids).  Surgery.  Follow these instructions at home: Medicines  Take over-the-counter and prescription medicines only as told by your health care provider.  Do not drive or operate heavy machinery while taking prescription pain medicine. Managing pain  If directed, apply ice to the affected area. ? Put ice in a plastic bag. ? Place a towel between your skin and the bag. ? Leave the ice on for 20 minutes, 2-3 times a day.  After icing, apply   heat to the affected area before you exercise or as often as told by your health care provider. Use the heat source that your health care provider recommends, such as a moist heat pack or a heating pad. ? Place a towel between your skin and the heat source. ? Leave the heat on for 20-30 minutes. ? Remove the heat if your skin turns bright red. This is especially important if you are unable to feel pain, heat, or cold. You may have a  greater risk of getting burned. Activity  Return to your normal activities as told by your health care provider. Ask your health care provider what activities are safe for you. ? Avoid activities that make your symptoms worse.  Take brief periods of rest throughout the day. Resting in a lying or standing position is usually better than sitting to rest. ? When you rest for longer periods, mix in some mild activity or stretching between periods of rest. This will help to prevent stiffness and pain. ? Avoid sitting for long periods of time without moving. Get up and move around at least one time each hour.  Exercise and stretch regularly, as told by your health care provider.  Do not lift anything that is heavier than 10 lb (4.5 kg) while you have symptoms of sciatica. When you do not have symptoms, you should still avoid heavy lifting, especially repetitive heavy lifting.  When you lift objects, always use proper lifting technique, which includes: ? Bending your knees. ? Keeping the load close to your body. ? Avoiding twisting. General instructions  Use good posture. ? Avoid leaning forward while sitting. ? Avoid hunching over while standing.  Maintain a healthy weight. Excess weight puts extra stress on your back and makes it difficult to maintain good posture.  Wear supportive, comfortable shoes. Avoid wearing high heels.  Avoid sleeping on a mattress that is too soft or too hard. A mattress that is firm enough to support your back when you sleep may help to reduce your pain.  Keep all follow-up visits as told by your health care provider. This is important. Contact a health care provider if:  You have pain that wakes you up when you are sleeping.  You have pain that gets worse when you lie down.  Your pain is worse than you have experienced in the past.  Your pain lasts longer than 4 weeks.  You experience unexplained weight loss. Get help right away if:  You lose control  of your bowel or bladder (incontinence).  You have: ? Weakness in your lower back, pelvis, buttocks, or legs that gets worse. ? Redness or swelling of your back. ? A burning sensation when you urinate. This information is not intended to replace advice given to you by your health care provider. Make sure you discuss any questions you have with your health care provider. Document Released: 02/28/2001 Document Revised: 08/10/2015 Document Reviewed: 11/13/2014 [employer] Interactive Patient Education  2018 [employer]  

## 2017-08-21 NOTE — Progress Notes (Addendum)
Subjective: CC: "pulled muscle in leg" PCP: Dettinger, Fransisca Kaufmann, MD EXH:BZJIR D Okray is a 52 y.o. male presenting to clinic today for:  1. MSK concern Patient reports acute onset of left-sided lower extremity pain, citing that it runs from his left buttock to the back of his knee.  He notes associated numbness of the anterior left thigh.  He describes the pain as severe such that he is unable to sit comfortably.  He reports associated weakness of the left lower leg.  Denies any preceding injury, overt low back pain, saddle anesthesia, fecal incontinence or urinary retention.  He has been using Tylenol, gabapentin and Motrin with little improvement in symptoms.   ROS: Per HPI  No Known Allergies Past Medical History:  Diagnosis Date  . Hyperlipidemia   . Hypertension   . Seizures ([place])     Current Outpatient Medications:  .  Fe Bisgly-Succ-C-Thre-B12-FA (IRON-150 PO), Take 2 tablets by mouth daily., Disp: , Rfl:  .  fenofibrate (TRICOR) 48 MG tablet, Take 1 tablet (48 mg total) by mouth daily., Disp: 90 tablet, Rfl: 3 .  ibuprofen (ADVIL,MOTRIN) 800 MG tablet, Take 800 mg by mouth every 8 (eight) hours as needed., Disp: , Rfl:  .  Icosapent Ethyl (VASCEPA) 1 g CAPS, Take 2 g by mouth 2 (two) times daily., Disp: 120 capsule, Rfl: 11 .  meloxicam (MOBIC) 15 MG tablet, Take 1 tablet (15 mg total) by mouth daily., Disp: 10 tablet, Rfl: 0 .  niacin (NIASPAN) 500 MG CR tablet, Take 1 tablet (500 mg total) by mouth daily with breakfast., Disp: 90 tablet, Rfl: 3 .  olmesartan (BENICAR) 20 MG tablet, Take 1 tablet (20 mg total) by mouth daily., Disp: 90 tablet, Rfl: 3 .  omega-3 acid ethyl esters (LOVAZA) 1 g capsule, Take 2 capsules (2 g total) by mouth 2 (two) times daily., Disp: 360 capsule, Rfl: 3 .  PHENObarbital (LUMINAL) 100 MG tablet, Take 1 tablet (100 mg total) by mouth at bedtime., Disp: 90 tablet, Rfl: 3 .  phenobarbital (LUMINAL) 16.2 MG tablet, Take 2 tablets (32.4 mg total) by  mouth at bedtime. Take 2 tablets (32.4 mg) at bedtime, Disp: 180 tablet, Rfl: 3 .  rosuvastatin (CRESTOR) 20 MG tablet, Take 1 tablet (20 mg total) by mouth daily., Disp: 90 tablet, Rfl: 3 .  triamcinolone cream (KENALOG) 0.1 %, Apply 1 application topically 2 (two) times daily., Disp: 30 g, Rfl: 0 Social History   Socioeconomic History  . Marital status: Divorced    Spouse name: Not on file  . Number of children: Not on file  . Years of education: Not on file  . Highest education level: Not on file  Occupational History  . Not on file  Social Needs  . Financial resource strain: Not on file  . Food insecurity:    Worry: Not on file    Inability: Not on file  . Transportation needs:    Medical: Not on file    Non-medical: Not on file  Tobacco Use  . Smoking status: Never Smoker  . Smokeless tobacco: Never Used  Substance and Sexual Activity  . Alcohol use: No  . Drug use: No  . Sexual activity: Not on file  Lifestyle  . Physical activity:    Days per week: Not on file    Minutes per session: Not on file  . Stress: Not on file  Relationships  . Social connections:    Talks on phone: Not on file  Gets together: Not on file    Attends religious service: Not on file    Active member of club or organization: Not on file    Attends meetings of clubs or organizations: Not on file    Relationship status: Not on file  . Intimate partner violence:    Fear of current or ex partner: Not on file    Emotionally abused: Not on file    Physically abused: Not on file    Forced sexual activity: Not on file  Other Topics Concern  . Not on file  Social History Narrative  . Not on file   Family History  Problem Relation Age of Onset  . Emphysema Mother   . Diabetes Father     Objective: Office vital signs reviewed. BP 136/88   Pulse 72   Temp (!) 97 F (36.1 C) (Oral)   Ht 5\' 11"  (1.803 m)   Wt 231 lb (104.8 kg)   BMI 32.22 kg/m   Physical Examination:  General:  Awake, alert, well nourished, No acute distress but uncomfortable appearing HEENT: Normal, MMM, sclera white Pulm: normal work of breathing on room air Extremities: warm, well perfused, No edema, cyanosis or clubbing; +2 pulses bilaterally MSK: antalgic gait and station  Lumbar spine: Limited active range of motion in flexion.  Extension is preserved.  Rotation is preserved.  No midline tenderness to palpation.  No paraspinal muscle tenderness to palpation.  No palpable bony abnormalities.  He has a positive straight leg raise on the left.   Left lower extremity: Weakness appreciated with left hip flexion. Skin: dry; intact; no rashes or lesions Neuro: Decreased light touch sensation to the anterior thigh in a L3-4 distribution.  Heel and toe walks normal.  Assessment/ Plan: 52 y.o. male   1. Acute left-sided low back pain with left-sided sciatica Concern for herniated disc with associated nerve impingement.  His physical exam was remarkable for weakness with left hip flexion and decreased sensation in the anterior thigh and L3-4 distribution.  No red flags but I do think that he warrants further evaluation.  X-rays were obtained but were fairly unremarkable with only mild degenerative changes appreciated.  Will likely need MRI at some point but patient wishes to defer this to his orthopedic's opinion.  He was given a dose of Depo-Medrol 80 here in office.  He will start prednisone tomorrow.  Percocet prescribed to use sparingly.  Caution sedation.  Work note provided out of work until he is evaluated by Ortho.  Strict return precautions and reasons for emergent evaluation the emergency department discussed.  Patient to follow-up as needed. - methylPREDNISolone acetate (DEPO-MEDROL) injection 80 mg - DG Lumbar Spine 2-3 Views; Future - Ambulatory referral to Orthopedic Surgery   Orders Placed This Encounter  Procedures  . DG Lumbar Spine 2-3 Views    Standing Status:   Future    Number of  Occurrences:   1    Standing Expiration Date:   10/21/2018    Order Specific Question:   Reason for Exam (SYMPTOM  OR DIAGNOSIS REQUIRED)    Answer:   back pain    Order Specific Question:   Preferred imaging location?    Answer:   Internal  . Ambulatory referral to Orthopedic Surgery    Referral Priority:   Routine    Referral Type:   Surgical    Referral Reason:   Specialty Services Required    Requested Specialty:   Orthopedic Surgery    Number  of Visits Requested:   1   Meds ordered this encounter  Medications  . methylPREDNISolone acetate (DEPO-MEDROL) injection 80 mg  . predniSONE (STERAPRED UNI-PAK 21 TAB) 10 MG (21) TBPK tablet    Sig: As directed x 6 days    Dispense:  21 tablet    Refill:  0  . DISCONTD: oxyCODONE-acetaminophen (PERCOCET) 10-325 MG tablet    Sig: Take 0.5-1 tablets by mouth every 6 (six) hours as needed for pain.    Dispense:  20 tablet    Refill:  0  . oxyCODONE-acetaminophen (PERCOCET) 5-325 MG tablet    Sig: Take 1 tablet by mouth every 6 (six) hours as needed for severe pain.    Dispense:  20 tablet    Refill:  0    Disregard previous Rx for Percocet 10    The Narcotic Database has been reviewed.  There were no red flags.    Janora Norlander, DO [place] [phone]

## 2017-08-27 DIAGNOSIS — M1612 Unilateral primary osteoarthritis, left hip: Secondary | ICD-10-CM | POA: Diagnosis not present

## 2017-08-27 DIAGNOSIS — M5126 Other intervertebral disc displacement, lumbar region: Secondary | ICD-10-CM | POA: Diagnosis not present

## 2017-08-30 DIAGNOSIS — M545 Low back pain: Secondary | ICD-10-CM | POA: Diagnosis not present

## 2017-09-04 DIAGNOSIS — M5126 Other intervertebral disc displacement, lumbar region: Secondary | ICD-10-CM | POA: Diagnosis not present

## 2017-09-04 DIAGNOSIS — M1612 Unilateral primary osteoarthritis, left hip: Secondary | ICD-10-CM | POA: Diagnosis not present

## 2017-09-07 ENCOUNTER — Other Ambulatory Visit: Payer: Self-pay | Admitting: Orthopedic Surgery

## 2017-09-07 DIAGNOSIS — M5416 Radiculopathy, lumbar region: Secondary | ICD-10-CM

## 2017-09-10 ENCOUNTER — Ambulatory Visit
Admission: RE | Admit: 2017-09-10 | Discharge: 2017-09-10 | Disposition: A | Discharge status: Home / Self Care | Payer: Self-pay | Source: Ambulatory Visit | Attending: Orthopedic Surgery | Admitting: Orthopedic Surgery

## 2017-09-10 DIAGNOSIS — M5416 Radiculopathy, lumbar region: Secondary | ICD-10-CM

## 2017-09-10 DIAGNOSIS — M47817 Spondylosis without myelopathy or radiculopathy, lumbosacral region: Secondary | ICD-10-CM | POA: Diagnosis not present

## 2017-09-10 MED ORDER — IOPAMIDOL (ISOVUE-M 200) INJECTION 41%
1.0000 mL | Freq: Once | INTRAMUSCULAR | Status: AC
  Administered 2017-09-10: 1 mL via EPIDURAL

## 2017-09-10 MED ORDER — METHYLPREDNISOLONE ACETATE 40 MG/ML INJ SUSP (RADIOLOG
120.0000 mg | Freq: Once | INTRAMUSCULAR | Status: AC
  Administered 2017-09-10: 120 mg via EPIDURAL

## 2017-09-10 NOTE — Discharge Instructions (Signed)

## 2017-09-13 ENCOUNTER — Other Ambulatory Visit: Payer: Self-pay

## 2017-09-28 ENCOUNTER — Telehealth: Payer: Self-pay | Admitting: *Deleted

## 2017-09-28 DIAGNOSIS — R569 Unspecified convulsions: Secondary | ICD-10-CM

## 2017-09-28 MED ORDER — PHENOBARBITAL 100 MG PO TABS: 100.0000 mg | ORAL_TABLET | Freq: Every day | ORAL | 3 refills | Status: DC

## 2017-09-28 MED ORDER — PHENOBARBITAL 16.2 MG PO TABS: 32.4000 mg | ORAL_TABLET | Freq: Every day | ORAL | 3 refills | Status: DC

## 2017-09-28 NOTE — Telephone Encounter (Signed)
Sent in medicines for him

## 2017-09-28 NOTE — Telephone Encounter (Signed)
Patient aware.

## 2017-10-02 ENCOUNTER — Other Ambulatory Visit: Payer: Self-pay | Admitting: Orthopedic Surgery

## 2017-10-02 DIAGNOSIS — M5416 Radiculopathy, lumbar region: Secondary | ICD-10-CM

## 2017-10-02 DIAGNOSIS — M5126 Other intervertebral disc displacement, lumbar region: Secondary | ICD-10-CM | POA: Diagnosis not present

## 2017-10-02 DIAGNOSIS — M1612 Unilateral primary osteoarthritis, left hip: Secondary | ICD-10-CM | POA: Diagnosis not present

## 2017-10-02 DIAGNOSIS — Z0289 Encounter for other administrative examinations: Secondary | ICD-10-CM

## 2017-10-10 ENCOUNTER — Ambulatory Visit
Admission: RE | Admit: 2017-10-10 | Discharge: 2017-10-10 | Disposition: A | Discharge status: Home / Self Care | Payer: BLUE CROSS/BLUE SHIELD | Source: Ambulatory Visit | Attending: Orthopedic Surgery | Admitting: Orthopedic Surgery

## 2017-10-10 DIAGNOSIS — M5126 Other intervertebral disc displacement, lumbar region: Secondary | ICD-10-CM | POA: Diagnosis not present

## 2017-10-10 DIAGNOSIS — M5416 Radiculopathy, lumbar region: Secondary | ICD-10-CM

## 2017-10-10 MED ORDER — METHYLPREDNISOLONE ACETATE 40 MG/ML INJ SUSP (RADIOLOG
120.0000 mg | Freq: Once | INTRAMUSCULAR | Status: AC
  Administered 2017-10-10: 120 mg via EPIDURAL

## 2017-10-10 MED ORDER — IOPAMIDOL (ISOVUE-M 200) INJECTION 41%
1.0000 mL | Freq: Once | INTRAMUSCULAR | Status: AC
  Administered 2017-10-10: 1 mL via EPIDURAL

## 2017-10-23 DIAGNOSIS — M5126 Other intervertebral disc displacement, lumbar region: Secondary | ICD-10-CM | POA: Diagnosis not present

## 2017-10-23 DIAGNOSIS — M1612 Unilateral primary osteoarthritis, left hip: Secondary | ICD-10-CM | POA: Diagnosis not present

## 2017-10-25 DIAGNOSIS — M5416 Radiculopathy, lumbar region: Secondary | ICD-10-CM | POA: Diagnosis not present

## 2017-10-30 DIAGNOSIS — M5126 Other intervertebral disc displacement, lumbar region: Secondary | ICD-10-CM | POA: Diagnosis not present

## 2017-12-10 ENCOUNTER — Encounter: Payer: Self-pay | Admitting: Physical Therapy

## 2017-12-10 ENCOUNTER — Ambulatory Visit: Payer: BLUE CROSS/BLUE SHIELD | Attending: Physician Assistant | Admitting: Physical Therapy

## 2017-12-10 ENCOUNTER — Other Ambulatory Visit: Payer: Self-pay

## 2017-12-10 DIAGNOSIS — M6281 Muscle weakness (generalized): Secondary | ICD-10-CM

## 2017-12-10 DIAGNOSIS — M79605 Pain in left leg: Secondary | ICD-10-CM | POA: Diagnosis not present

## 2017-12-10 DIAGNOSIS — R262 Difficulty in walking, not elsewhere classified: Secondary | ICD-10-CM

## 2017-12-10 NOTE — Therapy (Signed)
[facility] [place], [place], [address] Phone: [phone]   Fax:  [phone]  Physical Therapy Evaluation  Patient Details  Name: Christopher Salas MRN: [id number] Date of Birth: 01-07-1966 Referring Provider: Ferne Reus, PA-C   Encounter Date: 12/10/2017  PT End of Session - 12/10/17 2145    Visit Number  1    Number of Visits  12    Date for PT Re-Evaluation  01/21/18    Authorization Type  Progress note every 10th visit    PT Start Time  0945    PT Stop Time  1031    PT Time Calculation (min)  46 min    Activity Tolerance  Patient limited by pain    Behavior During Therapy  [facility] for tasks assessed/performed       Past Medical History:  Diagnosis Date  . Hyperlipidemia   . Hypertension   . Seizures ([place])     Past Surgical History:  Procedure Laterality Date  . HAND SURGERY      There were no vitals filed for this visit.   Subjective Assessment - 12/10/17 2131    Subjective  Patient arrives to physical therapy with a friend with reports of left LE weakness, difficulty walking, and pain in L anterior thigh S/P L3-L4 lateral microdiscectomy on 10/30/17. Patient states he fell about 4 times due to his left knee giving out. Patient reports he needs assistance with coming to standing. Patient reports his left leg is "numb" and intermittently gets "burning shooting pain" in L anterior and medial thigh. Patient reports pain at worst is 8/10 and occurs during the intermittent burning sensation, walking, and bending. Patient reports pain at best is 5/10 with rest and pain medication. Patient's goals are to decrease pain, improve movement, improve strength, and improve ability to perform home and work activities.    Patient is accompained by:  Family member   Friend   Pertinent History  HTN    Limitations  Standing;Walking;House hold activities    How long can you stand comfortably?  5 minutes    How long can you walk  comfortably?  10 minutes with walking stick    Diagnostic tests  MRI & X-Ray prior to surgery    Patient Stated Goals  decrease pain, stop falling    Currently in Pain?  Yes    Pain Score  8     Pain Location  Leg    Pain Orientation  Left;Anterior;Medial    Pain Descriptors / Indicators  Burning;Shooting    Pain Type  Surgical pain    Pain Onset  More than a month ago    Pain Frequency  Intermittent    Aggravating Factors   bending, walking    Pain Relieving Factors  Pain medication, ice    Effect of Pain on Daily Activities  difficulty walking and dressing         [facility] PT Assessment - 12/10/17 0001      Assessment   Medical Diagnosis  Left leg weakness    Referring Provider  Ferne Reus, PA-C    Onset Date/Surgical Date  10/30/17    Next MD Visit  01/21/18    Prior Therapy  no      Precautions   Precautions  Back    Precaution Comments  No lifting or bending      Restrictions   Weight Bearing Restrictions  No      Balance Screen   Has the  patient fallen in the past 6 months  Yes    How many times?  4    Has the patient had a decrease in activity level because of a fear of falling?   Yes    Is the patient reluctant to leave their home because of a fear of falling?   Yes      [place] residence      Prior Function   Level of Independence  Independent with basic ADLs    Vocation  --   on medical leave     Observation/Other Assessments   Skin Integrity  6 cm scar healed well with some dry areas      Sensation   Light Touch  Impaired by gross assessment   diminished sensation at anterior and medial L thigh   Additional Comments  decreased sensation to scar      Posture/Postural Control   Posture/Postural Control  Postural limitations    Postural Limitations  Rounded Shoulders;Forward head;Decreased lumbar lordosis;Flexed trunk      ROM / Strength   AROM / PROM / Strength  AROM;Strength;PROM      AROM   AROM  Assessment Site  --    Right/Left Knee  Left    Left Knee Extension  0    Left Knee Flexion  60   (+) Pain in anterior L thigh     PROM   PROM Assessment Site  Knee    Right/Left Knee  Left    Left Knee Extension  0    Left Knee Flexion  110   (+) Pain in left anterior thigh     Strength   Strength Assessment Site  Hip;Knee    Right/Left Hip  Left    Left Hip Flexion  2+/5    Right/Left Knee  Left    Left Knee Flexion  3-/5    Left Knee Extension  3-/5      Flexibility   Soft Tissue Assessment /Muscle Length  yes      Palpation   Palpation comment  tender to palpation along L anterior thigh      Transfers   Transfers  Supine to Sit    Supine to Sit  5: Supervision    Comments  comes to long sit to transition from supine to sit; improved log roll transfer after education      Ambulation/Gait   Ambulation/Gait  Yes    Ambulation/Gait Assistance  5: Supervision    Assistive device  --   walking stick in left hand   Gait Pattern  Decreased step length - right;Decreased step length - left;Decreased stance time - left;Decreased stride length;Decreased dorsiflexion - left;Decreased weight shift to left;Left foot flat;Left flexed knee in stance;Antalgic;Trunk flexed;Wide base of support      Balance   Balance Assessed  --   (+) romberg, unable to maintain NBOS standing               Objective measurements completed on examination: See above findings.              PT Education - 12/10/17 2145    Education Details  log rolling to get out of bed, transverse abdominus, seated marching    Person(s) Educated  Patient;Other (comment)   friend   Methods  Explanation;Demonstration;Handout    Comprehension  Verbalized understanding;Returned demonstration          PT Long Term Goals - 12/10/17 2114  PT LONG TERM GOAL #1   Title  Patient will be independent with HEP and it's progression.    Time  6    Period  Weeks    Status  New      PT LONG  TERM GOAL #2   Title  Patient will demonstrate left knee AROM to 0-95 or greater to improve gait and stair ambulation.    Time  6    Period  Weeks    Status  New      PT LONG TERM GOAL #3   Title  Patient will report ability to walk for 15 minutes with least restrictive AD and pain less than 5/10 in left LE.    Time  6    Period  Weeks    Status  New      PT LONG TERM GOAL #4   Title  Patient will improve L LE strength to 3+/5 or greater to improve stability during functional tasks.    Time  6    Status  New             Plan - 12/10/17 2107    Clinical Impression Statement  Patient is a 52 year old male who presents to physical therapy with left leg pain and decreased Left LE MMT. Patient noted with diminished sensation along L1-L3 dematomes. Patient noted with diminished left patella and achilles DTR. Patient demonstrated decreased left knee AROM with pain. Patient ambulates with an antalgic gait pattern with wooden walking stick on left side, with decreased left weight bearing, shortened step length, flexed left knee during stance, and decreased L stance time. Patient (+) romberg with increased weight bearing on R LE with L knee flexed. Patient educated on proper log rolling and reviewed precautions to protect back. Patient also educated on importance of using a cane for safety and to improve posture during ambulation, however patient does not want to use a cane as he "does not want to look old and using a cane puts too much pressure on the back." Patient would benefit from skilled physical therapy to address deficits and address patient's goals.      Clinical Presentation  Evolving    Clinical Presentation due to:  not improving    Clinical Decision Making  Moderate    Rehab Potential  Fair    PT Frequency  2x / week    PT Duration  6 weeks    PT Treatment/Interventions  ADLs/Self Care Home Management;Gait training;Stair training;Neuromuscular re-education;Passive range of  motion;Manual techniques;Moist Heat;Cryotherapy;Tax inspector;Therapeutic exercise;Therapeutic activities;Functional mobility training;Ultrasound;Patient/family education    PT Next Visit Plan  review HEP and precautions, Nustep, LE strengthening, modalities PRN for pain relief.    PT Home Exercise Plan  see patient education section    Consulted and Agree with Plan of Care  Patient       Patient will benefit from skilled therapeutic intervention in order to improve the following deficits and impairments:  Abnormal gait, Pain, Decreased balance, Decreased knowledge of precautions, Difficulty walking, Decreased strength, Decreased range of motion, Postural dysfunction, Decreased endurance, Decreased activity tolerance, Decreased safety awareness  Visit Diagnosis: Pain in left leg - Plan: PT plan of care cert/re-cert  Muscle weakness (generalized) - Plan: PT plan of care cert/re-cert  Difficulty in walking, not elsewhere classified - Plan: PT plan of care cert/re-cert     Problem List Patient Active Problem List   Diagnosis Date Noted  . Chronic hand pain, right 03/15/2016  .  Carpal tunnel syndrome 09/22/2013  . Hypertension 07/19/2012  . Hyperlipidemia 07/19/2012  . Seizures ([place]) 07/19/2012   Gabriela Eves, PT, DPT  12/10/2017, 10:50 PM  [facility] [address] [place], [place], [address] Phone: [phone]   Fax:  [phone]  Name: ZYAD BOOMER MRN: [id number] Date of Birth: 08-05-1965

## 2017-12-11 ENCOUNTER — Ambulatory Visit: Payer: BLUE CROSS/BLUE SHIELD | Admitting: *Deleted

## 2017-12-11 DIAGNOSIS — M6281 Muscle weakness (generalized): Secondary | ICD-10-CM | POA: Diagnosis not present

## 2017-12-11 DIAGNOSIS — M79605 Pain in left leg: Secondary | ICD-10-CM | POA: Diagnosis not present

## 2017-12-11 DIAGNOSIS — R262 Difficulty in walking, not elsewhere classified: Secondary | ICD-10-CM

## 2017-12-11 NOTE — Therapy (Signed)
[facility] [place], [place], [address] Phone: [phone]   Fax:  [phone]  Physical Therapy Treatment  Patient Details  Name: Christopher Salas MRN: [id number] Date of Birth: 24-Jun-1965 Referring Provider: Ferne Reus, PA-C   Encounter Date: 12/11/2017  PT End of Session - 12/11/17 1316    Visit Number  2    Number of Visits  12    Date for PT Re-Evaluation  01/21/18    Authorization Type  Progress note every 10th visit    PT Start Time  1300    PT Stop Time  1351    PT Time Calculation (min)  51 min       Past Medical History:  Diagnosis Date  . Hyperlipidemia   . Hypertension   . Seizures ([place])     Past Surgical History:  Procedure Laterality Date  . HAND SURGERY      There were no vitals filed for this visit.  Subjective Assessment - 12/11/17 1313    Subjective  Doing ok, but I keep getting that shooting nerve pain. I fell again this morning    Patient is accompained by:  Family member    Pertinent History  HTN    Limitations  Standing;Walking;House hold activities    How long can you stand comfortably?  5 minutes    How long can you walk comfortably?  10 minutes with walking stick    Diagnostic tests  MRI & X-Ray prior to surgery    Patient Stated Goals  decrease pain, stop falling    Currently in Pain?  Yes    Pain Location  Leg    Pain Orientation  Left    Pain Descriptors / Indicators  Burning;Shooting    Pain Type  Surgical pain    Pain Onset  More than a month ago    Pain Frequency  Intermittent                       [facility] Adult PT Treatment/Exercise - 12/11/17 0001      Exercises   Exercises  Lumbar;Knee/Hip      Lumbar Exercises: Aerobic   Nustep  L5 x 15 mins  UE and LE activity seat 12      Knee/Hip Exercises: Standing   Terminal Knee Extension  Strengthening;Left;3 sets;10 reps   constant V/Cs to keep Pt focused on task   Rocker Board  3 minutes   PF/DF and  calf stretching     Knee/Hip Exercises: Supine   Short Arc Quad Sets  AROM;Left   x 10 mins during VMS     Modalities   Modalities  Electrical Stimulation      Electrical Stimulation   Electrical Stimulation Location  LT quad VMS x 10 mins 10 sec on/off with SAQs                  PT Long Term Goals - 12/10/17 2114      PT LONG TERM GOAL #1   Title  Patient will be independent with HEP and it's progression.    Time  6    Period  Weeks    Status  New      PT LONG TERM GOAL #2   Title  Patient will demonstrate left knee AROM to 0-95 or greater to improve gait and stair ambulation.    Time  6    Period  Weeks    Status  New  PT LONG TERM GOAL #3   Title  Patient will report ability to walk for 15 minutes with least restrictive AD and pain less than 5/10 in left LE.    Time  6    Period  Weeks    Status  New      PT LONG TERM GOAL #4   Title  Patient will improve L LE strength to 3+/5 or greater to improve stability during functional tasks.    Time  6    Status  New            Plan - 12/11/17 1336    Clinical Impression Statement  Pt arrived today using a walking stick for ambulation, but he reports LT knee given out again. Rx focused on CKC and [facility] for LT quad control. He needs constant supervision during standing acts.  Pt did well with VMS as well with muscle activation while performing SAQs. Continue with skilled PT    Clinical Presentation  Evolving    Clinical Decision Making  Moderate    Rehab Potential  Fair    PT Frequency  2x / week    PT Duration  6 weeks    PT Treatment/Interventions  ADLs/Self Care Home Management;Gait training;Stair training;Neuromuscular re-education;Passive range of motion;Manual techniques;Moist Heat;Cryotherapy;Tax inspector;Therapeutic exercise;Therapeutic activities;Functional mobility training;Ultrasound;Patient/family education    PT Next Visit Plan  review HEP and  precautions, Nustep, LE strengthening, modalities PRN for pain relief.    PT Home Exercise Plan  see patient education section    Consulted and Agree with Plan of Care  Patient       Patient will benefit from skilled therapeutic intervention in order to improve the following deficits and impairments:  Abnormal gait, Pain, Decreased balance, Decreased knowledge of precautions, Difficulty walking, Decreased strength, Decreased range of motion, Postural dysfunction, Decreased endurance, Decreased activity tolerance, Decreased safety awareness  Visit Diagnosis: Pain in left leg  Muscle weakness (generalized)  Difficulty in walking, not elsewhere classified     Problem List Patient Active Problem List   Diagnosis Date Noted  . Chronic hand pain, right 03/15/2016  . Carpal tunnel syndrome 09/22/2013  . Hypertension 07/19/2012  . Hyperlipidemia 07/19/2012  . Seizures ([place]) 07/19/2012    RAMSEUR,[name], PTA 12/11/2017, 2:17 PM  [facility] [place], [place], [address] Phone: [phone]   Fax:  [phone]  Name: Christopher Salas MRN: [id number] Date of Birth: 1965-08-28

## 2017-12-18 ENCOUNTER — Ambulatory Visit: Payer: BLUE CROSS/BLUE SHIELD | Attending: Physician Assistant | Admitting: *Deleted

## 2017-12-18 DIAGNOSIS — M79605 Pain in left leg: Secondary | ICD-10-CM | POA: Diagnosis not present

## 2017-12-18 DIAGNOSIS — R262 Difficulty in walking, not elsewhere classified: Secondary | ICD-10-CM | POA: Diagnosis not present

## 2017-12-18 DIAGNOSIS — M6281 Muscle weakness (generalized): Secondary | ICD-10-CM | POA: Insufficient documentation

## 2017-12-18 NOTE — Therapy (Signed)
[facility] [place], [place], [address] Phone: [phone]   Fax:  [phone]  Physical Therapy Treatment  Patient Details  Name: Christopher Salas MRN: [id number] Date of Birth: 1965/06/21 Referring Provider (PT): Ferne Reus, [place]   Encounter Date: 12/18/2017  PT End of Session - 12/18/17 1002    Visit Number  3    Number of Visits  12    Date for PT Re-Evaluation  01/21/18    Authorization Type  Progress note every 10th visit             To MD 01-21-18    PT Start Time  0950    PT Stop Time  1039    PT Time Calculation (min)  49 min       Past Medical History:  Diagnosis Date  . Hyperlipidemia   . Hypertension   . Seizures ([place])     Past Surgical History:  Procedure Laterality Date  . HAND SURGERY      There were no vitals filed for this visit.  Subjective Assessment - 12/18/17 1000    Subjective  Had increased tightness and aching after last Rx.      Patient is accompained by:  Family member    Pertinent History  HTN    Limitations  Standing;Walking;House hold activities    How long can you stand comfortably?  5 minutes    How long can you walk comfortably?  10 minutes with walking stick    Diagnostic tests  MRI & X-Ray prior to surgery    Patient Stated Goals  decrease pain, stop falling    Currently in Pain?  Yes    Pain Score  6     Pain Location  Leg    Pain Orientation  Left    Pain Descriptors / Indicators  Burning;Shooting    Pain Type  Surgical pain    Pain Onset  More than a month ago                       [facility] Adult PT Treatment/Exercise - 12/18/17 0001      Exercises   Exercises  Lumbar;Knee/Hip      Lumbar Exercises: Aerobic   Nustep  L5 x 15 mins  UE and LE activity seat 12      Knee/Hip Exercises: Standing   Terminal Knee Extension  --   constant V/Cs to keep Pt focused on task   Lateral Step Up  Left;2 sets;10 reps;Step Height: 6";Hand Hold: 2   toe taps  only.   Forward Step Up  Left;2 sets;10 reps;Hand Hold: 2;Step Height: 6"   challenging.  SBA/CGA   Rocker Board  5 minutes   PF/DF and calf stretching, balance CGA     Knee/Hip Exercises: Supine   Short Arc Quad Sets  AROM;Left   x 10 mins during VMS     Modalities   Modalities  Electrical Stimulation      Electrical Stimulation   Electrical Stimulation Location  LT quad VMS x 10 mins 10 sec on/off with SAQs                  PT Long Term Goals - 12/10/17 2114      PT LONG TERM GOAL #1   Title  Patient will be independent with HEP and it's progression.    Time  6    Period  Weeks    Status  New  PT LONG TERM GOAL #2   Title  Patient will demonstrate left knee AROM to 0-95 or greater to improve gait and stair ambulation.    Time  6    Period  Weeks    Status  New      PT LONG TERM GOAL #3   Title  Patient will report ability to walk for 15 minutes with least restrictive AD and pain less than 5/10 in left LE.    Time  6    Period  Weeks    Status  New      PT LONG TERM GOAL #4   Title  Patient will improve L LE strength to 3+/5 or greater to improve stability during functional tasks.    Time  6    Status  New            Plan - 12/18/17 1154    Clinical Impression Statement  Pt arrived today doing about the same with pain into LT LE and weakness of LT LE. He is still using walking stick on LT side for ambulation at this time. Rx focused on LT LE strengthening and control with CKC, OKC as well as VMS to quads. He had good activation of quads again but reports he can barely feel it.. Continue with skillled PT.    Rehab Potential  Fair    PT Frequency  2x / week    PT Duration  6 weeks    PT Treatment/Interventions  ADLs/Self Care Home Management;Gait training;Stair training;Neuromuscular re-education;Passive range of motion;Manual techniques;Moist Heat;Cryotherapy;Tax inspector;Therapeutic  exercise;Therapeutic activities;Functional mobility training;Ultrasound;Patient/family education    PT Next Visit Plan  review HEP and precautions, Nustep, LE strengthening, modalities PRN for pain relief.    PT Home Exercise Plan  see patient education section    Consulted and Agree with Plan of Care  Patient       Patient will benefit from skilled therapeutic intervention in order to improve the following deficits and impairments:  Abnormal gait, Pain, Decreased balance, Decreased knowledge of precautions, Difficulty walking, Decreased strength, Decreased range of motion, Postural dysfunction, Decreased endurance, Decreased activity tolerance, Decreased safety awareness  Visit Diagnosis: Pain in left leg  Muscle weakness (generalized)  Difficulty in walking, not elsewhere classified     Problem List Patient Active Problem List   Diagnosis Date Noted  . Chronic hand pain, right 03/15/2016  . Carpal tunnel syndrome 09/22/2013  . Hypertension 07/19/2012  . Hyperlipidemia 07/19/2012  . Seizures ([place]) 07/19/2012    RAMSEUR,[name], PTA 12/18/2017, 12:08 PM  [facility] [place], [place], [address] Phone: [phone]   Fax:  [phone]  Name: Christopher Salas MRN: [id number] Date of Birth: 1965/07/15

## 2017-12-19 ENCOUNTER — Ambulatory Visit: Payer: BLUE CROSS/BLUE SHIELD | Admitting: Physical Therapy

## 2017-12-19 ENCOUNTER — Encounter: Payer: Self-pay | Admitting: Physical Therapy

## 2017-12-19 DIAGNOSIS — M79605 Pain in left leg: Secondary | ICD-10-CM | POA: Diagnosis not present

## 2017-12-19 DIAGNOSIS — M6281 Muscle weakness (generalized): Secondary | ICD-10-CM

## 2017-12-19 DIAGNOSIS — R262 Difficulty in walking, not elsewhere classified: Secondary | ICD-10-CM

## 2017-12-19 NOTE — Therapy (Signed)
[facility] [place], [place], [address] Phone: [phone]   Fax:  [phone]  Physical Therapy Treatment  Patient Details  Name: Christopher Salas MRN: [id number] Date of Birth: 16-Aug-1965 Referring Provider (PT): Ferne Reus, [place]   Encounter Date: 12/19/2017  PT End of Session - 12/19/17 0734    Visit Number  4    Number of Visits  12    Date for PT Re-Evaluation  01/21/18    Authorization Type  Progress note every 10th visit             To MD 01-21-18    PT Start Time  0732    PT Stop Time  0818    PT Time Calculation (min)  46 min    Activity Tolerance  Patient tolerated treatment well    Behavior During Therapy  [facility] for tasks assessed/performed       Past Medical History:  Diagnosis Date  . Hyperlipidemia   . Hypertension   . Seizures ([place])     Past Surgical History:  Procedure Laterality Date  . HAND SURGERY      There were no vitals filed for this visit.  Subjective Assessment - 12/19/17 0734    Subjective  Reports some pain this morning in his LE. Patient continues to report lack of support in LLE and inability to detect LLE at times. Numbness along quad and groin still present per patient report.    Patient is accompained by:  Family member   Friend   Pertinent History  HTN    Limitations  Standing;Walking;House hold activities    How long can you stand comfortably?  5 minutes    How long can you walk comfortably?  10 minutes with walking stick    Diagnostic tests  MRI & X-Ray prior to surgery    Patient Stated Goals  decrease pain, stop falling    Currently in Pain?  Yes    Pain Score  6     Pain Location  Leg    Pain Orientation  Left    Pain Descriptors / Indicators  Burning;Aching    Pain Type  Surgical pain    Pain Onset  More than a month ago         [facility] PT Assessment - 12/19/17 0001      Assessment   Medical Diagnosis  Left leg weakness    Onset Date/Surgical Date  10/30/17     Next MD Visit  01/21/18    Prior Therapy  no      Precautions   Precautions  Back    Precaution Comments  No lifting or bending      Restrictions   Weight Bearing Restrictions  No                   [facility] Adult PT Treatment/Exercise - 12/19/17 0001      Exercises   Exercises  Lumbar;Knee/Hip      Lumbar Exercises: Aerobic   Nustep  L6 x15 min      Knee/Hip Exercises: Standing   Heel Raises  Both;15 reps   L knee block via PTA required to avoid knee flexion   Heel Raises Limitations  B toe raise x15 reps    Terminal Knee Extension  Strengthening;Left;3 sets;10 reps;Theraband    Theraband Level (Terminal Knee Extension)  Level 2 (Red)    Forward Step Up  Left;15 reps;Hand Hold: 2;Step Height: 6Armed forces technical officer  3 minutes      Knee/Hip Exercises: Supine   Short Arc Quad Sets  Left;Other (comment)   Russian stim for neuro re-ed     Modalities   Modalities  Teacher, English as a foreign language Location  L VMO/Quad     Education administrator Parameters  10/10, 50 pps, 50%, 5 sec ramp x15 min    Electrical Stimulation Goals  Neuromuscular facilitation                  PT Long Term Goals - 12/19/17 4174      PT LONG TERM GOAL #1   Title  Patient will be independent with HEP and it's progression.    Time  6    Period  Weeks    Status  On-going      PT LONG TERM GOAL #2   Title  Patient will demonstrate left knee AROM to 0-95 or greater to improve gait and stair ambulation.    Time  6    Period  Weeks    Status  On-going      PT LONG TERM GOAL #3   Title  Patient will report ability to walk for 15 minutes with least restrictive AD and pain less than 5/10 in left LE.    Time  6    Period  Weeks    Status  On-going   7-8/10 with fatigue experienced as well 12/19/2017     PT LONG TERM GOAL #4   Title  Patient will improve L LE strength to 3+/5 or greater to  improve stability during functional tasks.    Time  6    Status  On-going            Plan - 12/19/17 0814    Clinical Impression Statement  Patient tolerated today's treatment fairly well although patient still unable to control LLE and weakness of L quad present as well. Patient still requires walking stick to assist with ambulation. LLE focused strengthening exercises completed with lack of muscle control noted. L knee flexion noted with heel raises and tactile cueing required to block L knee. Minimal toe raise able to be completed in standing. L hip ER noted during Nustep warm up. Good response to L quad/VMO in russian setting to level 87. Patient still requires friend's assistance in timing as patient reports inability to feel sensation. Patient able to detect taps in L calf by friend during stimulation session. Goals remain on-going at this time secondary to LLE weakness, deficient L knee ROM and pain during ambulation.    Rehab Potential  Fair    PT Frequency  2x / week    PT Duration  6 weeks    PT Treatment/Interventions  ADLs/Self Care Home Management;Gait training;Stair training;Neuromuscular re-education;Passive range of motion;Manual techniques;Moist Heat;Cryotherapy;Tax inspector;Therapeutic exercise;Therapeutic activities;Functional mobility training;Ultrasound;Patient/family education    PT Next Visit Plan  review HEP and precautions, Nustep, LE strengthening, modalities PRN for pain relief.    PT Home Exercise Plan  see patient education section    Consulted and Agree with Plan of Care  Patient       Patient will benefit from skilled therapeutic intervention in order to improve the following deficits and impairments:  Abnormal gait, Pain, Decreased balance, Decreased knowledge of precautions, Difficulty walking, Decreased strength, Decreased range of motion, Postural dysfunction, Decreased endurance, Decreased activity tolerance,  Decreased safety awareness  Visit Diagnosis: Pain in left leg  Muscle weakness (generalized)  Difficulty in walking, not elsewhere classified     Problem List Patient Active Problem List   Diagnosis Date Noted  . Chronic hand pain, right 03/15/2016  . Carpal tunnel syndrome 09/22/2013  . Hypertension 07/19/2012  . Hyperlipidemia 07/19/2012  . Seizures ([place]) 07/19/2012    Standley Brooking, PTA 12/19/2017, 8:42 AM  [facility] [address] [place], [place], [address] Phone: [phone]   Fax:  [phone]  Name: Christopher Salas MRN: [id number] Date of Birth: [birth year]

## 2017-12-23 ENCOUNTER — Other Ambulatory Visit: Payer: Self-pay | Admitting: Family Medicine

## 2017-12-23 DIAGNOSIS — E785 Hyperlipidemia, unspecified: Secondary | ICD-10-CM

## 2017-12-24 ENCOUNTER — Ambulatory Visit: Payer: BLUE CROSS/BLUE SHIELD | Admitting: Physical Therapy

## 2017-12-24 ENCOUNTER — Encounter: Payer: Self-pay | Admitting: Physical Therapy

## 2017-12-24 DIAGNOSIS — R262 Difficulty in walking, not elsewhere classified: Secondary | ICD-10-CM | POA: Diagnosis not present

## 2017-12-24 DIAGNOSIS — M79605 Pain in left leg: Secondary | ICD-10-CM

## 2017-12-24 DIAGNOSIS — M6281 Muscle weakness (generalized): Secondary | ICD-10-CM | POA: Diagnosis not present

## 2017-12-24 NOTE — Therapy (Signed)
[facility] [place], [place], [address] Phone: [phone]   Fax:  [phone]  Physical Therapy Treatment  Patient Details  Name: Christopher Salas MRN: [id number] Date of Birth: 01-03-1966 Referring Provider (PT): Ferne Reus, [place]   Encounter Date: 12/24/2017  PT End of Session - 12/24/17 1423    Visit Number  5    Number of Visits  12    Date for PT Re-Evaluation  01/21/18    Authorization Type  Progress note every 10th visit             To MD 01-21-18    PT Start Time  1344    PT Stop Time  1429    PT Time Calculation (min)  45 min    Activity Tolerance  Patient tolerated treatment well    Behavior During Therapy  [facility] for tasks assessed/performed       Past Medical History:  Diagnosis Date  . Hyperlipidemia   . Hypertension   . Seizures ([place])     Past Surgical History:  Procedure Laterality Date  . HAND SURGERY      There were no vitals filed for this visit.  Subjective Assessment - 12/24/17 1349    Subjective  Patient reported doing well after last treatment yet ongoing discomfort    Patient is accompained by:  Family member   friend   Pertinent History  HTN    Limitations  Standing;Walking;House hold activities    How long can you stand comfortably?  5 minutes    How long can you walk comfortably?  10 minutes with walking stick    Diagnostic tests  MRI & X-Ray prior to surgery    Patient Stated Goals  decrease pain, stop falling    Currently in Pain?  Yes    Pain Score  7     Pain Location  Leg    Pain Orientation  Left    Pain Descriptors / Indicators  Burning;Aching;Discomfort    Pain Type  Surgical pain    Pain Onset  More than a month ago    Pain Frequency  Intermittent    Aggravating Factors   bending knee up or walking    Pain Relieving Factors  pain medication and ice         [facility] PT Assessment - 12/24/17 0001      AROM   Right/Left Knee  Left    Left Knee Extension  0    Left  Knee Flexion  89                   [facility] Adult PT Treatment/Exercise - 12/24/17 0001      Exercises   Exercises  Lumbar;Knee/Hip      Lumbar Exercises: Aerobic   Nustep  L6 x10 min UE/LE activity, monitored      Lumbar Exercises: Supine   Ab Set  20 reps;5 seconds    Glut Set  20 reps;5 seconds      Knee/Hip Exercises: Seated   Long Arc Quad  Strengthening;Left;1 set;5 reps;Limitations    Long Arc Quad Limitations  half range only      Knee/Hip Exercises: Supine   Short Arc Quad Sets  Strengthening;Left;20 reps    Short Arc Quad Sets Limitations  with ball for add squeeze   required muscle tapping    Other Supine Knee/Hip Exercises  heel digs on ball Education officer, community  Stimulation Location  L VMO/Quad     Chartered certified accountant  [place] with SAQ for activation    Electrical Stimulation Parameters  10/10 50pps 50% 5sec ramp x24min    Electrical Stimulation Goals  Neuromuscular facilitation      Manual Therapy   Manual Therapy  Passive ROM;Other (comment)    Passive ROM  genle PROM for left knee flexion     Other Manual Therapy  manual isometrics for hip abd, hip add while performing muscle tapping to facilitate muscle                   PT Long Term Goals - 12/24/17 1424      PT LONG TERM GOAL #1   Title  Patient will be independent with HEP and it's progression.    Time  6    Period  Weeks    Status  On-going      PT LONG TERM GOAL #2   Title  Patient will demonstrate left knee AROM to 0-95 or greater to improve gait and stair ambulation.    Time  6    Period  Weeks    Status  On-going   0-89 degrees 12/24/17     PT LONG TERM GOAL #3   Title  Patient will report ability to walk for 15 minutes with least restrictive AD and pain less than 5/10 in left LE.    Time  6    Period  Weeks    Status  On-going      PT LONG TERM GOAL #4   Title  Patient will improve L LE strength to 3+/5 or greater to improve  stability during functional tasks.    Time  6    Status  On-going            Plan - 12/24/17 1424    Clinical Impression Statement  Patient tolerated treatment well today. Patient has ongoing reported pain that is relieved with medication. Patient has weakness in right LE muscles and required ongoing educational cues to technique as well as muscle tapping to facilitate muscles. Patient had good quad activation with [place] ES with SAQ yet required cues throughout to lift when machine is on. Goals ongoing at this time yet progressing.    PT Frequency  2x / week    PT Duration  6 weeks    PT Treatment/Interventions  ADLs/Self Care Home Management;Gait training;Stair training;Neuromuscular re-education;Passive range of motion;Manual techniques;Moist Heat;Cryotherapy;Tax inspector;Therapeutic exercise;Therapeutic activities;Functional mobility training;Ultrasound;Patient/family education    PT Next Visit Plan  cont with POC for Nustep, LE strengthening, modalities PRN for pain relief.    Consulted and Agree with Plan of Care  Patient       Patient will benefit from skilled therapeutic intervention in order to improve the following deficits and impairments:  Abnormal gait, Pain, Decreased balance, Decreased knowledge of precautions, Difficulty walking, Decreased strength, Decreased range of motion, Postural dysfunction, Decreased endurance, Decreased activity tolerance, Decreased safety awareness  Visit Diagnosis: Pain in left leg  Muscle weakness (generalized)  Difficulty in walking, not elsewhere classified     Problem List Patient Active Problem List   Diagnosis Date Noted  . Chronic hand pain, right 03/15/2016  . Carpal tunnel syndrome 09/22/2013  . Hypertension 07/19/2012  . Hyperlipidemia 07/19/2012  . Seizures ([place]) 07/19/2012    Jules Vidovich P, PTA 12/24/2017, 2:32 PM  [facility] [address] [place], [place], [address] Phone: [phone]  Fax:  [phone]  Name: Christopher Salas MRN: [id number] Date of Birth: 1966/01/15

## 2017-12-25 ENCOUNTER — Encounter: Payer: Self-pay | Admitting: Physical Therapy

## 2017-12-25 ENCOUNTER — Ambulatory Visit: Payer: BLUE CROSS/BLUE SHIELD | Admitting: Physical Therapy

## 2017-12-25 DIAGNOSIS — M79605 Pain in left leg: Secondary | ICD-10-CM

## 2017-12-25 DIAGNOSIS — M6281 Muscle weakness (generalized): Secondary | ICD-10-CM | POA: Diagnosis not present

## 2017-12-25 DIAGNOSIS — R262 Difficulty in walking, not elsewhere classified: Secondary | ICD-10-CM | POA: Diagnosis not present

## 2017-12-25 NOTE — Therapy (Signed)
[facility] [place], [place], [address] Phone: [phone]   Fax:  [phone]  Physical Therapy Treatment  Patient Details  Name: Christopher Salas MRN: [id number] Date of Birth: May 28, 1965 Referring Provider (PT): Ferne Reus, [place]   Encounter Date: 12/25/2017  PT End of Session - 12/25/17 7169    Visit Number  6    Number of Visits  12    Date for PT Re-Evaluation  01/21/18    Authorization Type  Progress note every 10th visit             To MD 01-21-18    PT Start Time  0819    PT Stop Time  0901    PT Time Calculation (min)  42 min    Activity Tolerance  Patient tolerated treatment well    Behavior During Therapy  [facility] for tasks assessed/performed       Past Medical History:  Diagnosis Date  . Hyperlipidemia   . Hypertension   . Seizures ([place])     Past Surgical History:  Procedure Laterality Date  . HAND SURGERY      There were no vitals filed for this visit.  Subjective Assessment - 12/25/17 0819    Subjective  Reports doing okay.    Patient is accompained by:  Family member   Friend   Limitations  Standing;Walking;House hold activities    How long can you stand comfortably?  5 minutes    How long can you walk comfortably?  10 minutes with walking stick    Diagnostic tests  MRI & X-Ray prior to surgery    Patient Stated Goals  decrease pain, stop falling    Currently in Pain?  Yes    Pain Score  6     Pain Location  Leg    Pain Orientation  Left    Pain Descriptors / Indicators  Burning;Aching    Pain Type  Surgical pain    Pain Onset  More than a month ago    Pain Frequency  Intermittent         [facility] PT Assessment - 12/25/17 0001      Assessment   Medical Diagnosis  Left leg weakness    Onset Date/Surgical Date  10/30/17    Next MD Visit  01/21/18    Prior Therapy  no      Precautions   Precautions  Back    Precaution Comments  No lifting or bending      Restrictions   Weight  Bearing Restrictions  No                   [facility] Adult PT Treatment/Exercise - 12/25/17 0001      Exercises   Exercises  Lumbar;Knee/Hip      Lumbar Exercises: Aerobic   Nustep  L6 x11 min UE/LE activity, monitored      Lumbar Exercises: Supine   Ab Set  15 reps;5 seconds    Glut Set  15 reps;5 seconds    Bent Knee Raise  15 reps;2 seconds      Knee/Hip Exercises: Supine   Hip Adduction Isometric  Strengthening;Both;2 sets;10 reps    Other Supine Knee/Hip Exercises  L HS isometric x15 reps      Modalities   Modalities  Teacher, English as a foreign language Location  L VMO/Quad     Chartered certified accountant  Russian with SAQ  Electrical Stimulation Parameters  10/10, 50 pps, 5 sec ramp, 50% x15 min    Electrical Stimulation Goals  Neuromuscular facilitation             PT Education - 12/25/17 0848    Education Details  HEP- SAQ, pelvic tilt, glute set, ball squeeze    Person(s) Educated  Patient;Other (comment)   Friend   Methods  Explanation;Handout    Comprehension  Verbalized understanding          PT Long Term Goals - 12/24/17 1424      PT LONG TERM GOAL #1   Title  Patient will be independent with HEP and it's progression.    Time  6    Period  Weeks    Status  On-going      PT LONG TERM GOAL #2   Title  Patient will demonstrate left knee AROM to 0-95 or greater to improve gait and stair ambulation.    Time  6    Period  Weeks    Status  On-going   0-89 degrees 12/24/17     PT LONG TERM GOAL #3   Title  Patient will report ability to walk for 15 minutes with least restrictive AD and pain less than 5/10 in left LE.    Time  6    Period  Weeks    Status  On-going      PT LONG TERM GOAL #4   Title  Patient will improve L LE strength to 3+/5 or greater to improve stability during functional tasks.    Time  6    Status  On-going            Plan - 12/25/17 0855    Clinical  Impression Statement  Patient presented in clinic with continued 6/10 LLE pain. Patient still deficient in LLE sensation especially along medial and lateral L hip and thigh. Increased intensity of burning reported by patient with hip adductor squeeze. Muscle tapping utilized during [place] stimulation for neuro re-ed to assist in fascilitating musculature. Improved muscle activation noted with russian stimulation and overall decreased output required to ellicit contraction at 58. Normal stimulation response noted for russian stimulation. Patient provided new HEP to target defcient musculature with VCing to stop if any increased pain occur and to continue at home daily to progress.    Rehab Potential  Fair    PT Frequency  2x / week    PT Duration  6 weeks    PT Treatment/Interventions  ADLs/Self Care Home Management;Gait training;Stair training;Neuromuscular re-education;Passive range of motion;Manual techniques;Moist Heat;Cryotherapy;Tax inspector;Therapeutic exercise;Therapeutic activities;Functional mobility training;Ultrasound;Patient/family education    PT Next Visit Plan  cont with POC for Nustep, LE strengthening, modalities PRN for pain relief.    PT Home Exercise Plan  see patient education section    Consulted and Agree with Plan of Care  Patient;Family member/caregiver    Family Member Consulted  Friend       Patient will benefit from skilled therapeutic intervention in order to improve the following deficits and impairments:  Abnormal gait, Pain, Decreased balance, Decreased knowledge of precautions, Difficulty walking, Decreased strength, Decreased range of motion, Postural dysfunction, Decreased endurance, Decreased activity tolerance, Decreased safety awareness  Visit Diagnosis: Pain in left leg  Muscle weakness (generalized)  Difficulty in walking, not elsewhere classified     Problem List Patient Active Problem List   Diagnosis  Date Noted  . Chronic hand pain, right 03/15/2016  . Carpal tunnel syndrome  09/22/2013  . Hypertension 07/19/2012  . Hyperlipidemia 07/19/2012  . Seizures ([place]) 07/19/2012    Standley Brooking, PTA 12/25/2017, 9:18 AM  [facility] [address] [place], [place], [address] Phone: [phone]   Fax:  [phone]  Name: JAWUAN ROBB MRN: [id number] Date of Birth: March 08, 1966

## 2017-12-31 ENCOUNTER — Ambulatory Visit: Payer: BLUE CROSS/BLUE SHIELD | Admitting: Physical Therapy

## 2017-12-31 ENCOUNTER — Encounter: Payer: Self-pay | Admitting: Physical Therapy

## 2017-12-31 DIAGNOSIS — M79605 Pain in left leg: Secondary | ICD-10-CM

## 2017-12-31 DIAGNOSIS — R262 Difficulty in walking, not elsewhere classified: Secondary | ICD-10-CM | POA: Diagnosis not present

## 2017-12-31 DIAGNOSIS — M6281 Muscle weakness (generalized): Secondary | ICD-10-CM | POA: Diagnosis not present

## 2017-12-31 NOTE — Therapy (Signed)
[facility] [place], [place], [address] Phone: [phone]   Fax:  [phone]  Physical Therapy Treatment  Patient Details  Name: Christopher Salas MRN: [id number] Date of Birth: 1965/09/26 Referring Provider (PT): Ferne Reus, [place]   Encounter Date: 12/31/2017  PT End of Session - 12/31/17 0910    Visit Number  7    Number of Visits  12    Date for PT Re-Evaluation  01/21/18    Authorization Type  Progress note every 10th visit             To MD 01-21-18    PT Start Time  0908    PT Stop Time  0950    PT Time Calculation (min)  42 min    Activity Tolerance  Patient tolerated treatment well    Behavior During Therapy  [facility] for tasks assessed/performed       Past Medical History:  Diagnosis Date  . Hyperlipidemia   . Hypertension   . Seizures ([place])     Past Surgical History:  Procedure Laterality Date  . HAND SURGERY      There were no vitals filed for this visit.  Subjective Assessment - 12/31/17 0909    Subjective  Reports that he walked a lot more on Saturday and didn't do much yesterday at all.    Patient is accompained by:  Family member   Friend   Pertinent History  HTN    Limitations  Standing;Walking;House hold activities    How long can you stand comfortably?  5 minutes    How long can you walk comfortably?  10 minutes with walking stick    Diagnostic tests  MRI & X-Ray prior to surgery    Patient Stated Goals  decrease pain, stop falling    Currently in Pain?  Other (Comment)   No pain assessment provided by patient        [facility] PT Assessment - 12/31/17 0001      Assessment   Medical Diagnosis  Left leg weakness    Onset Date/Surgical Date  10/30/17    Next MD Visit  01/21/18    Prior Therapy  no      Precautions   Precautions  Back    Precaution Comments  No lifting or bending      Restrictions   Weight Bearing Restrictions  No                   [facility] Adult PT  Treatment/Exercise - 12/31/17 0001      Lumbar Exercises: Aerobic   Nustep  L5 x15 min      Knee/Hip Exercises: Standing   Terminal Knee Extension  Strengthening;Left;2 sets;10 reps;Theraband    Theraband Level (Terminal Knee Extension)  Level 3 (Green)    Forward Step Up  Left;10 reps;Hand Hold: 2;Step Height: 6"      Knee/Hip Exercises: Seated   Long Arc Quad  Strengthening;Left;2 sets;10 reps;Limitations    Long Arc Quad Limitations  Russian to L knee for neuro re-ed      Aeronautical engineer Location  L VMO/Quad     Chartered certified accountant  Russian with LAQ    Electrical Stimulation Parameters  10/10, 50 pps, 5 sec ramp, 50% x15 min    Electrical Stimulation Goals  Neuromuscular facilitation  PT Long Term Goals - 12/24/17 1424      PT LONG TERM GOAL #1   Title  Patient will be independent with HEP and it's progression.    Time  6    Period  Weeks    Status  On-going      PT LONG TERM GOAL #2   Title  Patient will demonstrate left knee AROM to 0-95 or greater to improve gait and stair ambulation.    Time  6    Period  Weeks    Status  On-going   0-89 degrees 12/24/17     PT LONG TERM GOAL #3   Title  Patient will report ability to walk for 15 minutes with least restrictive AD and pain less than 5/10 in left LE.    Time  6    Period  Weeks    Status  On-going      PT LONG TERM GOAL #4   Title  Patient will improve L LE strength to 3+/5 or greater to improve stability during functional tasks.    Time  6    Status  On-going            Plan - 12/31/17 1025    Clinical Impression Statement  Patient tolerated today's treatment fairly well although he reported fatigue from walking around a local festival. L patella mobility noted with quad contraction and forward step ups. Patient required tactile blocking in posterior L knee during step ups secondary  to knee hyperextension. Patient able to complete 50-60% range of LAQ today upon comparison of RLE. [place] stimulation completed in sitting with LAQ with vastus medialis and hip adductor muscle twitching but patient still unable to detect stimulation independently. Normal modality response noted following removal of the modality.     Rehab Potential  Fair    PT Frequency  2x / week    PT Duration  6 weeks    PT Treatment/Interventions  ADLs/Self Care Home Management;Gait training;Stair training;Neuromuscular re-education;Passive range of motion;Manual techniques;Moist Heat;Cryotherapy;Tax inspector;Therapeutic exercise;Therapeutic activities;Functional mobility training;Ultrasound;Patient/family education    PT Next Visit Plan  cont with POC for Nustep, LE strengthening, modalities PRN for pain relief.    PT Home Exercise Plan  see patient education section    Consulted and Agree with Plan of Care  Patient;Family member/caregiver    Family Member Consulted  Friend       Patient will benefit from skilled therapeutic intervention in order to improve the following deficits and impairments:  Abnormal gait, Pain, Decreased balance, Decreased knowledge of precautions, Difficulty walking, Decreased strength, Decreased range of motion, Postural dysfunction, Decreased endurance, Decreased activity tolerance, Decreased safety awareness  Visit Diagnosis: Pain in left leg  Muscle weakness (generalized)  Difficulty in walking, not elsewhere classified     Problem List Patient Active Problem List   Diagnosis Date Noted  . Chronic hand pain, right 03/15/2016  . Carpal tunnel syndrome 09/22/2013  . Hypertension 07/19/2012  . Hyperlipidemia 07/19/2012  . Seizures ([place]) 07/19/2012    Standley Brooking, PTA 12/31/2017, 11:05 AM  [facility] [address] [place], [place], [address] Phone: [phone]   Fax:   [phone]  Name: NICCOLO BURGGRAF MRN: [id number] Date of Birth: 03/03/1966

## 2018-01-01 ENCOUNTER — Encounter: Payer: Self-pay | Admitting: Physical Therapy

## 2018-01-01 ENCOUNTER — Ambulatory Visit: Payer: BLUE CROSS/BLUE SHIELD | Admitting: Physical Therapy

## 2018-01-01 DIAGNOSIS — M79605 Pain in left leg: Secondary | ICD-10-CM

## 2018-01-01 DIAGNOSIS — M6281 Muscle weakness (generalized): Secondary | ICD-10-CM | POA: Diagnosis not present

## 2018-01-01 DIAGNOSIS — R262 Difficulty in walking, not elsewhere classified: Secondary | ICD-10-CM

## 2018-01-01 NOTE — Therapy (Signed)
[facility] [place], [place], [address] Phone: [phone]   Fax:  [phone]  Physical Therapy Treatment  Patient Details  Name: Christopher Salas MRN: [id number] Date of Birth: 03-01-1966 Referring Provider (PT): Ferne Reus, [place]   Encounter Date: 01/01/2018  PT End of Session - 01/01/18 0912    Visit Number  8    Number of Visits  12    Date for PT Re-Evaluation  01/21/18    Authorization Type  Progress note every 10th visit             To MD 01-21-18    PT Start Time  0907    PT Stop Time  0952    PT Time Calculation (min)  45 min    Activity Tolerance  Patient tolerated treatment well    Behavior During Therapy  [facility] for tasks assessed/performed       Past Medical History:  Diagnosis Date  . Hyperlipidemia   . Hypertension   . Seizures ([place])     Past Surgical History:  Procedure Laterality Date  . HAND SURGERY      There were no vitals filed for this visit.  Subjective Assessment - 01/01/18 0912    Subjective  Reports he isn't hurting as bad today.    Patient is accompained by:  Family member   Friend   Pertinent History  HTN    Limitations  Standing;Walking;House hold activities    How long can you stand comfortably?  5 minutes    How long can you walk comfortably?  10 minutes with walking stick    Diagnostic tests  MRI & X-Ray prior to surgery    Patient Stated Goals  decrease pain, stop falling    Currently in Pain?  Yes    Pain Score  5     Pain Location  Leg    Pain Orientation  Left    Pain Descriptors / Indicators  Discomfort    Pain Type  Surgical pain    Pain Onset  More than a month ago         [facility] PT Assessment - 01/01/18 0001      Assessment   Medical Diagnosis  Left leg weakness    Onset Date/Surgical Date  10/30/17    Next MD Visit  01/21/18    Prior Therapy  no      Precautions   Precautions  Back    Precaution Comments  No lifting or bending      Restrictions    Weight Bearing Restrictions  No                   [facility] Adult PT Treatment/Exercise - 01/01/18 0001      Lumbar Exercises: Aerobic   Nustep  L5 x16 min      Knee/Hip Exercises: Standing   Terminal Knee Extension  Strengthening;Left;2 sets;10 reps;Theraband    Theraband Level (Terminal Knee Extension)  Level 3 (Green)    Forward Step Up  Left;15 reps;Hand Hold: 2;Step Height: 6"      Knee/Hip Exercises: Seated   Long Arc Quad  Left;Limitations    Long Arc Quad Limitations  [place] to L knee for neuro re-ed      Aeronautical engineer Location  L VMO/Quad     Chartered certified accountant  Russian with Scientist, forensic Parameters  10/10, 50 pps, 5 sec ramp, 50% x15 min    Electrical Stimulation Goals  Neuromuscular facilitation                  PT Long Term Goals - 12/24/17 1424      PT LONG TERM GOAL #1   Title  Patient will be independent with HEP and it's progression.    Time  6    Period  Weeks    Status  On-going      PT LONG TERM GOAL #2   Title  Patient will demonstrate left knee AROM to 0-95 or greater to improve gait and stair ambulation.    Time  6    Period  Weeks    Status  On-going   0-89 degrees 12/24/17     PT LONG TERM GOAL #3   Title  Patient will report ability to walk for 15 minutes with least restrictive AD and pain less than 5/10 in left LE.    Time  6    Period  Weeks    Status  On-going      PT LONG TERM GOAL #4   Title  Patient will improve L LE strength to 3+/5 or greater to improve stability during functional tasks.    Time  6    Status  On-going            Plan - 01/01/18 7121    Clinical Impression Statement  Patient tolerated today's treatment fairly well with less LLE pain. Patient's L patella still mobile with therex exercises and patient was educated about the patella mobility. Less overall tactile blocking of L  knee required during forward step ups. [place] stimulation continued with LAQ with patient able to better detect sitmualtion with placing his hand over electrodes. Normal modality response noted following removal of the modality.     Rehab Potential  Fair    PT Frequency  2x / week    PT Duration  6 weeks    PT Treatment/Interventions  ADLs/Self Care Home Management;Gait training;Stair training;Neuromuscular re-education;Passive range of motion;Manual techniques;Moist Heat;Cryotherapy;Tax inspector;Therapeutic exercise;Therapeutic activities;Functional mobility training;Ultrasound;Patient/family education    PT Next Visit Plan  cont with POC for Nustep, LE strengthening, modalities PRN for pain relief.    PT Home Exercise Plan  see patient education section    Consulted and Agree with Plan of Care  Patient;Family member/caregiver    Family Member Consulted  Friend       Patient will benefit from skilled therapeutic intervention in order to improve the following deficits and impairments:  Abnormal gait, Pain, Decreased balance, Decreased knowledge of precautions, Difficulty walking, Decreased strength, Decreased range of motion, Postural dysfunction, Decreased endurance, Decreased activity tolerance, Decreased safety awareness  Visit Diagnosis: Pain in left leg  Muscle weakness (generalized)  Difficulty in walking, not elsewhere classified     Problem List Patient Active Problem List   Diagnosis Date Noted  . Chronic hand pain, right 03/15/2016  . Carpal tunnel syndrome 09/22/2013  . Hypertension 07/19/2012  . Hyperlipidemia 07/19/2012  . Seizures ([place]) 07/19/2012    Standley Brooking, PTA 01/01/2018, 10:43 AM  [facility] [address] [place], [place], [address] Phone: [phone]   Fax:  [phone]  Name: Christopher Salas MRN: [id number] Date of Birth: Jul 18, 1965

## 2018-01-07 ENCOUNTER — Ambulatory Visit: Payer: BLUE CROSS/BLUE SHIELD | Admitting: *Deleted

## 2018-01-07 DIAGNOSIS — M6281 Muscle weakness (generalized): Secondary | ICD-10-CM | POA: Diagnosis not present

## 2018-01-07 DIAGNOSIS — M79605 Pain in left leg: Secondary | ICD-10-CM | POA: Diagnosis not present

## 2018-01-07 DIAGNOSIS — R262 Difficulty in walking, not elsewhere classified: Secondary | ICD-10-CM

## 2018-01-07 NOTE — Therapy (Signed)
[facility] [place], [place], [address] Phone: [phone]   Fax:  [phone]  Physical Therapy Treatment  Patient Details  Name: Christopher Salas MRN: [id number] Date of Birth: August 25, 1965 Referring Provider (PT): Ferne Reus, [place]   Encounter Date: 01/07/2018  PT End of Session - 01/07/18 1005    Visit Number  9    Number of Visits  12    Date for PT Re-Evaluation  01/21/18    Authorization Type  Progress note every 10th visit             To MD 01-21-18    PT Start Time  0949    PT Stop Time  1036    PT Time Calculation (min)  47 min       Past Medical History:  Diagnosis Date  . Hyperlipidemia   . Hypertension   . Seizures ([place])     Past Surgical History:  Procedure Laterality Date  . HAND SURGERY      There were no vitals filed for this visit.  Subjective Assessment - 01/07/18 1001    Patient is accompained by:  Family member    Pertinent History  HTN    Limitations  Standing;Walking;House hold activities    How long can you stand comfortably?  5 minutes    How long can you walk comfortably?  10 minutes with walking stick    Diagnostic tests  MRI & X-Ray prior to surgery    Patient Stated Goals  decrease pain, stop falling    Currently in Pain?  Yes    Pain Score  3     Pain Location  Leg    Pain Orientation  Left    Pain Descriptors / Indicators  Discomfort    Pain Type  Surgical pain    Pain Onset  More than a month ago    Pain Frequency  Intermittent                       [facility] Adult PT Treatment/Exercise - 01/07/18 0001      Lumbar Exercises: Aerobic   Nustep  L5 x16 min   (tried LEs only last 4 mins, but increased pain in LB)      Knee/Hip Exercises: Standing   Terminal Knee Extension  Strengthening;Left;10 reps;Theraband;3 sets;20 reps    Theraband Level (Terminal Knee Extension)  Level 3 (Green)    Forward Step Up  Left;15 reps;Hand Hold: 2;Step Height: 6"      Knee/Hip Exercises: Seated   Long Arc Quad  Left;Limitations    Long Arc Quad Limitations  Russian to L knee for neuro re-ed x 15 mins      Modalities   Modalities  Teacher, English as a foreign language Location  L VMO/Quad     Chartered certified accountant  Russian with Actor Parameters  10 secs on/off   x15 mins    Electrical Stimulation Goals  Neuromuscular facilitation                  PT Long Term Goals - 12/24/17 1424      PT LONG TERM GOAL #1   Title  Patient will be independent with HEP and it's progression.    Time  6    Period  Weeks    Status  On-going      PT LONG TERM GOAL #2  Title  Patient will demonstrate left knee AROM to 0-95 or greater to improve gait and stair ambulation.    Time  6    Period  Weeks    Status  On-going   0-89 degrees 12/24/17     PT LONG TERM GOAL #3   Title  Patient will report ability to walk for 15 minutes with least restrictive AD and pain less than 5/10 in left LE.    Time  6    Period  Weeks    Status  On-going      PT LONG TERM GOAL #4   Title  Patient will improve L LE strength to 3+/5 or greater to improve stability during functional tasks.    Time  6    Status  On-going            Plan - 01/07/18 1521    Clinical Impression Statement  Pt arrived today feeling that his LT LE is doing better with control. Pt still needs some tactile blocking with step ups for control. Russian stim continued with pt performing LAQs. Still with extensor Lag.    Rehab Potential  Fair    PT Frequency  2x / week    PT Duration  6 weeks    PT Treatment/Interventions  ADLs/Self Care Home Management;Gait training;Stair training;Neuromuscular re-education;Passive range of motion;Manual techniques;Moist Heat;Cryotherapy;Tax inspector;Therapeutic exercise;Therapeutic activities;Functional mobility  training;Ultrasound;Patient/family education    PT Next Visit Plan  cont with POC for Nustep, LE strengthening, modalities PRN for pain relief.    PT Home Exercise Plan  see patient education section    Consulted and Agree with Plan of Care  Patient;Family member/caregiver    Family Member Consulted  Friend       Patient will benefit from skilled therapeutic intervention in order to improve the following deficits and impairments:  Abnormal gait, Pain, Decreased balance, Decreased knowledge of precautions, Difficulty walking, Decreased strength, Decreased range of motion, Postural dysfunction, Decreased endurance, Decreased activity tolerance, Decreased safety awareness  Visit Diagnosis: Pain in left leg  Muscle weakness (generalized)  Difficulty in walking, not elsewhere classified     Problem List Patient Active Problem List   Diagnosis Date Noted  . Chronic hand pain, right 03/15/2016  . Carpal tunnel syndrome 09/22/2013  . Hypertension 07/19/2012  . Hyperlipidemia 07/19/2012  . Seizures ([place]) 07/19/2012    Christopher Salas,Christopher Salas, Christopher Salas 01/07/2018, 3:29 PM  [facility] [place], [place], [address] Phone: [phone]   Fax:  [phone]  Name: Christopher Salas MRN: [id number] Date of Birth: April 12, 1965

## 2018-01-08 ENCOUNTER — Ambulatory Visit: Payer: BLUE CROSS/BLUE SHIELD | Admitting: *Deleted

## 2018-01-08 DIAGNOSIS — M79605 Pain in left leg: Secondary | ICD-10-CM | POA: Diagnosis not present

## 2018-01-08 DIAGNOSIS — R262 Difficulty in walking, not elsewhere classified: Secondary | ICD-10-CM

## 2018-01-08 DIAGNOSIS — M6281 Muscle weakness (generalized): Secondary | ICD-10-CM

## 2018-01-08 NOTE — Therapy (Signed)
[facility] [place], [place], [address] Phone: [phone]   Fax:  [phone]  Physical Therapy Treatment  Patient Details  Name: Christopher Salas MRN: [id number] Date of Birth: January 03, 1966 Referring Provider (PT): Ferne Reus, [place]   Encounter Date: 01/08/2018  PT End of Session - 01/08/18 1007    Visit Number  10    Number of Visits  12    Date for PT Re-Evaluation  01/21/18    Authorization Type  Progress note every 10th visit             To MD 01-21-18    PT Start Time  0955    PT Stop Time  1045    PT Time Calculation (min)  50 min       Past Medical History:  Diagnosis Date  . Hyperlipidemia   . Hypertension   . Seizures ([place])     Past Surgical History:  Procedure Laterality Date  . HAND SURGERY      There were no vitals filed for this visit.  Subjective Assessment - 01/08/18 1005    Subjective  Pain into LT leg continues to keep me awake at night. The Dr said he wasn't sure if it would go away after surgery    Patient is accompained by:  Family member    Pertinent History  HTN    Limitations  Standing;Walking;House hold activities    How long can you stand comfortably?  5 minutes    How long can you walk comfortably?  10 minutes with walking stick    Diagnostic tests  MRI & X-Ray prior to surgery    Patient Stated Goals  decrease pain, stop falling    Currently in Pain?  Yes    Pain Score  6     Pain Location  Leg    Pain Orientation  Left    Pain Descriptors / Indicators  Discomfort    Pain Type  Surgical pain    Pain Onset  More than a month ago    Pain Frequency  Intermittent                       [facility] Adult PT Treatment/Exercise - 01/08/18 0001      Lumbar Exercises: Aerobic   Nustep  L6 x15 min      Knee/Hip Exercises: Standing   Terminal Knee Extension  Strengthening;Left;10 reps;Theraband;3 sets;20 reps   XTS pink x 30     Knee/Hip Exercises: Seated   Long Arc  Quad  Left;Limitations    Long Arc Quad Limitations  Russian to L knee for neuro re-ed x 10 mins      Modalities   Modalities  Teacher, English as a foreign language Location  L VMO/Quad     Printmaker Action  Russian stim to LT VMO with SAQs x 10 mins 10 secson/off    Electrical Stimulation Goals  Neuromuscular facilitation      Manual Therapy   Manual Therapy  Passive ROM;Other (comment)    Passive ROM  piriformis stretching LT LE and nerve glides LT LE with Pt supine                  PT Long Term Goals - 12/24/17 1424      PT LONG TERM GOAL #1   Title  Patient will be independent with HEP and it's progression.    Time  6    Period  Weeks    Status  On-going      PT LONG TERM GOAL #2   Title  Patient will demonstrate left knee AROM to 0-95 or greater to improve gait and stair ambulation.    Time  6    Period  Weeks    Status  On-going   0-89 degrees 12/24/17     PT LONG TERM GOAL #3   Title  Patient will report ability to walk for 15 minutes with least restrictive AD and pain less than 5/10 in left LE.    Time  6    Period  Weeks    Status  On-going      PT LONG TERM GOAL #4   Title  Patient will improve L LE strength to 3+/5 or greater to improve stability during functional tasks.    Time  6    Status  On-going            Plan - 01/08/18 1158    Clinical Impression Statement  Pt arrived today doing fairly well, but c/o the sharp pain that he intermittently gets in LT LE. He reports this pain continues to disturb his sleep. Rx focused on LT LE control and strengthening as well as stretches for LT pirifomis and LT sided nerve glides.  Pt felt good after session.    Clinical Presentation  Evolving    Rehab Potential  Fair    PT Frequency  2x / week    PT Duration  6 weeks    PT Treatment/Interventions  ADLs/Self Care Home Management;Gait training;Stair training;Neuromuscular re-education;Passive range  of motion;Manual techniques;Moist Heat;Cryotherapy;Tax inspector;Therapeutic exercise;Therapeutic activities;Functional mobility training;Ultrasound;Patient/family education    PT Next Visit Plan  cont with POC for Nustep, LE strengthening, modalities PRN for pain relief.    PT Home Exercise Plan  see patient education section    Consulted and Agree with Plan of Care  Patient;Family member/caregiver    Family Member Consulted  Friend       Patient will benefit from skilled therapeutic intervention in order to improve the following deficits and impairments:  Abnormal gait, Pain, Decreased balance, Decreased knowledge of precautions, Difficulty walking, Decreased strength, Decreased range of motion, Postural dysfunction, Decreased endurance, Decreased activity tolerance, Decreased safety awareness  Visit Diagnosis: Pain in left leg  Muscle weakness (generalized)  Difficulty in walking, not elsewhere classified     Problem List Patient Active Problem List   Diagnosis Date Noted  . Chronic hand pain, right 03/15/2016  . Carpal tunnel syndrome 09/22/2013  . Hypertension 07/19/2012  . Hyperlipidemia 07/19/2012  . Seizures ([place]) 07/19/2012    Christopher Salas,Christopher Salas, PTA 01/08/2018, 12:03 PM  [facility] [place], [place], [address] Phone: [phone]   Fax:  [phone]  Name: Christopher Salas MRN: [id number] Date of Birth: 06-24-1965

## 2018-01-14 ENCOUNTER — Ambulatory Visit: Payer: BLUE CROSS/BLUE SHIELD | Admitting: Physical Therapy

## 2018-01-14 ENCOUNTER — Encounter: Payer: Self-pay | Admitting: Physical Therapy

## 2018-01-14 DIAGNOSIS — M6281 Muscle weakness (generalized): Secondary | ICD-10-CM | POA: Diagnosis not present

## 2018-01-14 DIAGNOSIS — M79605 Pain in left leg: Secondary | ICD-10-CM

## 2018-01-14 DIAGNOSIS — R262 Difficulty in walking, not elsewhere classified: Secondary | ICD-10-CM

## 2018-01-14 NOTE — Therapy (Signed)
[facility] [place], [place], [address] Phone: [phone]   Fax:  [phone]  Physical Therapy Treatment  Patient Details  Name: Christopher Salas MRN: [id number] Date of Birth: [birth year] Referring Provider (PT): Ferne Reus, [place]   Encounter Date: 01/14/2018  PT End of Session - 01/14/18 1242    Visit Number  11    Number of Visits  12    Date for PT Re-Evaluation  01/21/18    Authorization Type  Progress note every 10th visit             To MD 01-21-18    PT Start Time  0945    PT Stop Time  1035    PT Time Calculation (min)  50 min    Activity Tolerance  Patient tolerated treatment well    Behavior During Therapy  [facility] for tasks assessed/performed       Past Medical History:  Diagnosis Date  . Hyperlipidemia   . Hypertension   . Seizures ([place])     Past Surgical History:  Procedure Laterality Date  . HAND SURGERY      There were no vitals filed for this visit.      [facility] PT Assessment - 01/14/18 0001      Assessment   Medical Diagnosis  Left leg weakness    Onset Date/Surgical Date  10/30/17    Next MD Visit  01/21/18    Prior Therapy  no      Precautions   Precautions  Back    Precaution Comments  No lifting or bending      Restrictions   Weight Bearing Restrictions  No      AROM   Right/Left Knee  Left    Left Knee Extension  6    Left Knee Flexion  85      PROM   Left Knee Extension  4    Left Knee Flexion  88                   [facility] Adult PT Treatment/Exercise - 01/14/18 0001      Exercises   Exercises  Knee/Hip      Lumbar Exercises: Aerobic   Nustep  L6 x 15 minutes      Knee/Hip Exercises: Standing   Heel Raises  Both;15 reps    Heel Raises Limitations  toe raises x 15 reps    Terminal Knee Extension  Strengthening;Left;10 reps;Theraband;3 sets;20 reps   XTS pink x 30   Theraband Level (Terminal Knee Extension)  Level 3 (Green)      Knee/Hip Exercises:  Seated   Long Arc Quad  Left;Limitations    Long Arc Quad Limitations  --    Ball Squeeze  x 15 holding 5 seconds each    Clamshell with TheraBand  Red    Abduction/Adduction   Strengthening;15 reps;Limitations    Abd/Adduction Limitations  green theraband/ ball squeezes      Knee/Hip Exercises: Supine   Short Arc [employer]    Short Arc Quad Sets Limitations  Russian E-stim for neuromuscular edu x 10 minutes    Heel Slides  AAROM;Strengthening;15 reps      Modalities   Modalities  Teacher, English as a foreign language Location  L VMO/Quad     Chartered certified accountant  Russian E-stim with SAQ x 10 minutes, 10/10    Electrical Stimulation Goals  Neuromuscular facilitation  Manual Therapy   Manual Therapy  Passive ROM;Other (comment)                  PT Long Term Goals - 01/14/18 1249      PT LONG TERM GOAL #1   Title  Patient will be independent with HEP and it's progression.    Time  6    Period  Weeks    Status  On-going      PT LONG TERM GOAL #2   Title  Patient will demonstrate left knee AROM to 0-95 or greater to improve gait and stair ambulation.    Time  6    Period  Weeks    Status  On-going      PT LONG TERM GOAL #3   Title  Patient will report ability to walk for 15 minutes with least restrictive AD and pain less than 5/10 in left LE.    Time  6      PT LONG TERM GOAL #4   Title  Patient will improve L LE strength to 3+/5 or greater to improve stability during functional tasks.    Time  6    Period  Weeks    Status  On-going            Plan - 01/14/18 1242    Clinical Impression Statement  Pt arriving to [facility] today doing well complaining of fall on Saturday where his left knee hit the floor but he has able to catch himself on the couch before his body hit the floor. Pt reporting  6/10 pain in L LE at beginning of session. Pt tolerating exercises well. Pt will need  re-assessment next visit.     Clinical Presentation  Evolving    Clinical Decision Making  Moderate    Rehab Potential  Fair    PT Frequency  2x / week    PT Duration  6 weeks    PT Next Visit Plan  cont with POC for Nustep, LE strengthening, modalities PRN for pain relief.    PT Home Exercise Plan  see patient education section    Consulted and Agree with Plan of Care  Patient;Family member/caregiver    Family Member Consulted  Friend       Patient will benefit from skilled therapeutic intervention in order to improve the following deficits and impairments:  Abnormal gait, Pain, Decreased balance, Decreased knowledge of precautions, Difficulty walking, Decreased strength, Decreased range of motion, Postural dysfunction, Decreased endurance, Decreased activity tolerance, Decreased safety awareness  Visit Diagnosis: Pain in left leg  Muscle weakness (generalized)  Difficulty in walking, not elsewhere classified     Problem List Patient Active Problem List   Diagnosis Date Noted  . Chronic hand pain, right 03/15/2016  . Carpal tunnel syndrome 09/22/2013  . Hypertension 07/19/2012  . Hyperlipidemia 07/19/2012  . Seizures ([place]) 07/19/2012    Christopher Salas, PT 01/14/2018, 1:02 PM  [facility] [place], [place], [address] Phone: [phone]   Fax:  [phone]  Name: Christopher Salas MRN: [id number] Date of Birth: April 03, 1965

## 2018-01-15 ENCOUNTER — Ambulatory Visit: Payer: BLUE CROSS/BLUE SHIELD | Admitting: *Deleted

## 2018-01-15 DIAGNOSIS — M6281 Muscle weakness (generalized): Secondary | ICD-10-CM | POA: Diagnosis not present

## 2018-01-15 DIAGNOSIS — R262 Difficulty in walking, not elsewhere classified: Secondary | ICD-10-CM

## 2018-01-15 DIAGNOSIS — M79605 Pain in left leg: Secondary | ICD-10-CM | POA: Diagnosis not present

## 2018-01-15 NOTE — Therapy (Signed)
[facility] [place], [place], [address] Phone: [phone]   Fax:  [phone]  Physical Therapy Treatment  Patient Details  Name: Christopher Salas MRN: [id number] Date of Birth: 06-Jan-1966 Referring Provider (PT): Ferne Reus, [place]   Encounter Date: 01/15/2018  PT End of Session - 01/15/18 1040    Visit Number  12    Number of Visits  12    Date for PT Re-Evaluation  01/21/18    Authorization Type  Progress note every 10th visit             To MD 01-21-18    PT Start Time  0951    PT Stop Time  1042    PT Time Calculation (min)  51 min       Past Medical History:  Diagnosis Date  . Hyperlipidemia   . Hypertension   . Seizures ([place])     Past Surgical History:  Procedure Laterality Date  . HAND SURGERY      There were no vitals filed for this visit.  Subjective Assessment - 01/15/18 1003    Subjective  Pain into LT leg continues to keep me awake at night. Over did it with the stretches and was sore    Patient is accompained by:  Family member    Pertinent History  HTN    Limitations  Standing;Walking;House hold activities    How long can you stand comfortably?  5 minutes    How long can you walk comfortably?  10 minutes with walking stick    Diagnostic tests  MRI & X-Ray prior to surgery    Patient Stated Goals  decrease pain, stop falling    Currently in Pain?  Yes    Pain Score  7     Pain Location  Leg    Pain Orientation  Left    Pain Descriptors / Indicators  Discomfort    Pain Onset  More than a month ago                       [facility] Adult PT Treatment/Exercise - 01/15/18 0001      Exercises   Exercises  Knee/Hip      Lumbar Exercises: Aerobic   Nustep  L6 x 15 minutes      Knee/Hip Exercises: Standing   Terminal Knee Extension  Strengthening;Left;10 reps;Theraband;3 sets;20 reps   XTS pink x 30     Knee/Hip Exercises: Seated   Abduction/Adduction    Strengthening;Limitations;20 reps;10 reps    Abd/Adduction Limitations  green theraband/ ball squeezes      Knee/Hip Exercises: Sidelying   Hip ABduction  Strengthening      Modalities   Modalities  Electrical engineer Stimulation Location  L VMO/Quad Russian Estim x 10 mins with SAQs 10 secs on/ off    Electrical Stimulation Goals  Neuromuscular facilitation      Manual Therapy   Other Manual Therapy  AAROM for LT hip abduction 2x10                  PT Long Term Goals - 01/15/18 1046      PT LONG TERM GOAL #1   Title  Patient will be independent with HEP and it's progression.    Period  Weeks    Status  On-going      PT LONG TERM GOAL #2   Title  Patient  will demonstrate left knee AROM to 0-95 or greater to improve gait and stair ambulation.    Time  6    Period  Weeks    Status  Achieved      PT LONG TERM GOAL #3   Title  Patient will report ability to walk for 15 minutes with least restrictive AD and pain less than 5/10 in left LE.    Baseline  5-10 mins now 01-15-18    Time  6    Period  Weeks    Status  Partially Met      PT LONG TERM GOAL #4   Title  Patient will improve L LE strength to 3+/5 or greater to improve stability during functional tasks.    Baseline  --   NM 3/5 on hip ABD   Period  Weeks    Status  On-going            Plan - 01/15/18 1043    Clinical Impression Statement  Pt arrives today reporting that he will F/U with MD on 01-21-18. Rx focused on LT LE control and strengthening. Pt did fairly well with therex today and has progressed with strengthening, but unable to meet LTG due to weakness still.  He is able to lift his leg in supine now with hip abduction , but needs assistance for repeated repitiitions. He has met ROM goal for LT and has partially met LTG for ambulation. He can walk 5-10 mins now , but unable to meet 15 mins yet. He continues to have complaints of a sharp shooting pain  from his LB  that intermittently radiates into LT LE    Clinical Presentation  Evolving    Rehab Potential  Fair    PT Frequency  2x / week    PT Duration  6 weeks    PT Treatment/Interventions  ADLs/Self Care Home Management;Gait training;Stair training;Neuromuscular re-education;Passive range of motion;Manual techniques;Moist Heat;Cryotherapy;Tax inspector;Therapeutic exercise;Therapeutic activities;Functional mobility training;Ultrasound;Patient/family education    PT Next Visit Plan  cont with POC for Nustep, LE strengthening, modalities PRN for pain relief.      MD note 01-21-18    PT Home Exercise Plan  see patient education section    Consulted and Agree with Plan of Care  Patient;Family member/caregiver    Family Member Consulted  Friend       Patient will benefit from skilled therapeutic intervention in order to improve the following deficits and impairments:  Abnormal gait, Pain, Decreased balance, Decreased knowledge of precautions, Difficulty walking, Decreased strength, Decreased range of motion, Postural dysfunction, Decreased endurance, Decreased activity tolerance, Decreased safety awareness  Visit Diagnosis: Pain in left leg  Muscle weakness (generalized)  Difficulty in walking, not elsewhere classified     Problem List Patient Active Problem List   Diagnosis Date Noted  . Chronic hand pain, right 03/15/2016  . Carpal tunnel syndrome 09/22/2013  . Hypertension 07/19/2012  . Hyperlipidemia 07/19/2012  . Seizures ([place]) 07/19/2012    Laronica Bhagat,[name], PTA 01/15/2018, 1:27 PM  [facility] [place], [place], [address] Phone: [phone]   Fax:  [phone]  Name: Christopher Salas MRN: [id number] Date of Birth: Nov 28, 1965

## 2018-01-24 ENCOUNTER — Ambulatory Visit: Payer: BLUE CROSS/BLUE SHIELD | Attending: Physician Assistant | Admitting: Physical Therapy

## 2018-01-24 DIAGNOSIS — M6281 Muscle weakness (generalized): Secondary | ICD-10-CM

## 2018-01-24 DIAGNOSIS — M79605 Pain in left leg: Secondary | ICD-10-CM

## 2018-01-24 DIAGNOSIS — M545 Low back pain, unspecified: Secondary | ICD-10-CM

## 2018-01-24 DIAGNOSIS — R262 Difficulty in walking, not elsewhere classified: Secondary | ICD-10-CM | POA: Diagnosis not present

## 2018-01-24 NOTE — Therapy (Signed)
[facility] [place], [place], [address] Phone: [phone]   Fax:  [phone]  Physical Therapy Treatment  Patient Details  Name: Christopher Salas MRN: [id number] Date of Birth: 05-24-1965 Referring Provider (PT): Ferne Reus, [place]   Encounter Date: 01/24/2018  PT End of Session - 01/24/18 1641    Visit Number  13    Number of Visits  24    Date for PT Re-Evaluation  01/21/18    Authorization Type  Progress note every 10th visit  To MD 03/04/18    PT Start Time  1640    PT Stop Time  1728    PT Time Calculation (min)  48 min    Activity Tolerance  Patient tolerated treatment well    Behavior During Therapy  [facility] for tasks assessed/performed       Past Medical History:  Diagnosis Date  . Hyperlipidemia   . Hypertension   . Seizures ([place])     Past Surgical History:  Procedure Laterality Date  . HAND SURGERY      There were no vitals filed for this visit.      [facility] PT Assessment - 01/24/18 0001      Assessment   Medical Diagnosis  Left leg weakness; lumbar radiculopathy    Onset Date/Surgical Date  10/30/17    Next MD Visit  03/04/18      Precautions   Precautions  Back    Precaution Comments  No lifting or bending      Restrictions   Weight Bearing Restrictions  No                   [facility] Adult PT Treatment/Exercise - 01/24/18 0001      Therapeutic Activites    Therapeutic Activities  Other Therapeutic Activities    Other Therapeutic Activities  breathing technique for exercise      Exercises   Exercises  Lumbar;Knee/Hip      Lumbar Exercises: Aerobic   Nustep  L6 x 15 minutes      Lumbar Exercises: Supine   Ab Set  15 reps;5 seconds    Glut Set  20 reps;2 seconds    Clam  20 reps;3 seconds    Bent Knee Raise  20 reps;2 seconds    Bridge  --      Knee/Hip Exercises: Standing   Heel Raises  Both;20 reps    Heel Raises Limitations  toe raises x 20 reps    Terminal Knee  Extension  Strengthening;Left;10 reps;Theraband;3 sets    Terminal Knee Extension Limitations  Pink XTS      Modalities   Modalities  --                  PT Long Term Goals - 01/15/18 1046      PT LONG TERM GOAL #1   Title  Patient will be independent with HEP and it's progression.    Period  Weeks    Status  On-going      PT LONG TERM GOAL #2   Title  Patient will demonstrate left knee AROM to 0-95 or greater to improve gait and stair ambulation.    Time  6    Period  Weeks    Status  Achieved      PT LONG TERM GOAL #3   Title  Patient will report ability to walk for 15 minutes with least restrictive AD and pain less than 5/10 in left LE.  Baseline  5-10 mins now 01-15-18    Time  6    Period  Weeks    Status  Partially Met      PT LONG TERM GOAL #4   Title  Patient will improve L LE strength to 3+/5 or greater to improve stability during functional tasks.    Baseline  --   NM 3/5 on hip ABD   Period  Weeks    Status  On-going            Plan - 01/24/18 1740    Clinical Impression Statement  Patient was able to tolerate treatment fairly. Supine lumbar TEs were performed today. Patient required multiple cues for breathing technique. Patient and PT reviewed breathing technique extensively however patient was unable to carryover technique with exercise without verbal cuing. [place] Stim was not performed to emphasize lumbar exercises and core activiation.    Clinical Presentation  Evolving    Clinical Decision Making  Moderate    Rehab Potential  Fair    PT Frequency  2x / week    PT Duration  6 weeks    PT Treatment/Interventions  ADLs/Self Care Home Management;Gait training;Stair training;Neuromuscular re-education;Passive range of motion;Manual techniques;Moist Heat;Cryotherapy;Tax inspector;Therapeutic exercise;Therapeutic activities;Functional mobility training;Ultrasound;Patient/family education    PT  Next Visit Plan  cont with POC for Nustep, LE strengthening, modalities PRN for pain relief.      MD note 01-21-18    Consulted and Agree with Plan of Care  Patient;Family member/caregiver    Family Member Consulted  Friend       Patient will benefit from skilled therapeutic intervention in order to improve the following deficits and impairments:  Abnormal gait, Pain, Decreased balance, Decreased knowledge of precautions, Difficulty walking, Decreased strength, Decreased range of motion, Postural dysfunction, Decreased endurance, Decreased activity tolerance, Decreased safety awareness  Visit Diagnosis: Pain in left leg  Muscle weakness (generalized)  Difficulty in walking, not elsewhere classified  Low back pain, unspecified back pain laterality, unspecified chronicity, unspecified whether sciatica present     Problem List Patient Active Problem List   Diagnosis Date Noted  . Chronic hand pain, right 03/15/2016  . Carpal tunnel syndrome 09/22/2013  . Hypertension 07/19/2012  . Hyperlipidemia 07/19/2012  . Seizures ([place]) 07/19/2012   Gabriela Eves, PT, DPT 01/24/2018, 5:51 PM  [facility] [address] [place], [place], [address] Phone: [phone]   Fax:  [phone]  Name: SANTINO KINSELLA MRN: [id number] Date of Birth: September 28, 1965

## 2018-01-28 ENCOUNTER — Encounter: Payer: BLUE CROSS/BLUE SHIELD | Admitting: Physical Therapy

## 2018-01-29 ENCOUNTER — Ambulatory Visit: Payer: BLUE CROSS/BLUE SHIELD | Admitting: Physical Therapy

## 2018-01-29 ENCOUNTER — Encounter: Payer: Self-pay | Admitting: Physical Therapy

## 2018-01-29 DIAGNOSIS — M6281 Muscle weakness (generalized): Secondary | ICD-10-CM | POA: Diagnosis not present

## 2018-01-29 DIAGNOSIS — M79605 Pain in left leg: Secondary | ICD-10-CM | POA: Diagnosis not present

## 2018-01-29 DIAGNOSIS — R262 Difficulty in walking, not elsewhere classified: Secondary | ICD-10-CM | POA: Diagnosis not present

## 2018-01-29 DIAGNOSIS — M545 Low back pain, unspecified: Secondary | ICD-10-CM

## 2018-01-29 NOTE — Therapy (Signed)
[facility] [place], [place], [address] Phone: [phone]   Fax:  [phone]  Physical Therapy Treatment  Patient Details  Name: Christopher Salas MRN: [id number] Date of Birth: 1965-05-20 Referring Provider (PT): Ferne Reus, [place]   Encounter Date: 01/29/2018  PT End of Session - 01/29/18 1155    Visit Number  14    Number of Visits  24    Date for PT Re-Evaluation  01/21/18    Authorization Type  Progress note every 10th visit  To MD 03/04/18    PT Start Time  0957    PT Stop Time  1036    PT Time Calculation (min)  39 min       Past Medical History:  Diagnosis Date  . Hyperlipidemia   . Hypertension   . Seizures ([place])     Past Surgical History:  Procedure Laterality Date  . HAND SURGERY      There were no vitals filed for this visit.  Subjective Assessment - 01/29/18 1156    Subjective  Continued left leg pain.    Currently in Pain?  Yes    Pain Score  7     Pain Location  Leg    Pain Orientation  Left    Pain Descriptors / Indicators  Discomfort    Pain Onset  More than a month ago                       [facility] Adult PT Treatment/Exercise - 01/29/18 0001      Exercises   Exercises  Knee/Hip      Lumbar Exercises: Aerobic   Nustep  Level 6 x 15 minutes.      Knee/Hip Exercises: Supine   Short Arc Quad Sets Limitations  20 minutes with 10 sec extension holds f/b 10 sec rest facilitated with Bi-Phasic e'stim.                  PT Long Term Goals - 01/15/18 1046      PT LONG TERM GOAL #1   Title  Patient will be independent with HEP and it's progression.    Period  Weeks    Status  On-going      PT LONG TERM GOAL #2   Title  Patient will demonstrate left knee AROM to 0-95 or greater to improve gait and stair ambulation.    Time  6    Period  Weeks    Status  Achieved      PT LONG TERM GOAL #3   Title  Patient will report ability to walk for 15 minutes with  least restrictive AD and pain less than 5/10 in left LE.    Baseline  5-10 mins now 01-15-18    Time  6    Period  Weeks    Status  Partially Met      PT LONG TERM GOAL #4   Title  Patient will improve L LE strength to 3+/5 or greater to improve stability during functional tasks.    Baseline  --   NM 3/5 on hip ABD   Period  Weeks    Status  On-going            Plan - 01/29/18 1201    Clinical Impression Statement  Patient arriving late for treatment today.  Continues to report numbnessover his left thigh and needed verbal/tactile cues for during SAQ exercise.    PT Treatment/Interventions  ADLs/Self Care Home Management;Gait training;Stair training;Neuromuscular re-education;Passive range of motion;Manual techniques;Moist Heat;Cryotherapy;Tax inspector;Therapeutic exercise;Therapeutic activities;Functional mobility training;Ultrasound;Patient/family education    PT Next Visit Plan  cont with POC for Nustep, LE strengthening, modalities PRN for pain relief.      MD note 01-21-18    PT Home Exercise Plan  see patient education section    Consulted and Agree with Plan of Care  Patient;Family member/caregiver    Family Member Consulted  Friend       Patient will benefit from skilled therapeutic intervention in order to improve the following deficits and impairments:  Abnormal gait, Pain, Decreased balance, Decreased knowledge of precautions, Difficulty walking, Decreased strength, Decreased range of motion, Postural dysfunction, Decreased endurance, Decreased activity tolerance, Decreased safety awareness  Visit Diagnosis: Pain in left leg  Muscle weakness (generalized)  Difficulty in walking, not elsewhere classified  Low back pain, unspecified back pain laterality, unspecified chronicity, unspecified whether sciatica present     Problem List Patient Active Problem List   Diagnosis Date Noted  . Chronic hand pain, right  03/15/2016  . Carpal tunnel syndrome 09/22/2013  . Hypertension 07/19/2012  . Hyperlipidemia 07/19/2012  . Seizures ([place]) 07/19/2012    Christopher Salas, [place] MPT 01/29/2018, 12:28 PM  [facility] [address] [place], [place], [address] Phone: [phone]   Fax:  [phone]  Name: Christopher Salas MRN: [id number] Date of Birth: [birth year]

## 2018-01-30 ENCOUNTER — Ambulatory Visit: Payer: BLUE CROSS/BLUE SHIELD | Admitting: Physical Therapy

## 2018-01-30 ENCOUNTER — Encounter: Payer: Self-pay | Admitting: Physical Therapy

## 2018-01-30 DIAGNOSIS — M79605 Pain in left leg: Secondary | ICD-10-CM

## 2018-01-30 DIAGNOSIS — R262 Difficulty in walking, not elsewhere classified: Secondary | ICD-10-CM | POA: Diagnosis not present

## 2018-01-30 DIAGNOSIS — M6281 Muscle weakness (generalized): Secondary | ICD-10-CM

## 2018-01-30 DIAGNOSIS — M545 Low back pain, unspecified: Secondary | ICD-10-CM

## 2018-01-30 NOTE — Therapy (Signed)
[facility] [place], [place], [address] Phone: [phone]   Fax:  [phone]  Physical Therapy Treatment  Patient Details  Name: Christopher Salas MRN: [id number] Date of Birth: 04/30/1965 Referring Provider (PT): Ferne Reus, [place]   Encounter Date: 01/30/2018  PT End of Session - 01/30/18 0734    Visit Number  15    Number of Visits  24    Date for PT Re-Evaluation  03/04/18   per NO signed by MD on 01/21/2018   Authorization Type  Progress note every 10th visit  To MD 03/04/18    PT Start Time  0732    PT Stop Time  0817    PT Time Calculation (min)  45 min    Activity Tolerance  Patient tolerated treatment well    Behavior During Therapy  [facility] for tasks assessed/performed       Past Medical History:  Diagnosis Date  . Hyperlipidemia   . Hypertension   . Seizures ([place])     Past Surgical History:  Procedure Laterality Date  . HAND SURGERY      There were no vitals filed for this visit.  Subjective Assessment - 01/30/18 0732    Subjective  Reports that the pain still isn't better in the medial and lateral aspect of L thigh. Reports that he has some swelling in L ankle as well as up LLE. Reports going back to ibuprofen as well.     Patient is accompained by:  Family member   Friend   Pertinent History  HTN    Limitations  Standing;Walking;House hold activities    How long can you stand comfortably?  5 minutes    How long can you walk comfortably?  10 minutes with walking stick    Diagnostic tests  MRI & X-Ray prior to surgery    Patient Stated Goals  decrease pain, stop falling    Currently in Pain?  Yes    Pain Score  6     Pain Location  Leg    Pain Orientation  Left    Pain Descriptors / Indicators  Discomfort    Pain Type  Surgical pain    Pain Onset  More than a month ago         [facility] PT Assessment - 01/30/18 0001      Assessment   Medical Diagnosis  Left leg weakness; lumbar  radiculopathy    Onset Date/Surgical Date  10/30/17    Next MD Visit  03/04/18      Precautions   Precautions  Back    Precaution Comments  No lifting or bending      Restrictions   Weight Bearing Restrictions  No                   [facility] Adult PT Treatment/Exercise - 01/30/18 0001      Exercises   Exercises  Knee/Hip;Lumbar      Lumbar Exercises: Aerobic   Nustep  L5 x16 min      Lumbar Exercises: Supine   Ab Set  10 reps;5 seconds    Glut Set  10 reps;5 seconds    Clam  20 reps;3 seconds   red theraband   Bent Knee Raise  15 reps      Knee/Hip Exercises: Standing   Heel Raises  Both;20 reps    Heel Raises Limitations  toe raises x 20 reps    Terminal Knee Extension  Strengthening;Left;2 sets;10 reps;Theraband  Theraband Level (Terminal Knee Extension)  Level 3 (Green)      Knee/Hip Exercises: Supine   Hip Adduction Isometric  Strengthening;15 reps    Other Supine Knee/Hip Exercises  L hip adduction/abduction x15 reps                  PT Long Term Goals - 01/15/18 1046      PT LONG TERM GOAL #1   Title  Patient will be independent with HEP and it's progression.    Period  Weeks    Status  On-going      PT LONG TERM GOAL #2   Title  Patient will demonstrate left knee AROM to 0-95 or greater to improve gait and stair ambulation.    Time  6    Period  Weeks    Status  Achieved      PT LONG TERM GOAL #3   Title  Patient will report ability to walk for 15 minutes with least restrictive AD and pain less than 5/10 in left LE.    Baseline  5-10 mins now 01-15-18    Time  6    Period  Weeks    Status  Partially Met      PT LONG TERM GOAL #4   Title  Patient will improve L LE strength to 3+/5 or greater to improve stability during functional tasks.    Baseline  --   NM 3/5 on hip ABD   Period  Weeks    Status  On-going            Plan - 01/30/18 0820    Clinical Impression Statement  Patient arrived in clinic with reports of  continued LLE pain and numbness as well as swelling around L ankle and into calf and thigh. Patient was encouraged to contact his MD regarding the swelling to make him aware and get recommendations. Patient progressed through knee/hip strengthening exercises with LLE focus. Patient again heavily cued for proper core activation and audible counting to avoid valsalva maneuver. No reports of any increased pain throughout treatment.    Rehab Potential  Fair    PT Frequency  2x / week    PT Duration  6 weeks    PT Treatment/Interventions  ADLs/Self Care Home Management;Gait training;Stair training;Neuromuscular re-education;Passive range of motion;Manual techniques;Moist Heat;Cryotherapy;Tax inspector;Therapeutic exercise;Therapeutic activities;Functional mobility training;Ultrasound;Patient/family education    PT Next Visit Plan  cont with POC for Nustep, LE strengthening, modalities PRN for pain relief.      MD note 01-21-18    PT Home Exercise Plan  see patient education section    Consulted and Agree with Plan of Care  Patient;Family member/caregiver    Family Member Consulted  Friend       Patient will benefit from skilled therapeutic intervention in order to improve the following deficits and impairments:  Abnormal gait, Pain, Decreased balance, Decreased knowledge of precautions, Difficulty walking, Decreased strength, Decreased range of motion, Postural dysfunction, Decreased endurance, Decreased activity tolerance, Decreased safety awareness  Visit Diagnosis: Pain in left leg  Muscle weakness (generalized)  Difficulty in walking, not elsewhere classified  Low back pain, unspecified back pain laterality, unspecified chronicity, unspecified whether sciatica present     Problem List Patient Active Problem List   Diagnosis Date Noted  . Chronic hand pain, right 03/15/2016  . Carpal tunnel syndrome 09/22/2013  . Hypertension 07/19/2012  .  Hyperlipidemia 07/19/2012  . Seizures ([place]) 07/19/2012    Standley Brooking, PTA  01/30/2018, 8:44 AM  [facility] [place], [place], [address] Phone: [phone]   Fax:  [phone]  Name: HAMLIN DEVINE MRN: [id number] Date of Birth: 1965-07-11

## 2018-01-31 ENCOUNTER — Ambulatory Visit: Payer: BLUE CROSS/BLUE SHIELD | Admitting: Family Medicine

## 2018-01-31 ENCOUNTER — Encounter: Payer: Self-pay | Admitting: Family Medicine

## 2018-01-31 VITALS — BP 127/86 | HR 92 | Temp 97.3°F | Ht 71.0 in | Wt 264.4 lb

## 2018-01-31 DIAGNOSIS — E785 Hyperlipidemia, unspecified: Secondary | ICD-10-CM | POA: Diagnosis not present

## 2018-01-31 DIAGNOSIS — R569 Unspecified convulsions: Secondary | ICD-10-CM | POA: Diagnosis not present

## 2018-01-31 DIAGNOSIS — I10 Essential (primary) hypertension: Secondary | ICD-10-CM

## 2018-01-31 DIAGNOSIS — N529 Male erectile dysfunction, unspecified: Secondary | ICD-10-CM | POA: Diagnosis not present

## 2018-01-31 MED ORDER — PHENOBARBITAL 100 MG PO TABS: 100.0000 mg | ORAL_TABLET | Freq: Every day | ORAL | 3 refills | Status: DC

## 2018-01-31 MED ORDER — ROSUVASTATIN CALCIUM 20 MG PO TABS: 20.0000 mg | ORAL_TABLET | Freq: Every day | ORAL | 3 refills | Status: DC

## 2018-01-31 MED ORDER — OLMESARTAN MEDOXOMIL 20 MG PO TABS: 20.0000 mg | ORAL_TABLET | Freq: Every day | ORAL | 3 refills | Status: DC

## 2018-01-31 MED ORDER — SILDENAFIL CITRATE 20 MG PO TABS: 20.0000 mg | ORAL_TABLET | ORAL | 1 refills | Status: DC | PRN

## 2018-01-31 MED ORDER — PHENOBARBITAL 16.2 MG PO TABS: 32.4000 mg | ORAL_TABLET | Freq: Every day | ORAL | 3 refills | Status: DC

## 2018-01-31 MED ORDER — FENOFIBRATE 48 MG PO TABS: 48.0000 mg | ORAL_TABLET | Freq: Every day | ORAL | 3 refills | Status: DC

## 2018-01-31 NOTE — Progress Notes (Signed)
BP 127/86   Pulse 92   Temp (!) 97.3 F (36.3 C) (Oral)   Ht '5\' 11"'$  (1.803 m)   Wt 264 lb 6.4 oz (119.9 kg)   BMI 36.88 kg/m    Subjective:    Patient ID: Christopher Salas, male    DOB: 1966-03-20, 52 y.o.   MRN: [id number]  HPI: Christopher Salas is a 52 y.o. male presenting on 01/31/2018 for Hypertension (6 month follow up) and Hyperlipidemia   HPI Hypertension Patient is currently on Benicar, and their blood pressure today is 127/86. Patient denies any lightheadedness or dizziness. Patient denies headaches, blurred vision, chest pains, shortness of breath, or weakness. Denies any side effects from medication and is content with current medication.   Hyperlipidemia Patient is coming in for recheck of his hyperlipidemia. The patient is currently taking niacin and omega-3's and Crestor. They deny any issues with myalgias or history of liver damage from it. They deny any focal numbness or weakness or chest pain.   Seizure disorder recheck Patient is coming in for seizure disorder recheck, he has been on phenobarbital for quite some time and has been stable and has not had a seizure at all time and had them and he is doing well on the medications.  He denies any major side effects.  After his back surgery he says he has been having some erectile dysfunction issues and mainly keeping an erection has been a challenge, he has been placed on a lot of other medications because of the surgery including pain medication so he knows is probably due to that I would like to try something from the meantime to see if it would help with his erectile dysfunction.  Relevant past medical, surgical, family and social history reviewed and updated as indicated. Interim medical history since our last visit reviewed. Allergies and medications reviewed and updated.  Review of Systems  Constitutional: Negative for chills and fever.  Eyes: Negative for visual disturbance.  Respiratory: Negative for shortness of  breath and wheezing.   Cardiovascular: Negative for chest pain and leg swelling.  Musculoskeletal: Negative for back pain and gait problem.  Skin: Negative for rash.  Neurological: Negative for dizziness, seizures, weakness, light-headedness and numbness.  All other systems reviewed and are negative.   Per HPI unless specifically indicated above   Allergies as of 01/31/2018   No Known Allergies     Medication List        Accurate as of 01/31/18  4:47 PM. Always use your most recent med list.          aspirin 81 MG tablet Take 81 mg by mouth daily.   fenofibrate 48 MG tablet Commonly known as:  TRICOR Take 1 tablet (48 mg total) by mouth daily.   GABAPENTIN PO Take 600 mg by mouth.   gabapentin 400 MG capsule Commonly known as:  NEURONTIN Take 400 mg by mouth 3 (three) times daily.   ibuprofen 800 MG tablet Commonly known as:  ADVIL,MOTRIN Take 800 mg by mouth every 8 (eight) hours as needed.   Icosapent Ethyl 1 g Caps Take 2 g by mouth 2 (two) times daily.   IRON-150 PO Take 2 tablets by mouth daily.   meloxicam 15 MG tablet Commonly known as:  MOBIC Take 1 tablet (15 mg total) by mouth daily.   niacin 500 MG CR tablet Commonly known as:  NIASPAN Take 1 tablet (500 mg total) by mouth daily with breakfast.   olmesartan 20  MG tablet Commonly known as:  BENICAR Take 1 tablet (20 mg total) by mouth daily.   omega-3 acid ethyl esters 1 g capsule Commonly known as:  LOVAZA Take 2 capsules (2 g total) by mouth 2 (two) times daily.   PHENObarbital 100 MG tablet Commonly known as:  LUMINAL Take 1 tablet (100 mg total) by mouth at bedtime.   phenobarbital 16.2 MG tablet Commonly known as:  LUMINAL Take 2 tablets (32.4 mg total) by mouth at bedtime. Take 2 tablets (32.4 mg) at bedtime   rosuvastatin 20 MG tablet Commonly known as:  CRESTOR Take 1 tablet (20 mg total) by mouth daily.          Objective:    BP 127/86   Pulse 92   Temp (!) 97.3 F  (36.3 C) (Oral)   Ht '5\' 11"'$  (1.803 m)   Wt 264 lb 6.4 oz (119.9 kg)   BMI 36.88 kg/m   Wt Readings from Last 3 Encounters:  01/31/18 264 lb 6.4 oz (119.9 kg)  08/21/17 231 lb (104.8 kg)  07/27/17 236 lb (107 kg)    Physical Exam  Constitutional: He is oriented to person, place, and time. He appears well-developed and well-nourished. No distress.  Eyes: Pupils are equal, round, and reactive to light. Conjunctivae and EOM are normal. No scleral icterus.  Neck: Neck supple. No thyromegaly present.  Cardiovascular: Normal rate, regular rhythm, normal heart sounds and intact distal pulses.  No murmur heard. Pulmonary/Chest: Effort normal and breath sounds normal. No respiratory distress. He has no wheezes.  Musculoskeletal: Normal range of motion. He exhibits no edema.  Lymphadenopathy:    He has no cervical adenopathy.  Neurological: He is alert and oriented to person, place, and time. Coordination normal.  Skin: Skin is warm and dry. No rash noted. He is not diaphoretic.  Psychiatric: He has a normal mood and affect. His behavior is normal.  Nursing note and vitals reviewed.       Assessment & Plan:   Problem List Items Addressed This Visit      Cardiovascular and Mediastinum   Hypertension - Primary   Relevant Medications   fenofibrate (TRICOR) 48 MG tablet   rosuvastatin (CRESTOR) 20 MG tablet   olmesartan (BENICAR) 20 MG tablet   sildenafil (REVATIO) 20 MG tablet   Other Relevant Orders   CBC with Differential/Platelet   CMP14+EGFR     Other   Hyperlipidemia   Relevant Medications   fenofibrate (TRICOR) 48 MG tablet   rosuvastatin (CRESTOR) 20 MG tablet   olmesartan (BENICAR) 20 MG tablet   sildenafil (REVATIO) 20 MG tablet   Other Relevant Orders   Lipid panel   Seizures ([facility])   Relevant Medications   PHENObarbital (LUMINAL) 100 MG tablet   phenobarbital (LUMINAL) 16.2 MG tablet    Other Visit Diagnoses    Erectile dysfunction, unspecified erectile  dysfunction type           Follow up plan: Return in about 6 months (around 08/01/2018), or if symptoms worsen or fail to improve, for Hypertension and seizures and cholesterol.  Counseling provided for all of the vaccine components Orders Placed This Encounter  Procedures  . CBC with Differential/Platelet  . CMP14+EGFR  . Lipid panel    Caryl Pina, MD [facility] 01/31/2018, 4:47 PM

## 2018-02-01 LAB — CBC WITH DIFFERENTIAL/PLATELET
Basophils Absolute: 0 10*3/uL (ref 0.0–0.2)
Basos: 0 %
EOS (ABSOLUTE): 0.2 10*3/uL (ref 0.0–0.4)
Eos: 4 %
Hematocrit: 41.8 % (ref 37.5–51.0)
Hemoglobin: 14.9 g/dL (ref 13.0–17.7)
Immature Grans (Abs): 0 10*3/uL (ref 0.0–0.1)
Immature Granulocytes: 0 %
Lymphocytes Absolute: 1.7 10*3/uL (ref 0.7–3.1)
Lymphs: 32 %
MCH: 33.3 pg — ABNORMAL HIGH (ref 26.6–33.0)
MCHC: 35.6 g/dL (ref 31.5–35.7)
MCV: 93 fL (ref 79–97)
Monocytes Absolute: 0.3 10*3/uL (ref 0.1–0.9)
Monocytes: 6 %
Neutrophils Absolute: 3.1 10*3/uL (ref 1.4–7.0)
Neutrophils: 58 %
Platelets: 257 10*3/uL (ref 150–450)
RBC: 4.48 x10E6/uL (ref 4.14–5.80)
RDW: 12.5 % (ref 12.3–15.4)
WBC: 5.4 10*3/uL (ref 3.4–10.8)

## 2018-02-01 LAB — CMP14+EGFR
ALT: 37 IU/L (ref 0–44)
AST: 48 IU/L — ABNORMAL HIGH (ref 0–40)
Albumin/Globulin Ratio: 2 (ref 1.2–2.2)
Albumin: 4.9 g/dL (ref 3.5–5.5)
Alkaline Phosphatase: 114 IU/L (ref 39–117)
BUN/Creatinine Ratio: 12 (ref 9–20)
BUN: 12 mg/dL (ref 6–24)
Bilirubin Total: 0.3 mg/dL (ref 0.0–1.2)
CO2: 23 mmol/L (ref 20–29)
Calcium: 10.1 mg/dL (ref 8.7–10.2)
Chloride: 99 mmol/L (ref 96–106)
Creatinine, Ser: 0.97 mg/dL (ref 0.76–1.27)
GFR calc Af Amer: 102 mL/min/{1.73_m2} (ref >59)
GFR calc non Af Amer: 88 mL/min/{1.73_m2} (ref >59)
Globulin, Total: 2.5 g/dL (ref 1.5–4.5)
Glucose: 124 mg/dL — ABNORMAL HIGH (ref 65–99)
Potassium: 4.3 mmol/L (ref 3.5–5.2)
Sodium: 142 mmol/L (ref 134–144)
Total Protein: 7.4 g/dL (ref 6.0–8.5)

## 2018-02-01 LAB — LIPID PANEL
Chol/HDL Ratio: 4.3 ratio (ref 0.0–5.0)
Cholesterol, Total: 175 mg/dL (ref 100–199)
HDL: 41 mg/dL (ref >39)
LDL Calculated: 71 mg/dL (ref 0–99)
Triglycerides: 313 mg/dL — ABNORMAL HIGH (ref 0–149)
VLDL Cholesterol Cal: 63 mg/dL — ABNORMAL HIGH (ref 5–40)

## 2018-02-05 ENCOUNTER — Ambulatory Visit: Payer: BLUE CROSS/BLUE SHIELD | Admitting: *Deleted

## 2018-02-05 DIAGNOSIS — R262 Difficulty in walking, not elsewhere classified: Secondary | ICD-10-CM

## 2018-02-05 DIAGNOSIS — M79605 Pain in left leg: Secondary | ICD-10-CM | POA: Diagnosis not present

## 2018-02-05 DIAGNOSIS — M545 Low back pain, unspecified: Secondary | ICD-10-CM

## 2018-02-05 DIAGNOSIS — M6281 Muscle weakness (generalized): Secondary | ICD-10-CM

## 2018-02-05 NOTE — Therapy (Signed)
[facility] [place], [place], [address] Phone: [phone]   Fax:  [phone]  Physical Therapy Treatment  Patient Details  Name: Christopher Salas MRN: [id number] Date of Birth: April 18, 1965 Referring Provider (PT): Ferne Reus, [place]   Encounter Date: 02/05/2018  PT End of Session - 02/05/18 1659    Visit Number  16    Number of Visits  24    Date for PT Re-Evaluation  03/04/18    Authorization Type  Progress note every 10th visit  To MD 03/04/18    PT Start Time  1517    PT Stop Time  1737    PT Time Calculation (min)  44 min       Past Medical History:  Diagnosis Date  . Hyperlipidemia   . Hypertension   . Seizures ([place])     Past Surgical History:  Procedure Laterality Date  . BACK SURGERY    . HAND SURGERY      There were no vitals filed for this visit.  Subjective Assessment - 02/05/18 1657    Subjective  pain is about the same in LT thigh and ankle is still swelling    Patient is accompained by:  Family member    Pertinent History  HTN    Limitations  Standing;Walking;House hold activities    How long can you stand comfortably?  5 minutes    How long can you walk comfortably?  10 minutes with walking stick    Diagnostic tests  MRI & X-Ray prior to surgery    Patient Stated Goals  decrease pain, stop falling    Currently in Pain?  Yes    Pain Score  6     Pain Location  Leg    Pain Orientation  Left    Pain Descriptors / Indicators  Discomfort    Pain Onset  More than a month ago    Pain Frequency  Intermittent                       [facility] Adult PT Treatment/Exercise - 02/05/18 0001      Exercises   Exercises  Knee/Hip;Lumbar      Lumbar Exercises: Aerobic   Nustep  L5 x16 min      Lumbar Exercises: Machines for Strengthening   Cybex Knee Extension  10 #s 3x10  5 secs isometric holds at 70-80 degrees        Lumbar Exercises: Supine   Ab Set  10 reps;5 seconds    Bent  Knee Raise  20 reps      Knee/Hip Exercises: Standing   Heel Raises  --    Heel Raises Limitations  --    Terminal Knee Extension  Strengthening;Left;10 reps;Theraband;3 sets   XTS pink     Knee/Hip Exercises: Supine   Hip Adduction Isometric  Strengthening;20 reps   ball                 PT Long Term Goals - 01/15/18 1046      PT LONG TERM GOAL #1   Title  Patient will be independent with HEP and it's progression.    Period  Weeks    Status  On-going      PT LONG TERM GOAL #2   Title  Patient will demonstrate left knee AROM to 0-95 or greater to improve gait and stair ambulation.    Time  6    Period  Weeks  Status  Achieved      PT LONG TERM GOAL #3   Title  Patient will report ability to walk for 15 minutes with least restrictive AD and pain less than 5/10 in left LE.    Baseline  5-10 mins now 01-15-18    Time  6    Period  Weeks    Status  Partially Met      PT LONG TERM GOAL #4   Title  Patient will improve L LE strength to 3+/5 or greater to improve stability during functional tasks.    Baseline  --   NM 3/5 on hip ABD   Period  Weeks    Status  On-going            Plan - 02/05/18 1700    Clinical Impression Statement  Pt arrived today doing about the same with c/o LT LE pain and nummbness. Pt was guided through core and LT LE strengthening exs with cues needed for proper technique and avoid valsava maneuver..    Clinical Presentation  Evolving    Clinical Decision Making  Moderate    Rehab Potential  Fair    PT Frequency  2x / week    PT Duration  6 weeks    PT Treatment/Interventions  ADLs/Self Care Home Management;Gait training;Stair training;Neuromuscular re-education;Passive range of motion;Manual techniques;Moist Heat;Cryotherapy;Tax inspector;Therapeutic exercise;Therapeutic activities;Functional mobility training;Ultrasound;Patient/family education    PT Next Visit Plan  cont with POC for  Nustep, LE strengthening, modalities PRN for pain relief.         PT Home Exercise Plan  see patient education section    Consulted and Agree with Plan of Care  Patient;Family member/caregiver       Patient will benefit from skilled therapeutic intervention in order to improve the following deficits and impairments:  Abnormal gait, Pain, Decreased balance, Decreased knowledge of precautions, Difficulty walking, Decreased strength, Decreased range of motion, Postural dysfunction, Decreased endurance, Decreased activity tolerance, Decreased safety awareness  Visit Diagnosis: Pain in left leg  Muscle weakness (generalized)  Difficulty in walking, not elsewhere classified  Low back pain, unspecified back pain laterality, unspecified chronicity, unspecified whether sciatica present     Problem List Patient Active Problem List   Diagnosis Date Noted  . Chronic hand pain, right 03/15/2016  . Carpal tunnel syndrome 09/22/2013  . Hypertension 07/19/2012  . Hyperlipidemia 07/19/2012  . Seizures ([place]) 07/19/2012    Bennett Ram,[name], PTA 02/05/2018, 5:51 PM  [facility] [place], [place], [address] Phone: [phone]   Fax:  [phone]  Name: Christopher Salas MRN: [id number] Date of Birth: [birth year]

## 2018-02-07 ENCOUNTER — Telehealth: Payer: Self-pay | Admitting: *Deleted

## 2018-02-07 ENCOUNTER — Ambulatory Visit: Payer: BLUE CROSS/BLUE SHIELD | Admitting: *Deleted

## 2018-02-07 DIAGNOSIS — M6281 Muscle weakness (generalized): Secondary | ICD-10-CM | POA: Diagnosis not present

## 2018-02-07 DIAGNOSIS — R262 Difficulty in walking, not elsewhere classified: Secondary | ICD-10-CM | POA: Diagnosis not present

## 2018-02-07 DIAGNOSIS — M545 Low back pain, unspecified: Secondary | ICD-10-CM

## 2018-02-07 DIAGNOSIS — M79605 Pain in left leg: Secondary | ICD-10-CM

## 2018-02-07 NOTE — Telephone Encounter (Signed)
Go ahead and send the prescription to his new pharmacy, diagnosis erectile dysfunction

## 2018-02-07 NOTE — Therapy (Signed)
[facility] [place], [place], [address] Phone: [phone]   Fax:  [phone]  Physical Therapy Treatment  Patient Details  Name: Christopher Salas MRN: [id number] Date of Birth: 10-Oct-1965 Referring Provider (PT): Ferne Reus, [place]   Encounter Date: 02/07/2018  PT End of Session - 02/07/18 1745    Visit Number  17    Number of Visits  24    Date for PT Re-Evaluation  03/04/18    Authorization Type  Progress note every 10th visit  To MD 03/04/18    PT Start Time  1324    PT Stop Time  1733    PT Time Calculation (min)  47 min       Past Medical History:  Diagnosis Date  . Hyperlipidemia   . Hypertension   . Seizures ([place])     Past Surgical History:  Procedure Laterality Date  . BACK SURGERY    . HAND SURGERY      There were no vitals filed for this visit.  Subjective Assessment - 02/07/18 1703    Subjective  LT quad was very sore after last RX    Patient is accompained by:  Family member    Pertinent History  HTN    Limitations  Standing;Walking;House hold activities    How long can you stand comfortably?  5 minutes    How long can you walk comfortably?  10 minutes with walking stick    Diagnostic tests  MRI & X-Ray prior to surgery    Patient Stated Goals  decrease pain, stop falling    Currently in Pain?  Yes    Pain Score  6     Pain Location  Leg    Pain Orientation  Left    Pain Descriptors / Indicators  Discomfort    Pain Onset  More than a month ago    Pain Frequency  Intermittent                       [facility] Adult PT Treatment/Exercise - 02/07/18 0001      Exercises   Exercises  Knee/Hip;Lumbar      Lumbar Exercises: Aerobic   Nustep  L5 x16 min   lat 5 mins LEs only     Lumbar Exercises: Seated   Long Arc Quad on Chair  Strengthening;Left;3 sets;10 reps   increased extension -5 degrees   LAQ on Chair Weights (lbs)  5# through available motion      Lumbar  Exercises: Supine   Clam  20 reps;3 seconds   red theraband,  Hip adduction with 4# ball x 20   Bent Knee Raise  20 reps    Bridge  20 reps;2 seconds      Knee/Hip Exercises: Standing   Terminal Knee Extension  Strengthening;Left;10 reps;Theraband;3 sets   XTS pink     Knee/Hip Exercises: Supine   Hip Adduction Isometric  Strengthening;20 reps   ball                 PT Long Term Goals - 01/15/18 1046      PT LONG TERM GOAL #1   Title  Patient will be independent with HEP and it's progression.    Period  Weeks    Status  On-going      PT LONG TERM GOAL #2   Title  Patient will demonstrate left knee AROM to 0-95 or greater to improve gait and stair ambulation.  Time  6    Period  Weeks    Status  Achieved      PT LONG TERM GOAL #3   Title  Patient will report ability to walk for 15 minutes with least restrictive AD and pain less than 5/10 in left LE.    Baseline  5-10 mins now 01-15-18    Time  6    Period  Weeks    Status  Partially Met      PT LONG TERM GOAL #4   Title  Patient will improve L LE strength to 3+/5 or greater to improve stability during functional tasks.    Baseline  --   NM 3/5 on hip ABD   Period  Weeks    Status  On-going            Plan - 02/07/18 1752    Clinical Impression Statement  Pt arrived today doing fairly well. He was able to complete all therex today and showed improved quad control with standing TKEs and LAQs with 5#s. He was also able to tolerate core exs better today as well.    Clinical Presentation  Evolving    Rehab Potential  Fair    PT Frequency  2x / week    PT Next Visit Plan  cont with POC for Nustep, LE strengthening, modalities PRN for pain relief.         PT Home Exercise Plan  see patient education section    Consulted and Agree with Plan of Care  Patient;Family member/caregiver    Family Member Consulted  Friend       Patient will benefit from skilled therapeutic intervention in order to improve  the following deficits and impairments:  Abnormal gait, Pain, Decreased balance, Decreased knowledge of precautions, Difficulty walking, Decreased strength, Decreased range of motion, Postural dysfunction, Decreased endurance, Decreased activity tolerance, Decreased safety awareness  Visit Diagnosis: Pain in left leg  Muscle weakness (generalized)  Difficulty in walking, not elsewhere classified  Low back pain, unspecified back pain laterality, unspecified chronicity, unspecified whether sciatica present     Problem List Patient Active Problem List   Diagnosis Date Noted  . Chronic hand pain, right 03/15/2016  . Carpal tunnel syndrome 09/22/2013  . Hypertension 07/19/2012  . Hyperlipidemia 07/19/2012  . Seizures ([place]) 07/19/2012    Ellis Koffler,[name], PTA 02/07/2018, 5:58 PM  [facility] [place], [place], [address] Phone: [phone]   Fax:  [phone]  Name: Christopher Salas MRN: [id number] Date of Birth: 1965/06/01

## 2018-02-07 NOTE — Telephone Encounter (Signed)
TC to patient, received fax from [facility] for Rx for Sildenafil 20 mg pt would like Rx to [facility] and cancel one that was just sent to [facility] Please send new Rx to [facility] and include Dx

## 2018-02-08 MED ORDER — SILDENAFIL CITRATE 20 MG PO TABS: 20.0000 mg | ORAL_TABLET | ORAL | 1 refills | Status: DC | PRN

## 2018-02-08 NOTE — Addendum Note (Signed)
Addended by: Zannie Cove on: 02/08/2018 07:53 AM   Modules accepted: Orders

## 2018-02-11 ENCOUNTER — Ambulatory Visit: Payer: BLUE CROSS/BLUE SHIELD | Admitting: Physical Therapy

## 2018-02-11 ENCOUNTER — Encounter: Payer: Self-pay | Admitting: Physical Therapy

## 2018-02-11 DIAGNOSIS — R262 Difficulty in walking, not elsewhere classified: Secondary | ICD-10-CM | POA: Diagnosis not present

## 2018-02-11 DIAGNOSIS — M545 Low back pain, unspecified: Secondary | ICD-10-CM

## 2018-02-11 DIAGNOSIS — M6281 Muscle weakness (generalized): Secondary | ICD-10-CM

## 2018-02-11 DIAGNOSIS — M79605 Pain in left leg: Secondary | ICD-10-CM

## 2018-02-11 NOTE — Therapy (Signed)
[facility] [place], [place], [address] Phone: [phone]   Fax:  [phone]  Physical Therapy Treatment  Patient Details  Name: Christopher Salas MRN: [id number] Date of Birth: [birth year] Referring Provider (PT): Ferne Reus, [place]   Encounter Date: 02/11/2018  PT End of Session - 02/11/18 0835    Visit Number  18    Number of Visits  24    Date for PT Re-Evaluation  03/04/18    Authorization Type  Progress note every 10th visit  To MD 03/04/18    PT Start Time  0821    PT Stop Time  0900   2 units per late arrival   PT Time Calculation (min)  39 min    Activity Tolerance  Patient tolerated treatment well    Behavior During Therapy  [facility] for tasks assessed/performed       Past Medical History:  Diagnosis Date  . Hyperlipidemia   . Hypertension   . Seizures ([place])     Past Surgical History:  Procedure Laterality Date  . BACK SURGERY    . HAND SURGERY      There were no vitals filed for this visit.  Subjective Assessment - 02/11/18 0833    Subjective  Reports that his L shoulder is sore and wonders if that is from using walking stick.     Patient is accompained by:  Family member   Family friend   Pertinent History  HTN    Limitations  Standing;Walking;House hold activities    How long can you stand comfortably?  5 minutes    How long can you walk comfortably?  10 minutes with walking stick    Diagnostic tests  MRI & X-Ray prior to surgery    Patient Stated Goals  decrease pain, stop falling    Currently in Pain?  Yes    Pain Score  5     Pain Location  Leg    Pain Orientation  Left    Pain Descriptors / Indicators  Discomfort    Pain Type  Surgical pain    Pain Onset  More than a month ago         [facility] PT Assessment - 02/11/18 0001      Assessment   Medical Diagnosis  Left leg weakness; lumbar radiculopathy    Referring Provider (PT)  Ferne Reus, PA-C    Onset Date/Surgical Date   10/30/17    Next MD Visit  03/04/18      Precautions   Precautions  Back    Precaution Comments  No lifting or bending      Restrictions   Weight Bearing Restrictions  No                   [facility] Adult PT Treatment/Exercise - 02/11/18 0001      Exercises   Exercises  Knee/Hip;Lumbar      Lumbar Exercises: Aerobic   Nustep  L5 x15 min   LEs only     Lumbar Exercises: Supine   Clam  20 reps;3 seconds   Green theraband   Bridge with [employer]  15 reps      Knee/Hip Exercises: Standing   Terminal Knee Extension  Strengthening;Left;15 reps;Theraband    Theraband Level (Terminal Knee Extension)  Level 3 (Green)    Forward Step Up  Left;5 reps;Hand Hold: 2;Step Height: 8"      Knee/Hip Exercises: Seated   Long Arc Quad  Strengthening;Left;2 sets;10  reps;Limitations;Weights    Long Arc Quad Weight  5 lbs.      Knee/Hip Exercises: Supine   Straight Leg Raises  Strengthening;Left;15 reps                  PT Long Term Goals - 01/15/18 1046      PT LONG TERM GOAL #1   Title  Patient will be independent with HEP and it's progression.    Period  Weeks    Status  On-going      PT LONG TERM GOAL #2   Title  Patient will demonstrate left knee AROM to 0-95 or greater to improve gait and stair ambulation.    Time  6    Period  Weeks    Status  Achieved      PT LONG TERM GOAL #3   Title  Patient will report ability to walk for 15 minutes with least restrictive AD and pain less than 5/10 in left LE.    Baseline  5-10 mins now 01-15-18    Time  6    Period  Weeks    Status  Partially Met      PT LONG TERM GOAL #4   Title  Patient will improve L LE strength to 3+/5 or greater to improve stability during functional tasks.    Baseline  --   NM 3/5 on hip ABD   Period  Weeks    Status  On-going            Plan - 02/11/18 0911    Clinical Impression Statement  Patient tolerated today's treatment fairly well as more focus concentrated on L quad  strengthening and hip strengthening. Patient reported shock type sensation down LLE with Nustep as well as at night. Patient continues to lack full control of L quad with forward step ups to 8" step to simulate ladder at work.     Rehab Potential  Fair    PT Frequency  2x / week    PT Duration  6 weeks    PT Treatment/Interventions  ADLs/Self Care Home Management;Gait training;Stair training;Neuromuscular re-education;Passive range of motion;Manual techniques;Moist Heat;Cryotherapy;Tax inspector;Therapeutic exercise;Therapeutic activities;Functional mobility training;Ultrasound;Patient/family education    PT Next Visit Plan  cont with POC for Nustep, LE strengthening, modalities PRN for pain relief.         PT Home Exercise Plan  see patient education section    Consulted and Agree with Plan of Care  Patient;Family member/caregiver    Family Member Consulted  Friend       Patient will benefit from skilled therapeutic intervention in order to improve the following deficits and impairments:  Abnormal gait, Pain, Decreased balance, Decreased knowledge of precautions, Difficulty walking, Decreased strength, Decreased range of motion, Postural dysfunction, Decreased endurance, Decreased activity tolerance, Decreased safety awareness  Visit Diagnosis: Pain in left leg  Muscle weakness (generalized)  Difficulty in walking, not elsewhere classified  Low back pain, unspecified back pain laterality, unspecified chronicity, unspecified whether sciatica present     Problem List Patient Active Problem List   Diagnosis Date Noted  . Chronic hand pain, right 03/15/2016  . Carpal tunnel syndrome 09/22/2013  . Hypertension 07/19/2012  . Hyperlipidemia 07/19/2012  . Seizures ([place]) 07/19/2012    Standley Brooking, PTA 02/11/2018, 9:43 AM  [facility] [address] [place], [place], [address] Phone:  [phone]   Fax:  [phone]  Name: Christopher Salas MRN: [id number] Date of Birth: 1965-12-18

## 2018-02-12 ENCOUNTER — Encounter: Payer: Self-pay | Admitting: Physical Therapy

## 2018-02-12 ENCOUNTER — Ambulatory Visit: Payer: BLUE CROSS/BLUE SHIELD | Admitting: Physical Therapy

## 2018-02-12 DIAGNOSIS — R262 Difficulty in walking, not elsewhere classified: Secondary | ICD-10-CM

## 2018-02-12 DIAGNOSIS — M545 Low back pain, unspecified: Secondary | ICD-10-CM

## 2018-02-12 DIAGNOSIS — M79605 Pain in left leg: Secondary | ICD-10-CM

## 2018-02-12 DIAGNOSIS — M6281 Muscle weakness (generalized): Secondary | ICD-10-CM

## 2018-02-12 NOTE — Therapy (Signed)
[facility] [place], [place], [address] Phone: [phone]   Fax:  [phone]  Physical Therapy Treatment  Patient Details  Name: Christopher Salas MRN: [id number] Date of Birth: 08-11-1965 Referring Provider (PT): Ferne Reus, [place]   Encounter Date: 02/12/2018  PT End of Session - 02/12/18 1658    Visit Number  19    Number of Visits  24    Date for PT Re-Evaluation  03/04/18    Authorization Type  Progress note every 10th visit  To MD 03/04/18    PT Start Time  1642    PT Stop Time  1733    PT Time Calculation (min)  51 min    Activity Tolerance  Patient tolerated treatment well    Behavior During Therapy  [facility] for tasks assessed/performed       Past Medical History:  Diagnosis Date  . Hyperlipidemia   . Hypertension   . Seizures ([place])     Past Surgical History:  Procedure Laterality Date  . BACK SURGERY    . HAND SURGERY      There were no vitals filed for this visit.  Subjective Assessment - 02/12/18 1654    Subjective  Reports LE pain today.    Patient is accompained by:  Family member    Pertinent History  HTN    Limitations  Standing;Walking;House hold activities    How long can you stand comfortably?  5 minutes    How long can you walk comfortably?  10 minutes with walking stick    Diagnostic tests  MRI & X-Ray prior to surgery    Patient Stated Goals  decrease pain, stop falling    Currently in Pain?  Yes    Pain Score  6     Pain Location  Leg    Pain Orientation  Left    Pain Descriptors / Indicators  Sore    Pain Type  Surgical pain    Pain Onset  More than a month ago         [facility] PT Assessment - 02/12/18 0001      Assessment   Medical Diagnosis  Left leg weakness; lumbar radiculopathy    Referring Provider (PT)  Ferne Reus, PA-C    Onset Date/Surgical Date  10/30/17    Next MD Visit  03/04/18      Precautions   Precautions  Back    Precaution Comments  No lifting or  bending      Restrictions   Weight Bearing Restrictions  No                   [facility] Adult PT Treatment/Exercise - 02/12/18 0001      Lumbar Exercises: Aerobic   Nustep  L5 x15 min   LE only     Lumbar Exercises: Supine   Clam  20 reps;3 seconds   green theraband   Bent Knee Raise  15 reps    Bridge with [employer]  15 reps   reported LBP     Knee/Hip Exercises: Standing   Terminal Knee Extension  Strengthening;Left;15 reps;Theraband    Theraband Level (Terminal Knee Extension)  Level 3 (Green)      Knee/Hip Exercises: Seated   Long Arc Quad  Strengthening;Left;2 sets;10 reps;Limitations;Weights    Long Arc Quad Weight  5 lbs.      Knee/Hip Exercises: Supine   Straight Leg Raises  Strengthening;Left;10 reps  PT Long Term Goals - 01/15/18 1046      PT LONG TERM GOAL #1   Title  Patient will be independent with HEP and it's progression.    Period  Weeks    Status  On-going      PT LONG TERM GOAL #2   Title  Patient will demonstrate left knee AROM to 0-95 or greater to improve gait and stair ambulation.    Time  6    Period  Weeks    Status  Achieved      PT LONG TERM GOAL #3   Title  Patient will report ability to walk for 15 minutes with least restrictive AD and pain less than 5/10 in left LE.    Baseline  5-10 mins now 01-15-18    Time  6    Period  Weeks    Status  Partially Met      PT LONG TERM GOAL #4   Title  Patient will improve L LE strength to 3+/5 or greater to improve stability during functional tasks.    Baseline  --   NM 3/5 on hip ABD   Period  Weeks    Status  On-going            Plan - 02/12/18 1746    Clinical Impression Statement  Patient presented in clinic with increased LLE soreness. Patient reports LLE buckling twice today. Patient able to complete approximately 75% of full LAQ. Patient still limited with sleeping due to LLE pain. Reported pain in low back region surrounding incision during  bridge with squeeze. VCs throughout treatment to avoid valsalva maneuver especially during SLR and bridging. Patient reported 7/10 LLE soreness following end of the treatment.    Rehab Potential  Fair    PT Frequency  2x / week    PT Duration  6 weeks    PT Treatment/Interventions  ADLs/Self Care Home Management;Gait training;Stair training;Neuromuscular re-education;Passive range of motion;Manual techniques;Moist Heat;Cryotherapy;Tax inspector;Therapeutic exercise;Therapeutic activities;Functional mobility training;Ultrasound;Patient/family education    PT Next Visit Plan  cont with POC for Nustep, LE strengthening, modalities PRN for pain relief.         PT Home Exercise Plan  see patient education section    Consulted and Agree with Plan of Care  Patient;Family member/caregiver    Family Member Consulted  Friend       Patient will benefit from skilled therapeutic intervention in order to improve the following deficits and impairments:  Abnormal gait, Pain, Decreased balance, Decreased knowledge of precautions, Difficulty walking, Decreased strength, Decreased range of motion, Postural dysfunction, Decreased endurance, Decreased activity tolerance, Decreased safety awareness  Visit Diagnosis: Pain in left leg  Muscle weakness (generalized)  Difficulty in walking, not elsewhere classified  Low back pain, unspecified back pain laterality, unspecified chronicity, unspecified whether sciatica present     Problem List Patient Active Problem List   Diagnosis Date Noted  . Chronic hand pain, right 03/15/2016  . Carpal tunnel syndrome 09/22/2013  . Hypertension 07/19/2012  . Hyperlipidemia 07/19/2012  . Seizures ([place]) 07/19/2012    Standley Brooking, PTA 02/12/2018, 5:54 PM  [facility] [address] [place], [place], [address] Phone: [phone]   Fax:  [phone]  Name: Christopher Salas MRN: [id number] Date of Birth: 1965-08-18

## 2018-02-13 ENCOUNTER — Encounter: Payer: BLUE CROSS/BLUE SHIELD | Admitting: Physical Therapy

## 2018-02-18 ENCOUNTER — Encounter: Payer: BLUE CROSS/BLUE SHIELD | Admitting: Physical Therapy

## 2018-02-19 ENCOUNTER — Ambulatory Visit: Payer: BLUE CROSS/BLUE SHIELD | Attending: Physician Assistant | Admitting: *Deleted

## 2018-02-19 DIAGNOSIS — M79605 Pain in left leg: Secondary | ICD-10-CM | POA: Insufficient documentation

## 2018-02-19 DIAGNOSIS — M6281 Muscle weakness (generalized): Secondary | ICD-10-CM | POA: Diagnosis not present

## 2018-02-19 DIAGNOSIS — M545 Low back pain: Secondary | ICD-10-CM | POA: Diagnosis not present

## 2018-02-19 DIAGNOSIS — R262 Difficulty in walking, not elsewhere classified: Secondary | ICD-10-CM | POA: Diagnosis not present

## 2018-02-19 NOTE — Therapy (Addendum)
[facility] [place], [place], [address] Phone: [phone]   Fax:  [phone]  Physical Therapy Treatment  Progress Note Reporting Period 01/14/18 to 02/19/18  See note below for Objective Data and Assessment of Progress/Goals.    Patient Details  Name: Christopher Salas MRN: [id number] Date of Birth: [birth year] Referring Provider (PT): Ferne Reus, [place]   Encounter Date: 02/19/2018  PT End of Session - 02/19/18 1051    Visit Number  20    Number of Visits  24    Date for PT Re-Evaluation  03/04/18    Authorization Type  Progress note every 10th visit  To MD 03/04/18    PT Start Time  0952    PT Stop Time  1041    PT Time Calculation (min)  49 min       Past Medical History:  Diagnosis Date  . Hyperlipidemia   . Hypertension   . Seizures ([place])     Past Surgical History:  Procedure Laterality Date  . BACK SURGERY    . HAND SURGERY      There were no vitals filed for this visit.  Subjective Assessment - 02/19/18 1002    Subjective  Pain in LT thigh wakes me up. Fell at home outside.    Patient is accompained by:  Family member    Pertinent History  HTN    Limitations  Standing;Walking;House hold activities    How long can you stand comfortably?  5 minutes    How long can you walk comfortably?  10 minutes with walking stick    Diagnostic tests  MRI & X-Ray prior to surgery    Patient Stated Goals  decrease pain, stop falling    Currently in Pain?  Yes    Pain Score  6     Pain Orientation  Left    Pain Descriptors / Indicators  Sore    Pain Type  Surgical pain    Pain Onset  More than a month ago                       [facility] Adult PT Treatment/Exercise - 02/19/18 0001      Exercises   Exercises  Knee/Hip;Lumbar      Lumbar Exercises: Aerobic   Nustep  L6 x16 min   lat 5 mins LEs only     Lumbar Exercises: Supine   Clam  20 reps;3 seconds   green theraband   Bent Knee Raise   15 reps    Bridge with Ball Squeeze  15 reps;5 seconds   reported LBP     Knee/Hip Exercises: Standing   Terminal Knee Extension  Strengthening;Left;15 reps;Theraband    Theraband Level (Terminal Knee Extension)  Level 3 (Green)      Knee/Hip Exercises: Seated   Long Arc Quad  Strengthening;Left;10 reps;Limitations;Weights;3 sets    [employer] Weight  5 lbs.                  PT Long Term Goals - 01/15/18 1046      PT LONG TERM GOAL #1   Title  Patient will be independent with HEP and it's progression.    Period  Weeks    Status  On-going      PT LONG TERM GOAL #2   Title  Patient will demonstrate left knee AROM to 0-95 or greater to improve gait and stair ambulation.    Time  6    Period  Weeks    Status  Achieved      PT LONG TERM GOAL #3   Title  Patient will report ability to walk for 15 minutes with least restrictive AD and pain less than 5/10 in left LE.    Baseline  5-10 mins now 01-15-18    Time  6    Period  Weeks    Status  Partially Met      PT LONG TERM GOAL #4   Title  Patient will improve L LE strength to 3+/5 or greater to improve stability during functional tasks.    Baseline  --   NM 3/5 on hip ABD   Period  Weeks    Status  On-going            Plan - 02/19/18 1053    Clinical Impression Statement  Pt arrived today not doing as well due to falling at home. He had icreased soreness upper and lower back as well as decreased quad control with CKC/ OKC exs. Increased extensor lag today. Pt advised to use walking stick at all times.    Clinical Presentation  Evolving    Clinical Decision Making  Moderate    Rehab Potential  Fair    PT Frequency  2x / week    PT Duration  6 weeks    PT Treatment/Interventions  ADLs/Self Care Home Management;Gait training;Stair training;Neuromuscular re-education;Passive range of motion;Manual techniques;Moist Heat;Cryotherapy;Tax inspector;Therapeutic  exercise;Therapeutic activities;Functional mobility training;Ultrasound;Patient/family education    PT Next Visit Plan  cont with POC for Nustep, LE strengthening, modalities PRN for pain relief.         PT Home Exercise Plan  see patient education section    Consulted and Agree with Plan of Care  Patient;Family member/caregiver    Family Member Consulted  Friend       Patient will benefit from skilled therapeutic intervention in order to improve the following deficits and impairments:  Abnormal gait, Pain, Decreased balance, Decreased knowledge of precautions, Difficulty walking, Decreased strength, Decreased range of motion, Postural dysfunction, Decreased endurance, Decreased activity tolerance, Decreased safety awareness  Visit Diagnosis: Pain in left leg  Muscle weakness (generalized)  Difficulty in walking, not elsewhere classified     Problem List Patient Active Problem List   Diagnosis Date Noted  . Chronic hand pain, right 03/15/2016  . Carpal tunnel syndrome 09/22/2013  . Hypertension 07/19/2012  . Hyperlipidemia 07/19/2012  . Seizures ([place]) 07/19/2012    Savina Olshefski,[name], PTA 02/19/2018, 12:06 PM  [facility] [place], [place], [address] Phone: [phone]   Fax:  [phone]  Name: Christopher Salas MRN: [id number] Date of Birth: [birth year]

## 2018-02-20 ENCOUNTER — Ambulatory Visit: Payer: BLUE CROSS/BLUE SHIELD | Admitting: Physical Therapy

## 2018-02-20 ENCOUNTER — Encounter: Payer: Self-pay | Admitting: Physical Therapy

## 2018-02-20 DIAGNOSIS — M6281 Muscle weakness (generalized): Secondary | ICD-10-CM | POA: Diagnosis not present

## 2018-02-20 DIAGNOSIS — R262 Difficulty in walking, not elsewhere classified: Secondary | ICD-10-CM

## 2018-02-20 DIAGNOSIS — M79605 Pain in left leg: Secondary | ICD-10-CM | POA: Diagnosis not present

## 2018-02-20 DIAGNOSIS — M545 Low back pain: Secondary | ICD-10-CM | POA: Diagnosis not present

## 2018-02-20 NOTE — Therapy (Signed)
[facility] [place], [place], [address] Phone: [phone]   Fax:  [phone]  Physical Therapy Treatment  Patient Details  Name: Christopher Salas MRN: [id number] Date of Birth: [birth year] Referring Provider (PT): Ferne Reus, [place]   Encounter Date: 02/20/2018  PT End of Session - 02/20/18 0815    Visit Number  21    Number of Visits  24    Date for PT Re-Evaluation  03/04/18    Authorization Type  Progress note every 10th visit  To MD 03/04/18    PT Start Time  0731    PT Stop Time  0813    PT Time Calculation (min)  42 min    Activity Tolerance  Patient tolerated treatment well    Behavior During Therapy  [facility] for tasks assessed/performed       Past Medical History:  Diagnosis Date  . Hyperlipidemia   . Hypertension   . Seizures ([place])     Past Surgical History:  Procedure Laterality Date  . BACK SURGERY    . HAND SURGERY      There were no vitals filed for this visit.  Subjective Assessment - 02/20/18 0741    Subjective  Patient reported some soreness from his fall yesterday at home    Patient is accompained by:  Family member   Friend   Pertinent History  HTN    Limitations  Standing;Walking;House hold activities    How long can you stand comfortably?  5 minutes    How long can you walk comfortably?  10 minutes with walking stick    Diagnostic tests  MRI & X-Ray prior to surgery    Patient Stated Goals  decrease pain, stop falling    Currently in Pain?  Yes    Pain Score  7     Pain Location  Leg    Pain Orientation  Left    Pain Descriptors / Indicators  Discomfort;Sore    Pain Type  Surgical pain    Pain Onset  More than a month ago    Pain Frequency  Intermittent    Aggravating Factors   bending knee/walking    Pain Relieving Factors  meds and ice                       [facility] Adult PT Treatment/Exercise - 02/20/18 0001      Exercises   Exercises  Knee/Hip;Lumbar       Lumbar Exercises: Aerobic   Nustep  43mn L6 UE/LE activity, monitored      Lumbar Exercises: Supine   Bent Knee Raise  20 reps;5 seconds;Limitations    Bent Knee Raise Limitations  educational cues visual and verbal   with core activation   Bridge with clamshell  20 reps   green t-band   Straight Leg Raise  3 seconds   2x10 with core activation     Knee/Hip Exercises: Standing   Terminal Knee Extension  Strengthening;Left;Theraband;20 reps    Theraband Level (Terminal Knee Extension)  Level 3 (Green)    Forward Step Up  Limitations    Forward Step Up Limitations  unable to perform 2" step ups due to weakness      Knee/Hip Exercises: Seated   Long Arc Quad  Strengthening;Left;2 sets;10 reps;Weights    Long Arc Quad Weight  5 lbs.      EAcupuncturistLocation  L VMO/Quad Russian Estim x 10 mins  with SAQs 10 secs on/ off    Electrical Stimulation Goals  Neuromuscular facilitation                  PT Long Term Goals - 01/15/18 1046      PT LONG TERM GOAL #1   Title  Patient will be independent with HEP and it's progression.    Period  Weeks    Status  On-going      PT LONG TERM GOAL #2   Title  Patient will demonstrate left knee AROM to 0-95 or greater to improve gait and stair ambulation.    Time  6    Period  Weeks    Status  Achieved      PT LONG TERM GOAL #3   Title  Patient will report ability to walk for 15 minutes with least restrictive AD and pain less than 5/10 in left LE.    Baseline  5-10 mins now 01-15-18    Time  6    Period  Weeks    Status  Partially Met      PT LONG TERM GOAL #4   Title  Patient will improve L LE strength to 3+/5 or greater to improve stability during functional tasks.    Baseline  --   NM 3/5 on hip ABD   Period  Weeks    Status  On-going            Plan - 02/20/18 2330    Clinical Impression Statement  Patient tolerated treatment fair today due to weakness and discomfort. Patient  unable to perform step ups due to weakness. Today focused on core activation with correct technique with breathing, then quad strengthening after. Educated patient on daily core activation exercises to improve core stability. Patient responded well upon removal of electrodes. Goals ongoing at this time.     Rehab Potential  Fair    PT Frequency  2x / week    PT Duration  6 weeks    PT Treatment/Interventions  ADLs/Self Care Home Management;Gait training;Stair training;Neuromuscular re-education;Passive range of motion;Manual techniques;Moist Heat;Cryotherapy;Tax inspector;Therapeutic exercise;Therapeutic activities;Functional mobility training;Ultrasound;Patient/family education    PT Next Visit Plan  cont with POC for Nustep, LE strengthening, modalities PRN for pain relief.         Consulted and Agree with Plan of Care  Patient       Patient will benefit from skilled therapeutic intervention in order to improve the following deficits and impairments:  Abnormal gait, Pain, Decreased balance, Decreased knowledge of precautions, Difficulty walking, Decreased strength, Decreased range of motion, Postural dysfunction, Decreased endurance, Decreased activity tolerance, Decreased safety awareness  Visit Diagnosis: Pain in left leg  Muscle weakness (generalized)  Difficulty in walking, not elsewhere classified     Problem List Patient Active Problem List   Diagnosis Date Noted  . Chronic hand pain, right 03/15/2016  . Carpal tunnel syndrome 09/22/2013  . Hypertension 07/19/2012  . Hyperlipidemia 07/19/2012  . Seizures ([place]) 07/19/2012    Yashira Offenberger P, PTA 02/20/2018, 8:22 AM  [facility] [place], [place], [address] Phone: [phone]   Fax:  [phone]  Name: DARIEL BETZER MRN: [id number] Date of Birth: 02/25/1966

## 2018-02-21 ENCOUNTER — Telehealth: Payer: Self-pay | Admitting: Family Medicine

## 2018-02-21 MED ORDER — SILDENAFIL CITRATE 20 MG PO TABS: 20.0000 mg | ORAL_TABLET | ORAL | 1 refills | Status: DC | PRN

## 2018-02-21 NOTE — Telephone Encounter (Signed)
rx resent and pt is aware.

## 2018-02-25 ENCOUNTER — Ambulatory Visit: Payer: BLUE CROSS/BLUE SHIELD | Admitting: Physical Therapy

## 2018-02-25 ENCOUNTER — Encounter: Payer: Self-pay | Admitting: Physical Therapy

## 2018-02-25 DIAGNOSIS — R262 Difficulty in walking, not elsewhere classified: Secondary | ICD-10-CM | POA: Diagnosis not present

## 2018-02-25 DIAGNOSIS — M6281 Muscle weakness (generalized): Secondary | ICD-10-CM | POA: Diagnosis not present

## 2018-02-25 DIAGNOSIS — M79605 Pain in left leg: Secondary | ICD-10-CM

## 2018-02-25 DIAGNOSIS — M545 Low back pain, unspecified: Secondary | ICD-10-CM

## 2018-02-25 NOTE — Therapy (Signed)
[facility], [place], [address] Phone: [phone]   Fax:  [phone]  Physical Therapy Treatment  Patient Details  Name: Christopher Salas MRN: [id number] Date of Birth: 1965-06-10 Referring Provider (PT): Ferne Reus, [place]   Encounter Date: 02/25/2018  PT End of Session - 02/25/18 1039    Visit Number  22    Number of Visits  24    Date for PT Re-Evaluation  03/04/18    Authorization Type  Progress note every 10th visit  To MD 03/04/18    PT Start Time  0954    PT Stop Time  1035    PT Time Calculation (min)  41 min    Activity Tolerance  Patient tolerated treatment well    Behavior During Therapy  [facility] for tasks assessed/performed       Past Medical History:  Diagnosis Date  . Hyperlipidemia   . Hypertension   . Seizures ([place])     Past Surgical History:  Procedure Laterality Date  . BACK SURGERY    . HAND SURGERY      There were no vitals filed for this visit.  Subjective Assessment - 02/25/18 1001    Subjective  Reports that he was alright until he just tried to stand with more WB through LLE to which he knee gave way.    Patient is accompained by:  Family member   Friend   Pertinent History  HTN    Limitations  Standing;Walking;House hold activities    How long can you stand comfortably?  5 minutes    How long can you walk comfortably?  10 minutes with walking stick    Diagnostic tests  MRI & X-Ray prior to surgery    Patient Stated Goals  decrease pain, stop falling    Currently in Pain?  Yes    Pain Location  Leg    Pain Orientation  Left    Pain Descriptors / Indicators  Discomfort    Pain Type  Surgical pain    Pain Onset  More than a month ago    Pain Frequency  Intermittent         [facility] PT Assessment - 02/25/18 0001      Assessment   Medical Diagnosis  Left leg weakness; lumbar radiculopathy    Referring Provider (PT)  Ferne Reus, PA-C    Onset Date/Surgical Date   10/30/17    Next MD Visit  03/04/18    Prior Therapy  no      Precautions   Precautions  Back    Precaution Comments  No lifting or bending      Restrictions   Weight Bearing Restrictions  No                   [facility] Adult PT Treatment/Exercise - 02/25/18 0001      Exercises   Exercises  Knee/Hip;Lumbar      Lumbar Exercises: Aerobic   Nustep  27mn L6 UE/LE activity, monitored      Knee/Hip Exercises: Machines for Strengthening   Cybex Knee Extension  10# 3x10 rep    Cybex Knee Flexion  30# 3x10 reps      Knee/Hip Exercises: Standing   Hip Flexion  AROM;Left;15 reps;Knee straight    Hip Abduction  AROM;Left;15 reps;Knee straight      Knee/Hip Exercises: Seated   Sit to Sand  10 reps;without UE support   LLE 75%, RLE 25% WB with R knee  extension     Knee/Hip Exercises: Supine   Hip Adduction Isometric  Strengthening;20 reps    Straight Leg Raises  Strengthening;Left;15 reps    Other Supine Knee/Hip Exercises  L hip clam green theraband x20 reps                  PT Long Term Goals - 01/15/18 1046      PT LONG TERM GOAL #1   Title  Patient will be independent with HEP and it's progression.    Period  Weeks    Status  On-going      PT LONG TERM GOAL #2   Title  Patient will demonstrate left knee AROM to 0-95 or greater to improve gait and stair ambulation.    Time  6    Period  Weeks    Status  Achieved      PT LONG TERM GOAL #3   Title  Patient will report ability to walk for 15 minutes with least restrictive AD and pain less than 5/10 in left LE.    Baseline  5-10 mins now 01-15-18    Time  6    Period  Weeks    Status  Partially Met      PT LONG TERM GOAL #4   Title  Patient will improve L LE strength to 3+/5 or greater to improve stability during functional tasks.    Baseline  --   NM 3/5 on hip ABD   Period  Weeks    Status  On-going            Plan - 02/25/18 1039    Clinical Impression Statement  Patient presented in  clinic with continued reports of L thigh numbness and hot temperate to proximal L quad. Patient conitnues to report L knee buckling as well with activities although he reports trying to do more around his home. More LE strengthening focused exercises completed today. Sit to stands completed today with LLE directly under his body and RLE extended forward more to emphasize greater WB through LLE. Patient able to complete but LLE instablity noted.    Rehab Potential  Fair    PT Frequency  2x / week    PT Duration  6 weeks    PT Treatment/Interventions  ADLs/Self Care Home Management;Gait training;Stair training;Neuromuscular re-education;Passive range of motion;Manual techniques;Moist Heat;Cryotherapy;Tax inspector;Therapeutic exercise;Therapeutic activities;Functional mobility training;Ultrasound;Patient/family education    PT Next Visit Plan  cont with POC for Nustep, LE strengthening, modalities PRN for pain relief.         PT Home Exercise Plan  see patient education section    Consulted and Agree with Plan of Care  Patient    Family Member Consulted  Friend       Patient will benefit from skilled therapeutic intervention in order to improve the following deficits and impairments:  Abnormal gait, Pain, Decreased balance, Decreased knowledge of precautions, Difficulty walking, Decreased strength, Decreased range of motion, Postural dysfunction, Decreased endurance, Decreased activity tolerance, Decreased safety awareness  Visit Diagnosis: Pain in left leg  Muscle weakness (generalized)  Difficulty in walking, not elsewhere classified  Low back pain, unspecified back pain laterality, unspecified chronicity, unspecified whether sciatica present     Problem List Patient Active Problem List   Diagnosis Date Noted  . Chronic hand pain, right 03/15/2016  . Carpal tunnel syndrome 09/22/2013  . Hypertension 07/19/2012  . Hyperlipidemia 07/19/2012   . Seizures ([place]) 07/19/2012    Standley Brooking,  PTA 02/25/2018, 10:47 AM  [facility] [place], [place], [address] Phone: [phone]   Fax:  [phone]  Name: Christopher Salas MRN: [id number] Date of Birth: 05-21-1965

## 2018-02-26 ENCOUNTER — Ambulatory Visit: Payer: BLUE CROSS/BLUE SHIELD | Admitting: *Deleted

## 2018-02-26 ENCOUNTER — Telehealth: Payer: Self-pay

## 2018-02-26 ENCOUNTER — Telehealth: Payer: Self-pay | Admitting: Family Medicine

## 2018-02-26 DIAGNOSIS — R262 Difficulty in walking, not elsewhere classified: Secondary | ICD-10-CM | POA: Diagnosis not present

## 2018-02-26 DIAGNOSIS — M545 Low back pain, unspecified: Secondary | ICD-10-CM

## 2018-02-26 DIAGNOSIS — M6281 Muscle weakness (generalized): Secondary | ICD-10-CM | POA: Diagnosis not present

## 2018-02-26 DIAGNOSIS — M79605 Pain in left leg: Secondary | ICD-10-CM

## 2018-02-26 MED ORDER — SILDENAFIL CITRATE 20 MG PO TABS: 20.0000 mg | ORAL_TABLET | ORAL | 1 refills | Status: DC | PRN

## 2018-02-26 NOTE — Telephone Encounter (Signed)
Pt aware insurance will only cover for above dx and advised he would have to pay out of pocket. Pt would like rx sent to [facility] in [place]. Rx sent as requested.

## 2018-02-26 NOTE — Telephone Encounter (Signed)
Insurance denied paying for Sildenafil  Only covered for Pulmonary HTN

## 2018-02-26 NOTE — Therapy (Addendum)
[facility], [place], [address] Phone: [phone]   Fax:  [phone]  Physical Therapy Treatment  PHYSICAL THERAPY DISCHARGE SUMMARY  Visits from Start of Care:23  Current functional level related to goals / functional outcomes: See below   Remaining deficits: See goals   Education / Equipment: HEP Plan: Patient agrees to discharge.  Patient goals were partially met. Patient is being discharged due to lack of progress.  ?????  Gabriela Eves, PT, DPT    Patient Details  Name: Christopher Salas MRN: [id number] Date of Birth: 02/19/1966 Referring Provider (PT): Ferne Reus, [place]   Encounter Date: 02/26/2018  PT End of Session - 02/26/18 1259    Visit Number  23    Number of Visits  24    Date for PT Re-Evaluation  03/04/18    Authorization Type  Progress note every 10th visit  To MD 03/04/18    PT Start Time  0945    PT Stop Time  1035    PT Time Calculation (min)  50 min       Past Medical History:  Diagnosis Date  . Hyperlipidemia   . Hypertension   . Seizures ([place])     Past Surgical History:  Procedure Laterality Date  . BACK SURGERY    . HAND SURGERY      There were no vitals filed for this visit.  Subjective Assessment - 02/26/18 1001    Subjective  LT knee  still feels week and gives away if I put to much weight on it.  To MD on 03-04-18    Patient is accompained by:  Family member    Pertinent History  HTN    Limitations  Standing;Walking;House hold activities    How long can you stand comfortably?  5 minutes    How long can you walk comfortably?  10 minutes with walking stick    Diagnostic tests  MRI & X-Ray prior to surgery    Patient Stated Goals  decrease pain, stop falling    Currently in Pain?  Yes    Pain Score  7     Pain Location  Leg    Pain Orientation  Left    Pain Descriptors / Indicators  Discomfort    Pain Type  Surgical pain    Pain Onset  More than a  month ago    Pain Frequency  Intermittent                       [facility] Adult PT Treatment/Exercise - 02/26/18 0001      Exercises   Exercises  Knee/Hip;Lumbar      Lumbar Exercises: Aerobic   Nustep  73mn L6 UE/LE activity, monitored      Lumbar Exercises: Supine   Straight Leg Raise  10 reps   AAROM     Knee/Hip Exercises: Machines for Strengthening   Cybex Knee Extension  10# 3x10 rep    Cybex Knee Flexion  30# 3x10 reps      Knee/Hip Exercises: Standing   Hip Flexion  AROM;Left;15 reps;Knee straight   very challenging   Hip Abduction  AROM;Left;15 reps;Knee straight      Knee/Hip Exercises: Seated   Sit to Sand  10 reps;without UE support      Knee/Hip Exercises: Supine   Hip Adduction Isometric  Strengthening;20 reps   ball at knees   Straight Leg Raises  Strengthening;Left;10 reps   AAROM  Other Supine Knee/Hip Exercises  L hip clam green theraband x20 reps                  PT Long Term Goals - 01/15/18 1046      PT LONG TERM GOAL #1   Title  Patient will be independent with HEP and it's progression.    Period  Weeks    Status  On-going      PT LONG TERM GOAL #2   Title  Patient will demonstrate left knee AROM to 0-95 or greater to improve gait and stair ambulation.    Time  6    Period  Weeks    Status  Achieved      PT LONG TERM GOAL #3   Title  Patient will report ability to walk for 15 minutes with least restrictive AD and pain less than 5/10 in left LE.    Baseline  5-10 mins now 01-15-18    Time  6    Period  Weeks    Status  Partially Met      PT LONG TERM GOAL #4   Title  Patient will improve L LE strength to 3+/5 or greater to improve stability during functional tasks.    Baseline  --   NM 3/5 on hip ABD   Period  Weeks    Status  On-going            Plan - 02/26/18 1004    Clinical Impression Statement  Pt arrived today doing fair. He continues to have complaints of LT LE buckling if he puts to much  weight on it. He continues to ambulate with a walking stick at this time. Pt was showing some progression with quad control and decreased buckling, but has regressed since falling a couple weeks ago. Rx's continue to focus on LT LE strengthening and control. Today he was unable to perform a SLR with LT LE without assistance and was unable to meet strength goal yet and is ongoing.    Clinical Presentation  Evolving    Rehab Potential  Fair    PT Frequency  2x / week    PT Duration  6 weeks    PT Treatment/Interventions  ADLs/Self Care Home Management;Gait training;Stair training;Neuromuscular re-education;Passive range of motion;Manual techniques;Moist Heat;Cryotherapy;Tax inspector;Therapeutic exercise;Therapeutic activities;Functional mobility training;Ultrasound;Patient/family education    PT Next Visit Plan  cont with POC for Nustep, LE strengthening, modalities PRN for pain relief.         PT Home Exercise Plan  see patient education section    Consulted and Agree with Plan of Care  Patient       Patient will benefit from skilled therapeutic intervention in order to improve the following deficits and impairments:  Abnormal gait, Pain, Decreased balance, Decreased knowledge of precautions, Difficulty walking, Decreased strength, Decreased range of motion, Postural dysfunction, Decreased endurance, Decreased activity tolerance, Decreased safety awareness  Visit Diagnosis: Pain in left leg  Muscle weakness (generalized)  Difficulty in walking, not elsewhere classified  Low back pain, unspecified back pain laterality, unspecified chronicity, unspecified whether sciatica present     Problem List Patient Active Problem List   Diagnosis Date Noted  . Chronic hand pain, right 03/15/2016  . Carpal tunnel syndrome 09/22/2013  . Hypertension 07/19/2012  . Hyperlipidemia 07/19/2012  . Seizures ([place]) 07/19/2012    Christopher Salas,Christopher Salas,  PTA 02/26/2018, 1:25 PM  [facility] [address] [place], [place], [address] Phone: [phone]  Fax:  [phone]  Name: Christopher Salas MRN: [id number] Date of Birth: 1966/01/15

## 2018-03-04 DIAGNOSIS — I10 Essential (primary) hypertension: Secondary | ICD-10-CM | POA: Diagnosis not present

## 2018-03-04 DIAGNOSIS — R29898 Other symptoms and signs involving the musculoskeletal system: Secondary | ICD-10-CM | POA: Diagnosis not present

## 2018-03-04 DIAGNOSIS — Z6836 Body mass index (BMI) 36.0-36.9, adult: Secondary | ICD-10-CM | POA: Diagnosis not present

## 2018-03-19 DIAGNOSIS — M5126 Other intervertebral disc displacement, lumbar region: Secondary | ICD-10-CM | POA: Diagnosis not present

## 2018-03-19 DIAGNOSIS — R29898 Other symptoms and signs involving the musculoskeletal system: Secondary | ICD-10-CM | POA: Diagnosis not present

## 2018-03-20 HISTORY — PX: BACK SURGERY: SHX140

## 2018-03-25 DIAGNOSIS — M5416 Radiculopathy, lumbar region: Secondary | ICD-10-CM | POA: Diagnosis not present

## 2018-05-06 ENCOUNTER — Telehealth: Payer: Self-pay | Admitting: Family Medicine

## 2018-05-06 DIAGNOSIS — R569 Unspecified convulsions: Secondary | ICD-10-CM

## 2018-05-06 DIAGNOSIS — E785 Hyperlipidemia, unspecified: Secondary | ICD-10-CM

## 2018-05-06 DIAGNOSIS — M5416 Radiculopathy, lumbar region: Secondary | ICD-10-CM | POA: Diagnosis not present

## 2018-05-06 MED ORDER — PHENOBARBITAL 100 MG PO TABS: 100.0000 mg | ORAL_TABLET | Freq: Every day | ORAL | 0 refills | Status: DC

## 2018-05-06 MED ORDER — ICOSAPENT ETHYL 1 G PO CAPS: 2.0000 g | ORAL_CAPSULE | Freq: Two times a day (BID) | ORAL | 11 refills | Status: DC

## 2018-05-06 MED ORDER — OMEGA-3-ACID ETHYL ESTERS 1 G PO CAPS: 2.0000 | ORAL_CAPSULE | Freq: Two times a day (BID) | ORAL | 3 refills | Status: DC

## 2018-05-06 MED ORDER — PHENOBARBITAL 16.2 MG PO TABS: 32.4000 mg | ORAL_TABLET | Freq: Every day | ORAL | 0 refills | Status: DC

## 2018-05-06 MED ORDER — SILDENAFIL CITRATE 20 MG PO TABS: 20.0000 mg | ORAL_TABLET | ORAL | 1 refills | Status: DC | PRN

## 2018-05-06 MED ORDER — NIACIN ER (ANTIHYPERLIPIDEMIC) 500 MG PO TBCR: 500.0000 mg | EXTENDED_RELEASE_TABLET | Freq: Every day | ORAL | 3 refills | Status: DC

## 2018-05-06 MED ORDER — FENOFIBRATE 48 MG PO TABS: 48.0000 mg | ORAL_TABLET | Freq: Every day | ORAL | 3 refills | Status: DC

## 2018-05-06 MED ORDER — OLMESARTAN MEDOXOMIL 20 MG PO TABS: 20.0000 mg | ORAL_TABLET | Freq: Every day | ORAL | 3 refills | Status: DC

## 2018-05-06 MED ORDER — ROSUVASTATIN CALCIUM 20 MG PO TABS: 20.0000 mg | ORAL_TABLET | Freq: Every day | ORAL | 3 refills | Status: DC

## 2018-05-07 NOTE — Telephone Encounter (Signed)
I sent medications for him

## 2018-05-07 NOTE — Telephone Encounter (Signed)
Left message - rx's sent to pharmacy.  

## 2018-10-27 ENCOUNTER — Other Ambulatory Visit: Payer: Self-pay | Admitting: Family Medicine

## 2018-10-27 DIAGNOSIS — R569 Unspecified convulsions: Secondary | ICD-10-CM

## 2018-10-28 NOTE — Telephone Encounter (Signed)
Last OV 01/31/18. Last RF 05/06/18. Next OV not sched

## 2018-11-04 DIAGNOSIS — M5416 Radiculopathy, lumbar region: Secondary | ICD-10-CM | POA: Diagnosis not present

## 2018-11-04 DIAGNOSIS — I10 Essential (primary) hypertension: Secondary | ICD-10-CM | POA: Diagnosis not present

## 2018-11-04 DIAGNOSIS — Z6836 Body mass index (BMI) 36.0-36.9, adult: Secondary | ICD-10-CM | POA: Diagnosis not present

## 2018-11-11 ENCOUNTER — Other Ambulatory Visit: Payer: Self-pay | Admitting: Family Medicine

## 2018-11-11 DIAGNOSIS — R569 Unspecified convulsions: Secondary | ICD-10-CM

## 2018-11-11 NOTE — Telephone Encounter (Signed)
PLEASE ADVISE.

## 2018-11-11 NOTE — Telephone Encounter (Signed)
I sent refill for patient, have him get on the schedule though so we can do a recheck before the next refill.

## 2018-11-11 NOTE — Telephone Encounter (Signed)
Patient aware of refill and need for appointment to be scheduled by voicemail.

## 2018-11-11 NOTE — Telephone Encounter (Signed)
What is the name of the medication? Phenbarbital   Have you contacted your pharmacy to request a refill? yes  Which pharmacy would you like this sent to? [facility]   Patient notified that their request is being sent to the clinical staff for review and that they should receive a call once it is complete. If they do not receive a call within 24 hours they can check with their pharmacy or our office.

## 2018-11-15 ENCOUNTER — Other Ambulatory Visit: Payer: Self-pay

## 2018-11-15 ENCOUNTER — Other Ambulatory Visit: Payer: BC Managed Care – PPO

## 2018-11-15 ENCOUNTER — Encounter: Payer: Self-pay | Admitting: Family Medicine

## 2018-11-15 ENCOUNTER — Ambulatory Visit (INDEPENDENT_AMBULATORY_CARE_PROVIDER_SITE_OTHER): Payer: BC Managed Care – PPO | Admitting: Family Medicine

## 2018-11-15 DIAGNOSIS — I10 Essential (primary) hypertension: Secondary | ICD-10-CM

## 2018-11-15 DIAGNOSIS — E785 Hyperlipidemia, unspecified: Secondary | ICD-10-CM

## 2018-11-15 DIAGNOSIS — R569 Unspecified convulsions: Secondary | ICD-10-CM

## 2018-11-15 MED ORDER — PHENOBARBITAL 100 MG PO TABS: 100.0000 mg | ORAL_TABLET | Freq: Every day | ORAL | 1 refills | Status: DC

## 2018-11-15 MED ORDER — PHENOBARBITAL 16.2 MG PO TABS: 32.4000 mg | ORAL_TABLET | Freq: Every day | ORAL | 1 refills | Status: DC

## 2018-11-15 NOTE — Progress Notes (Signed)
Virtual Visit via telephone Note  I connected with Christopher Salas on 11/15/18 at [place] by telephone and verified that I am speaking with the correct person using two identifiers. Christopher Salas is currently located at home and no other people are currently with her during visit. The provider, Fransisca Kaufmann Shanice Poznanski, MD is located in their office at time of visit.  Call ended at 0933  I discussed the limitations, risks, security and privacy concerns of performing an evaluation and management service by telephone and the availability of in person appointments. I also discussed with the patient that there may be a patient responsible charge related to this service. The patient expressed understanding and agreed to proceed.   History and Present Illness: siezure Patient is calling in for recheck on seizures and has not had a seizure in many years.    Hypertension Patient is currently on olmesartan, and their blood pressure today is 120/80. Patient denies any lightheadedness or dizziness. Patient denies headaches, blurred vision, chest pains, shortness of breath, or weakness. Denies any side effects from medication and is content with current medication.   Hyperlipidemia Patient is coming in for recheck of his hyperlipidemia. The patient is currently taking fenofibrate and niacin and fish oil and crestor. They deny any issues with myalgias or history of liver damage from it. They deny any focal numbness or weakness or chest pain.   No diagnosis found.  Outpatient Encounter Medications as of 11/15/2018  Medication Sig  . aspirin 81 MG tablet Take 81 mg by mouth daily.  Marland Kitchen Fe Bisgly-Succ-C-Thre-B12-FA (IRON-150 PO) Take 2 tablets by mouth daily.  . fenofibrate (TRICOR) 48 MG tablet Take 1 tablet (48 mg total) by mouth daily.  Marland Kitchen gabapentin (NEURONTIN) 400 MG capsule Take 400 mg by mouth 3 (three) times daily.  Marland Kitchen GABAPENTIN PO Take 600 mg by mouth.  Marland Kitchen ibuprofen (ADVIL,MOTRIN) 800 MG tablet Take 800 mg  by mouth every 8 (eight) hours as needed.  Vanessa Kick Ethyl (VASCEPA) 1 g CAPS Take 2 capsules (2 g total) by mouth 2 (two) times daily.  . meloxicam (MOBIC) 15 MG tablet Take 1 tablet (15 mg total) by mouth daily.  . niacin (NIASPAN) 500 MG CR tablet Take 1 tablet (500 mg total) by mouth daily with breakfast.  . olmesartan (BENICAR) 20 MG tablet Take 1 tablet (20 mg total) by mouth daily.  Marland Kitchen omega-3 acid ethyl esters (LOVAZA) 1 g capsule Take 2 capsules (2 g total) by mouth 2 (two) times daily.  Marland Kitchen PHENObarbital (LUMINAL) 100 MG tablet TAKE 1 TABLET BY MOUTH AT BEDTIME  . phenobarbital (LUMINAL) 16.2 MG tablet TAKE 2 TABLETS BY MOUTH AT BEDTIME  . rosuvastatin (CRESTOR) 20 MG tablet Take 1 tablet (20 mg total) by mouth daily.  . sildenafil (REVATIO) 20 MG tablet Take 1-4 tablets (20-80 mg total) by mouth as needed.   No facility-administered encounter medications on file as of 11/15/2018.     Review of Systems  Constitutional: Negative for chills and fever.  Respiratory: Negative for shortness of breath and wheezing.   Cardiovascular: Negative for chest pain and leg swelling.  Musculoskeletal: Positive for back pain. Negative for gait problem.  Skin: Negative for rash.  Neurological: Negative for dizziness, weakness and numbness.  All other systems reviewed and are negative.   Observations/Objective: Patient sounds comfortable and in no acute distess  Assessment and Plan: Problem List Items Addressed This Visit      Cardiovascular and Mediastinum   Hypertension -  Primary   Relevant Orders   CBC with Differential/Platelet   CMP14+EGFR     Other   Hyperlipidemia   Relevant Orders   Lipid panel   Seizures (HCC)   Relevant Medications   PHENObarbital (LUMINAL) 100 MG tablet   phenobarbital (LUMINAL) 16.2 MG tablet   Other Relevant Orders   CBC with Differential/Platelet       Follow Up Instructions: Follow up in htn and hld and siezure    I discussed the assessment  and treatment plan with the patient. The patient was provided an opportunity to ask questions and all were answered. The patient agreed with the plan and demonstrated an understanding of the instructions.   The patient was advised to call back or seek an in-person evaluation if the symptoms worsen or if the condition fails to improve as anticipated.  The above assessment and management plan was discussed with the patient. The patient verbalized understanding of and has agreed to the management plan. Patient is aware to call the clinic if symptoms persist or worsen. Patient is aware when to return to the clinic for a follow-up visit. Patient educated on when it is appropriate to go to the emergency department.    I provided 13 minutes of non-face-to-face time during this encounter.    Worthy Rancher, MD

## 2018-11-16 LAB — CMP14+EGFR
ALT: 40 IU/L (ref 0–44)
AST: 39 IU/L (ref 0–40)
Albumin/Globulin Ratio: 2.6 — ABNORMAL HIGH (ref 1.2–2.2)
Albumin: 5.1 g/dL — ABNORMAL HIGH (ref 3.8–4.9)
Alkaline Phosphatase: 127 IU/L — ABNORMAL HIGH (ref 39–117)
BUN/Creatinine Ratio: 13 (ref 9–20)
BUN: 14 mg/dL (ref 6–24)
Bilirubin Total: 0.3 mg/dL (ref 0.0–1.2)
CO2: 22 mmol/L (ref 20–29)
Calcium: 9.4 mg/dL (ref 8.7–10.2)
Chloride: 100 mmol/L (ref 96–106)
Creatinine, Ser: 1.05 mg/dL (ref 0.76–1.27)
GFR calc Af Amer: 92 mL/min/{1.73_m2} (ref >59)
GFR calc non Af Amer: 80 mL/min/{1.73_m2} (ref >59)
Globulin, Total: 2 g/dL (ref 1.5–4.5)
Glucose: 91 mg/dL (ref 65–99)
Potassium: 3.6 mmol/L (ref 3.5–5.2)
Sodium: 144 mmol/L (ref 134–144)
Total Protein: 7.1 g/dL (ref 6.0–8.5)

## 2018-11-16 LAB — CBC WITH DIFFERENTIAL/PLATELET
Basophils Absolute: 0 10*3/uL (ref 0.0–0.2)
Basos: 0 %
EOS (ABSOLUTE): 0.2 10*3/uL (ref 0.0–0.4)
Eos: 3 %
Hematocrit: 44.6 % (ref 37.5–51.0)
Hemoglobin: 15.7 g/dL (ref 13.0–17.7)
Immature Grans (Abs): 0 10*3/uL (ref 0.0–0.1)
Immature Granulocytes: 0 %
Lymphocytes Absolute: 2.6 10*3/uL (ref 0.7–3.1)
Lymphs: 41 %
MCH: 32.2 pg (ref 26.6–33.0)
MCHC: 35.2 g/dL (ref 31.5–35.7)
MCV: 91 fL (ref 79–97)
Monocytes Absolute: 0.4 10*3/uL (ref 0.1–0.9)
Monocytes: 6 %
Neutrophils Absolute: 3.2 10*3/uL (ref 1.4–7.0)
Neutrophils: 50 %
Platelets: 304 10*3/uL (ref 150–450)
RBC: 4.88 x10E6/uL (ref 4.14–5.80)
RDW: 13 % (ref 11.6–15.4)
WBC: 6.4 10*3/uL (ref 3.4–10.8)

## 2018-11-16 LAB — LIPID PANEL
Chol/HDL Ratio: 6.1 ratio — ABNORMAL HIGH (ref 0.0–5.0)
Cholesterol, Total: 206 mg/dL — ABNORMAL HIGH (ref 100–199)
HDL: 34 mg/dL — ABNORMAL LOW (ref >39)
Triglycerides: 670 mg/dL (ref 0–149)

## 2018-11-22 MED ORDER — FENOFIBRATE 48 MG PO TABS: 96.0000 mg | ORAL_TABLET | Freq: Every day | ORAL | 3 refills | Status: DC

## 2019-02-05 ENCOUNTER — Other Ambulatory Visit: Payer: Self-pay | Admitting: *Deleted

## 2019-02-05 MED ORDER — GABAPENTIN 400 MG PO CAPS: 400.0000 mg | ORAL_CAPSULE | Freq: Three times a day (TID) | ORAL | 1 refills | Status: DC

## 2019-02-05 NOTE — Telephone Encounter (Signed)
VM to refill 300 mg gabapentin Historical meds. Please advise. Next appt is 02/19/19

## 2019-02-05 NOTE — Telephone Encounter (Signed)
Please let patient know that I sent a refill of gabapentin.

## 2019-02-05 NOTE — Addendum Note (Signed)
Addended by: Caryl Pina on: 02/05/2019 02:58 PM   Modules accepted: Orders

## 2019-02-05 NOTE — Telephone Encounter (Signed)
Detailed message left for patient that rx sent to pharmacy.

## 2019-02-19 ENCOUNTER — Ambulatory Visit (INDEPENDENT_AMBULATORY_CARE_PROVIDER_SITE_OTHER): Payer: BC Managed Care – PPO | Admitting: Family Medicine

## 2019-02-19 NOTE — Progress Notes (Signed)
Attempted to call patient but no voicemail is set up Christopher Pina, MD [place] 02/19/2019, 1:19 PM

## 2019-02-25 ENCOUNTER — Ambulatory Visit (INDEPENDENT_AMBULATORY_CARE_PROVIDER_SITE_OTHER): Payer: BC Managed Care – PPO | Admitting: Family Medicine

## 2019-02-25 ENCOUNTER — Encounter: Payer: Self-pay | Admitting: Family Medicine

## 2019-02-25 DIAGNOSIS — E782 Mixed hyperlipidemia: Secondary | ICD-10-CM

## 2019-02-25 DIAGNOSIS — R569 Unspecified convulsions: Secondary | ICD-10-CM | POA: Diagnosis not present

## 2019-02-25 DIAGNOSIS — I10 Essential (primary) hypertension: Secondary | ICD-10-CM

## 2019-02-25 MED ORDER — PHENOBARBITAL 16.2 MG PO TABS: 32.4000 mg | ORAL_TABLET | Freq: Every day | ORAL | 1 refills | Status: DC

## 2019-02-25 MED ORDER — MELOXICAM 15 MG PO TABS: 15.0000 mg | ORAL_TABLET | Freq: Every day | ORAL | 0 refills | Status: DC

## 2019-02-25 MED ORDER — BACLOFEN 10 MG PO TABS: 10.0000 mg | ORAL_TABLET | Freq: Three times a day (TID) | ORAL | 5 refills | Status: DC

## 2019-02-25 MED ORDER — PHENOBARBITAL 100 MG PO TABS: 100.0000 mg | ORAL_TABLET | Freq: Every day | ORAL | 1 refills | Status: DC

## 2019-02-25 NOTE — Progress Notes (Signed)
Virtual Visit via telephone Note  I connected with Christopher Salas on 02/25/19 at 0829 by telephone and verified that I am speaking with the correct person using two identifiers. Christopher Salas is currently located at home and no other people are currently with her during visit. The provider, Fransisca Kaufmann Ashleigh Arya, MD is located in their office at time of visit.  Call ended at Christopher Salas  I discussed the limitations, risks, security and privacy concerns of performing an evaluation and management service by telephone and the availability of in person appointments. I also discussed with the patient that there may be a patient responsible charge related to this service. The patient expressed understanding and agreed to proceed.   History and Present Illness: Hypertension Patient is currently on olmesartan, and their blood pressure today is unknown. Patient denies any lightheadedness or dizziness. Patient denies headaches, blurred vision, chest pains, shortness of breath, or weakness. Denies any side effects from medication and is content with current medication.   Hyperlipidemia Patient is coming in for recheck of his hyperlipidemia. The patient is currently taking fenofibrate and crestor and niacin. They deny any issues with myalgias or history of liver damage from it. They deny any focal numbness or weakness or chest pain.   Seizures  Current rx- phenobarbital 100mg  qhs and 32.4(2x16.2mg ) mg qhs # meds rx- 90 day of both Effectiveness of current meds-no seizures Adverse reactions form meds-none  Pill count performed-No Last drug screen - n/a ( high risk q37m, moderate risk q31m, low risk yearly ) Urine drug screen today- No, virtual appt Was the [place] reviewed- yes  If yes were their any concerning findings? - none  No flowsheet data found.   Controlled substance contract signed on: n/a   No diagnosis found.  Outpatient Encounter Medications as of 02/25/2019  Medication Sig  . aspirin 81 MG  tablet Take 81 mg by mouth daily.  Marland Kitchen Fe Bisgly-Succ-C-Thre-B12-FA (IRON-150 PO) Take 2 tablets by mouth daily.  . fenofibrate (TRICOR) 48 MG tablet Take 2 tablets (96 mg total) by mouth daily.  Marland Kitchen gabapentin (NEURONTIN) 400 MG capsule Take 1 capsule (400 mg total) by mouth 3 (three) times daily.  Marland Kitchen ibuprofen (ADVIL,MOTRIN) 800 MG tablet Take 800 mg by mouth every 8 (eight) hours as needed.  . meloxicam (MOBIC) 15 MG tablet Take 1 tablet (15 mg total) by mouth daily.  . niacin (NIASPAN) 500 MG CR tablet Take 1 tablet (500 mg total) by mouth daily with breakfast.  . olmesartan (BENICAR) 20 MG tablet Take 1 tablet (20 mg total) by mouth daily.  Marland Kitchen omega-3 acid ethyl esters (LOVAZA) 1 g capsule Take 2 capsules (2 g total) by mouth 2 (two) times daily.  Marland Kitchen PHENObarbital (LUMINAL) 100 MG tablet Take 1 tablet (100 mg total) by mouth at bedtime.  . phenobarbital (LUMINAL) 16.2 MG tablet Take 2 tablets (32.4 mg total) by mouth at bedtime.  . rosuvastatin (CRESTOR) 20 MG tablet Take 1 tablet (20 mg total) by mouth daily.  . sildenafil (REVATIO) 20 MG tablet Take 1-4 tablets (20-80 mg total) by mouth as needed.   No facility-administered encounter medications on file as of 02/25/2019.     Review of Systems  Constitutional: Negative for chills and fever.  Respiratory: Negative for shortness of breath and wheezing.   Cardiovascular: Negative for chest pain and leg swelling.  Musculoskeletal: Positive for back pain. Negative for gait problem.  Skin: Negative for rash.  Neurological: Positive for numbness. Negative for dizziness,  weakness, light-headedness and headaches.  All other systems reviewed and are negative.   Observations/Objective: Patient sounds comfortable and in no acute distress  Assessment and Plan: Problem List Items Addressed This Visit      Cardiovascular and Mediastinum   Hypertension - Primary     Other   Hyperlipidemia   Seizures (HCC)   Relevant Medications   PHENObarbital  (LUMINAL) 100 MG tablet   phenobarbital (LUMINAL) 16.2 MG tablet      Continue current medications and see back in 6 months.  Follow Up Instructions:  Follow up in 6 months    I discussed the assessment and treatment plan with the patient. The patient was provided an opportunity to ask questions and all were answered. The patient agreed with the plan and demonstrated an understanding of the instructions.   The patient was advised to call back or seek an in-person evaluation if the symptoms worsen or if the condition fails to improve as anticipated.  The above assessment and management plan was discussed with the patient. The patient verbalized understanding of and has agreed to the management plan. Patient is aware to call the clinic if symptoms persist or worsen. Patient is aware when to return to the clinic for a follow-up visit. Patient educated on when it is appropriate to go to the emergency department.    I provided 16 minutes of non-face-to-face time during this encounter.    Christopher Rancher, MD

## 2019-03-21 HISTORY — PX: LAMINECTOMY AND MICRODISCECTOMY LUMBAR SPINE: SHX1913

## 2019-04-30 ENCOUNTER — Encounter: Payer: Self-pay | Admitting: Family Medicine

## 2019-04-30 ENCOUNTER — Ambulatory Visit (INDEPENDENT_AMBULATORY_CARE_PROVIDER_SITE_OTHER): Payer: BC Managed Care – PPO | Admitting: Family Medicine

## 2019-04-30 DIAGNOSIS — E861 Hypovolemia: Secondary | ICD-10-CM | POA: Diagnosis not present

## 2019-04-30 DIAGNOSIS — I9589 Other hypotension: Secondary | ICD-10-CM

## 2019-04-30 MED ORDER — OLMESARTAN MEDOXOMIL 5 MG PO TABS: 10.0000 mg | ORAL_TABLET | Freq: Every day | ORAL | 1 refills | Status: DC

## 2019-04-30 NOTE — Progress Notes (Signed)
Subjective:    Patient ID: Christopher Salas, male    DOB: Mar 16, 1966, 54 y.o.   MRN: [id number]   HPI: Christopher Salas is a 54 y.o. male presenting for dizziness when he stands up and walks around. Donated blood yesterday. BP was lowest he ever had at that time 109/69.  Felt hot and shaky. [place] against the door when he got home as a result.  Gets wobbly. Recently had food poison and then he forgot his phenobarbital. Hit his head on the floor 2 weeks ago. Denies LOC.    Depression screen [facility] 2/9 01/31/2018 08/21/2017 07/27/2017 04/16/2017 01/11/2017  Decreased Interest 0 0 2 0 0  Down, Depressed, Hopeless 0 0 0 0 0  PHQ - 2 Score 0 0 2 0 0  Altered sleeping - - 0 - -  Tired, decreased energy - - 2 - -  Change in appetite - - 0 - -  Feeling bad or failure about yourself  - - 0 - -  Trouble concentrating - - 0 - -  Moving slowly or fidgety/restless - - 0 - -  Suicidal thoughts - - 0 - -  PHQ-9 Score - - 4 - -  Difficult doing work/chores - - - - -     Relevant past medical, surgical, family and social history reviewed and updated as indicated.  Interim medical history since our last visit reviewed. Allergies and medications reviewed and updated.  ROS:  Review of Systems  Constitutional: Negative for fever.  Respiratory: Negative for shortness of breath.   Cardiovascular: Negative for chest pain.  Musculoskeletal: Negative for arthralgias.  Skin: Negative for rash.     Social History   Tobacco Use  Smoking Status Never Smoker  Smokeless Tobacco Never Used       Objective:     Wt Readings from Last 3 Encounters:  01/31/18 264 lb 6.4 oz (119.9 kg)  08/21/17 231 lb (104.8 kg)  07/27/17 236 lb (107 kg)     Exam deferred. Pt. Harboring due to COVID 19. Phone visit performed.   Assessment & Plan:   1. Hypotension due to hypovolemia     Meds ordered this encounter  Medications  . olmesartan (BENICAR) 5 MG tablet    Sig: Take 2 tablets (10 mg total) by mouth daily.   Dispense:  60 tablet    Refill:  1    Pt with multifactorial orthostasis. Most acutely related to the blood donation. Pt hydrates with [facility]. We discussed decreasing the olmesartan, hydrating with at least 4 bottles of water daily and consider putting off future blood donations if other health issues that can lead to dehydration are present. DC MTN Dew and sodas.    Diagnoses and all orders for this visit:  Hypotension due to hypovolemia  Other orders -     olmesartan (BENICAR) 5 MG tablet; Take 2 tablets (10 mg total) by mouth daily.    Virtual Visit via telephone Note  I discussed the limitations, risks, security and privacy concerns of performing an evaluation and management service by telephone and the availability of in person appointments. The patient was identified with two identifiers. Pt.expressed understanding and agreed to proceed. Pt. Is at home. Dr. Livia Snellen is in his office.  Follow Up Instructions:   I discussed the assessment and treatment plan with the patient. The patient was provided an opportunity to ask questions and all were answered. The patient agreed with the plan and demonstrated an understanding  of the instructions.   The patient was advised to call back or seek an in-person evaluation if the symptoms worsen or if the condition fails to improve as anticipated.   Total minutes including chart review and phone contact time: 26   Follow up plan: Return if symptoms worsen or fail to improve.  Claretta Fraise, MD [place]

## 2019-05-02 ENCOUNTER — Telehealth: Payer: Self-pay | Admitting: Family Medicine

## 2019-05-02 NOTE — Telephone Encounter (Signed)
Patients sister aware of recommendation.

## 2019-05-02 NOTE — Telephone Encounter (Signed)
Melatonin would be perfectly safe if he has not tried that, he could take 5 or 10 mg of the melatonin.

## 2019-05-02 NOTE — Telephone Encounter (Signed)
Wants to know what OTC medication is safe for patient to take with his prescriptions.

## 2019-05-22 ENCOUNTER — Other Ambulatory Visit: Payer: Self-pay

## 2019-05-26 ENCOUNTER — Other Ambulatory Visit: Payer: Self-pay

## 2019-05-26 ENCOUNTER — Ambulatory Visit (INDEPENDENT_AMBULATORY_CARE_PROVIDER_SITE_OTHER): Payer: BC Managed Care – PPO | Admitting: Family Medicine

## 2019-05-26 ENCOUNTER — Encounter: Payer: Self-pay | Admitting: Family Medicine

## 2019-05-26 VITALS — BP 120/80 | HR 87 | Temp 98.9°F | Ht 72.0 in | Wt 257.0 lb

## 2019-05-26 DIAGNOSIS — I10 Essential (primary) hypertension: Secondary | ICD-10-CM

## 2019-05-26 DIAGNOSIS — E782 Mixed hyperlipidemia: Secondary | ICD-10-CM | POA: Diagnosis not present

## 2019-05-26 DIAGNOSIS — E785 Hyperlipidemia, unspecified: Secondary | ICD-10-CM

## 2019-05-26 DIAGNOSIS — R569 Unspecified convulsions: Secondary | ICD-10-CM

## 2019-05-26 MED ORDER — PHENOBARBITAL 100 MG PO TABS: 100.0000 mg | ORAL_TABLET | Freq: Every day | ORAL | 1 refills | Status: DC

## 2019-05-26 MED ORDER — GABAPENTIN 400 MG PO CAPS: 400.0000 mg | ORAL_CAPSULE | Freq: Three times a day (TID) | ORAL | 1 refills | Status: DC

## 2019-05-26 MED ORDER — BACLOFEN 10 MG PO TABS: 10.0000 mg | ORAL_TABLET | Freq: Three times a day (TID) | ORAL | 5 refills | Status: DC

## 2019-05-26 MED ORDER — FENOFIBRATE 48 MG PO TABS: 96.0000 mg | ORAL_TABLET | Freq: Every day | ORAL | 3 refills | Status: DC

## 2019-05-26 MED ORDER — PHENOBARBITAL 16.2 MG PO TABS: 32.4000 mg | ORAL_TABLET | Freq: Every day | ORAL | 1 refills | Status: DC

## 2019-05-26 MED ORDER — ROSUVASTATIN CALCIUM 10 MG PO TABS: 10.0000 mg | ORAL_TABLET | Freq: Every day | ORAL | 3 refills | Status: DC

## 2019-05-26 MED ORDER — OMEGA-3-ACID ETHYL ESTERS 1 G PO CAPS: 2.0000 | ORAL_CAPSULE | Freq: Two times a day (BID) | ORAL | 3 refills | Status: DC

## 2019-05-26 MED ORDER — NIACIN ER (ANTIHYPERLIPIDEMIC) 500 MG PO TBCR: 500.0000 mg | EXTENDED_RELEASE_TABLET | Freq: Every day | ORAL | 3 refills | Status: DC

## 2019-05-26 MED ORDER — OLMESARTAN MEDOXOMIL 5 MG PO TABS: 10.0000 mg | ORAL_TABLET | Freq: Every day | ORAL | 1 refills | Status: DC

## 2019-05-26 NOTE — Progress Notes (Signed)
BP 120/80   Pulse 87   Temp 98.9 F (37.2 C)   Ht 6' (1.829 m)   Wt 257 lb (116.6 kg)   SpO2 98%   BMI 34.86 kg/m    Subjective:   Patient ID: Christopher Salas, male    DOB: 05/05/1965, 54 y.o.   MRN: [id number]  HPI: Christopher Salas is a 54 y.o. male presenting on 05/26/2019 for Medical Management of Chronic Issues and Hypertension   HPI Hypertension Patient is currently on olmesartan 5 mg, and their blood pressure today is 120/80. Patient denies any lightheadedness or dizziness. Patient denies headaches, blurred vision, chest pains, shortness of breath, or weakness. Denies any side effects from medication and is content with current medication.   Hyperlipidemia Patient is coming in for recheck of his hyperlipidemia. The patient is currently taking Crestor and fenofibrate. They deny any issues with myalgias or history of liver damage from it. They deny any focal numbness or weakness or chest pain.   Seizure disorder Current rx-phenobarbital, denies any seizures, takes 132.4 mg daily # meds rx-90-day supply of both Effectiveness of current meds-works well no seizures in quite some time Adverse reactions form meds-none  Pill count performed-No Last drug screen -N/A ( high risk q26m moderate risk q628mlow risk yearly ) Urine drug screen today- Yes Was the NCMat[place]oreviewed-yes  If yes were their any concerning findings? -None  No flowsheet data found.   Controlled substance contract signed on: Today  Relevant past medical, surgical, family and social history reviewed and updated as indicated. Interim medical history since our last visit reviewed. Allergies and medications reviewed and updated.  Review of Systems  Constitutional: Negative for chills and fever.  Eyes: Negative for visual disturbance.  Respiratory: Negative for shortness of breath and wheezing.   Cardiovascular: Negative for chest pain and leg swelling.  Musculoskeletal: Negative for back pain and gait problem.    Skin: Negative for rash.  Neurological: Negative for dizziness, seizures, weakness and light-headedness.  All other systems reviewed and are negative.   Per HPI unless specifically indicated above   Allergies as of 05/26/2019   No Known Allergies     Medication List       Accurate as of May 26, 2019  1:42 PM. If you have any questions, ask your nurse or doctor.        aspirin 81 MG tablet Take 81 mg by mouth daily.   baclofen 10 MG tablet Commonly known as: LIORESAL Take 1 tablet (10 mg total) by mouth 3 (three) times daily.   fenofibrate 48 MG tablet Commonly known as: Tricor Take 2 tablets (96 mg total) by mouth daily.   gabapentin 400 MG capsule Commonly known as: NEURONTIN Take 1 capsule (400 mg total) by mouth 3 (three) times daily.   ibuprofen 800 MG tablet Commonly known as: ADVIL Take 800 mg by mouth every 8 (eight) hours as needed.   IRON-150 PO Take 2 tablets by mouth daily.   meloxicam 15 MG tablet Commonly known as: MOBIC Take 1 tablet (15 mg total) by mouth daily.   niacin 500 MG CR tablet Commonly known as: NIASPAN Take 1 tablet (500 mg total) by mouth daily with breakfast.   olmesartan 5 MG tablet Commonly known as: BENICAR Take 2 tablets (10 mg total) by mouth daily.   omega-3 acid ethyl esters 1 g capsule Commonly known as: LOVAZA Take 2 capsules (2 g total) by mouth 2 (two) times daily.  PHENObarbital 100 MG tablet Commonly known as: LUMINAL Take 1 tablet (100 mg total) by mouth at bedtime.   phenobarbital 16.2 MG tablet Commonly known as: LUMINAL Take 2 tablets (32.4 mg total) by mouth at bedtime.   rosuvastatin 20 MG tablet Commonly known as: CRESTOR Take 1 tablet (20 mg total) by mouth daily. What changed:   how much to take  additional instructions   sildenafil 20 MG tablet Commonly known as: REVATIO Take 1-4 tablets (20-80 mg total) by mouth as needed.        Objective:   BP 120/80   Pulse 87   Temp 98.9 F  (37.2 C)   Ht 6' (1.829 m)   Wt 257 lb (116.6 kg)   SpO2 98%   BMI 34.86 kg/m   Wt Readings from Last 3 Encounters:  05/26/19 257 lb (116.6 kg)  01/31/18 264 lb 6.4 oz (119.9 kg)  08/21/17 231 lb (104.8 kg)    Physical Exam Vitals and nursing note reviewed.  Constitutional:      General: He is not in acute distress.    Appearance: He is well-developed. He is not diaphoretic.  Eyes:     General: No scleral icterus.    Conjunctiva/sclera: Conjunctivae normal.  Neck:     Thyroid: No thyromegaly.  Cardiovascular:     Rate and Rhythm: Normal rate and regular rhythm.     Heart sounds: Normal heart sounds. No murmur.  Pulmonary:     Effort: Pulmonary effort is normal. No respiratory distress.     Breath sounds: Normal breath sounds. No wheezing.  Musculoskeletal:        General: Normal range of motion.     Cervical back: Neck supple.  Lymphadenopathy:     Cervical: No cervical adenopathy.  Skin:    General: Skin is warm and dry.     Findings: No rash.  Neurological:     Mental Status: He is alert and oriented to person, place, and time.     Coordination: Coordination normal.  Psychiatric:        Behavior: Behavior normal.       Assessment & Plan:   Problem List Items Addressed This Visit      Cardiovascular and Mediastinum   Hypertension   Relevant Medications   omega-3 acid ethyl esters (LOVAZA) 1 g capsule   olmesartan (BENICAR) 5 MG tablet   niacin (NIASPAN) 500 MG CR tablet   fenofibrate (TRICOR) 48 MG tablet   rosuvastatin (CRESTOR) 10 MG tablet   Other Relevant Orders   CBC with Differential/Platelet   CMP14+EGFR     Other   Hyperlipidemia - Primary   Relevant Medications   omega-3 acid ethyl esters (LOVAZA) 1 g capsule   olmesartan (BENICAR) 5 MG tablet   niacin (NIASPAN) 500 MG CR tablet   fenofibrate (TRICOR) 48 MG tablet   rosuvastatin (CRESTOR) 10 MG tablet   Other Relevant Orders   Lipid panel   CBC with Differential/Platelet   CMP14+EGFR     Lipid panel   Seizures (HCC)   Relevant Medications   gabapentin (NEURONTIN) 400 MG capsule   PHENObarbital (LUMINAL) 100 MG tablet   phenobarbital (LUMINAL) 16.2 MG tablet   Other Relevant Orders   ToxASSURE Select 13 (MW), Urine      Do urine drug screen today, continue current medication, no major change.   Follow up plan: Return in about 6 months (around 11/26/2019), or if symptoms worsen or fail to improve, for Seizure and hypertension  cholesterol.  Counseling provided for all of the vaccine components No orders of the defined types were placed in this encounter.   Caryl Pina, MD [facility] 05/26/2019, 1:42 PM

## 2019-05-27 LAB — LIPID PANEL
Chol/HDL Ratio: 5.1 ratio — ABNORMAL HIGH (ref 0.0–5.0)
Cholesterol, Total: 225 mg/dL — ABNORMAL HIGH (ref 100–199)
HDL: 44 mg/dL (ref >39)
LDL Chol Calc (NIH): 108 mg/dL — ABNORMAL HIGH (ref 0–99)
Triglycerides: 426 mg/dL — ABNORMAL HIGH (ref 0–149)
VLDL Cholesterol Cal: 73 mg/dL — ABNORMAL HIGH (ref 5–40)

## 2019-05-27 LAB — CMP14+EGFR
ALT: 25 IU/L (ref 0–44)
AST: 35 IU/L (ref 0–40)
Albumin/Globulin Ratio: 2.3 — ABNORMAL HIGH (ref 1.2–2.2)
Albumin: 5.1 g/dL — ABNORMAL HIGH (ref 3.8–4.9)
Alkaline Phosphatase: 99 IU/L (ref 39–117)
BUN/Creatinine Ratio: 16 (ref 9–20)
BUN: 16 mg/dL (ref 6–24)
Bilirubin Total: 0.3 mg/dL (ref 0.0–1.2)
CO2: 23 mmol/L (ref 20–29)
Calcium: 10.2 mg/dL (ref 8.7–10.2)
Chloride: 105 mmol/L (ref 96–106)
Creatinine, Ser: 1.03 mg/dL (ref 0.76–1.27)
GFR calc Af Amer: 94 mL/min/{1.73_m2} (ref >59)
GFR calc non Af Amer: 81 mL/min/{1.73_m2} (ref >59)
Globulin, Total: 2.2 g/dL (ref 1.5–4.5)
Glucose: 99 mg/dL (ref 65–99)
Potassium: 4.3 mmol/L (ref 3.5–5.2)
Sodium: 142 mmol/L (ref 134–144)
Total Protein: 7.3 g/dL (ref 6.0–8.5)

## 2019-05-27 LAB — CBC WITH DIFFERENTIAL/PLATELET
Basophils Absolute: 0 10*3/uL (ref 0.0–0.2)
Basos: 0 %
EOS (ABSOLUTE): 0.2 10*3/uL (ref 0.0–0.4)
Eos: 4 %
Hematocrit: 39.3 % (ref 37.5–51.0)
Hemoglobin: 13.7 g/dL (ref 13.0–17.7)
Immature Grans (Abs): 0 10*3/uL (ref 0.0–0.1)
Immature Granulocytes: 0 %
Lymphocytes Absolute: 1.3 10*3/uL (ref 0.7–3.1)
Lymphs: 32 %
MCH: 33.2 pg — ABNORMAL HIGH (ref 26.6–33.0)
MCHC: 34.9 g/dL (ref 31.5–35.7)
MCV: 95 fL (ref 79–97)
Monocytes Absolute: 0.2 10*3/uL (ref 0.1–0.9)
Monocytes: 5 %
Neutrophils Absolute: 2.4 10*3/uL (ref 1.4–7.0)
Neutrophils: 59 %
Platelets: 233 10*3/uL (ref 150–450)
RBC: 4.13 x10E6/uL — ABNORMAL LOW (ref 4.14–5.80)
RDW: 13.5 % (ref 11.6–15.4)
WBC: 4 10*3/uL (ref 3.4–10.8)

## 2019-05-30 LAB — TOXASSURE SELECT 13 (MW), URINE

## 2019-08-11 ENCOUNTER — Other Ambulatory Visit: Payer: Self-pay | Admitting: Family Medicine

## 2019-09-01 ENCOUNTER — Encounter: Payer: Self-pay | Admitting: Family Medicine

## 2019-09-01 ENCOUNTER — Other Ambulatory Visit: Payer: Self-pay

## 2019-09-01 ENCOUNTER — Ambulatory Visit (INDEPENDENT_AMBULATORY_CARE_PROVIDER_SITE_OTHER): Payer: BC Managed Care – PPO | Admitting: Family Medicine

## 2019-09-01 VITALS — BP 143/96 | HR 91 | Temp 98.2°F | Ht 72.0 in | Wt 248.0 lb

## 2019-09-01 DIAGNOSIS — Z9889 Other specified postprocedural states: Secondary | ICD-10-CM | POA: Diagnosis not present

## 2019-09-01 DIAGNOSIS — M5442 Lumbago with sciatica, left side: Secondary | ICD-10-CM

## 2019-09-01 DIAGNOSIS — G8929 Other chronic pain: Secondary | ICD-10-CM

## 2019-09-01 MED ORDER — PREDNISONE 20 MG PO TABS: ORAL_TABLET | ORAL | 0 refills | Status: DC

## 2019-09-01 NOTE — Progress Notes (Signed)
BP (!) 143/96   Pulse 91   Temp 98.2 F (36.8 C)   Ht 6' (1.829 m)   Wt 248 lb (112.5 kg)   SpO2 96%   BMI 33.63 kg/m    Subjective:   Patient ID: Christopher Salas, male    DOB: [birth year], 54 y.o.   MRN: [id number]  HPI: Christopher Salas is a 54 y.o. male presenting on 09/01/2019 for Back Pain   HPI Patient is coming in today for back pain that is been recurrent and worsening.  He had a spinal surgery a few years ago and since then he has had some numbness going down his left leg but now he has more pain in the midline in the coming around the left side of his lower back and buttocks around to the front of his thigh on that left leg down to his knee and causing some numbness in that area as well.  He describes it as a shooting pain.  He says it has been worsening over the past few months.  He has been trying to do stretches and exercises and therapies at home and they are not helping at all.  He takes anti-inflammatories orally as well and they are just not cutting it any further.  He does have a history of a lumbar surgery where they cleaned of some spurs.  He does not have any hardware.  Relevant past medical, surgical, family and social history reviewed and updated as indicated. Interim medical history since our last visit reviewed. Allergies and medications reviewed and updated.  Review of Systems  Constitutional: Negative for chills and fever.  Eyes: Negative for visual disturbance.  Respiratory: Negative for shortness of breath and wheezing.   Cardiovascular: Negative for chest pain and leg swelling.  Musculoskeletal: Positive for arthralgias and back pain. Negative for gait problem.  Skin: Negative for rash.  Neurological: Positive for numbness. Negative for weakness.  All other systems reviewed and are negative.   Per HPI unless specifically indicated above   Allergies as of 09/01/2019   No Known Allergies     Medication List       Accurate as of September 01, 2019  4:51  PM. If you have any questions, ask your nurse or doctor.        aspirin 81 MG tablet Take 81 mg by mouth daily.   baclofen 10 MG tablet Commonly known as: LIORESAL Take 1 tablet (10 mg total) by mouth 3 (three) times daily.   fenofibrate 48 MG tablet Commonly known as: Tricor Take 2 tablets (96 mg total) by mouth daily.   gabapentin 400 MG capsule Commonly known as: NEURONTIN Take 1 capsule (400 mg total) by mouth 3 (three) times daily.   ibuprofen 800 MG tablet Commonly known as: ADVIL Take 800 mg by mouth every 8 (eight) hours as needed.   IRON-150 PO Take 2 tablets by mouth daily.   meloxicam 15 MG tablet Commonly known as: MOBIC Take 1 tablet (15 mg total) by mouth daily.   niacin 500 MG CR tablet Commonly known as: NIASPAN Take 1 tablet (500 mg total) by mouth daily with breakfast.   olmesartan 5 MG tablet Commonly known as: BENICAR Take 2 tablets (10 mg total) by mouth daily.   omega-3 acid ethyl esters 1 g capsule Commonly known as: LOVAZA Take 2 capsules (2 g total) by mouth 2 (two) times daily.   PHENObarbital 100 MG tablet Commonly known as: LUMINAL Take 1 tablet (100  mg total) by mouth at bedtime.   phenobarbital 16.2 MG tablet Commonly known as: LUMINAL Take 2 tablets (32.4 mg total) by mouth at bedtime.   predniSONE 20 MG tablet Commonly known as: DELTASONE 2 po at same time daily for 5 days Started by: Fransisca Kaufmann Bethanny Toelle, MD   rosuvastatin 10 MG tablet Commonly known as: CRESTOR Take 1 tablet (10 mg total) by mouth daily.   sildenafil 20 MG tablet Commonly known as: REVATIO TAKE 1 TO 4 TABLETS BY MOUTH ONCE DAILY AS NEEDED        Objective:   BP (!) 143/96   Pulse 91   Temp 98.2 F (36.8 C)   Ht 6' (1.829 m)   Wt 248 lb (112.5 kg)   SpO2 96%   BMI 33.63 kg/m   Wt Readings from Last 3 Encounters:  09/01/19 248 lb (112.5 kg)  05/26/19 257 lb (116.6 kg)  01/31/18 264 lb 6.4 oz (119.9 kg)    Physical Exam Vitals and nursing  note reviewed.  Constitutional:      General: He is not in acute distress.    Appearance: He is well-developed. He is not diaphoretic.  Eyes:     General: No scleral icterus.    Conjunctiva/sclera: Conjunctivae normal.  Neck:     Thyroid: No thyromegaly.  Musculoskeletal:        General: Normal range of motion.     Lumbar back: Tenderness present. Negative right straight leg raise test and negative left straight leg raise test.       Back:  Skin:    General: Skin is warm and dry.     Findings: No rash.  Neurological:     Mental Status: He is alert and oriented to person, place, and time.     Coordination: Coordination normal.  Psychiatric:        Behavior: Behavior normal.       Assessment & Plan:   Problem List Items Addressed This Visit    None    Visit Diagnoses    Chronic midline low back pain with left-sided sciatica    -  Primary   Relevant Medications   predniSONE (DELTASONE) 20 MG tablet   Other Relevant Orders   MR Lumbar Spine Wo Contrast   History of lumbar surgery       Relevant Medications   predniSONE (DELTASONE) 20 MG tablet   Other Relevant Orders   MR Lumbar Spine Wo Contrast      Instructed patient that he cannot take the prednisone until 2 weeks after his Covid vaccine which was just last week.  Also send him an MRI order to take a look at his back with his spinal surgery history and new symptoms and worsening symptoms and worsening pain. Follow up plan: Return if symptoms worsen or fail to improve.  Counseling provided for all of the vaccine components Orders Placed This Encounter  Procedures  . MR Lumbar Spine Wo Contrast    Caryl Pina, MD [facility] 09/01/2019, 4:51 PM

## 2019-09-13 ENCOUNTER — Other Ambulatory Visit: Payer: Self-pay | Admitting: Family Medicine

## 2019-09-15 NOTE — Telephone Encounter (Signed)
rx refill-not due for follow up till 12/16/19  Refills sent in

## 2019-10-09 ENCOUNTER — Telehealth: Payer: Self-pay | Admitting: Family Medicine

## 2019-10-09 NOTE — Telephone Encounter (Signed)
Pt checking status to see if the MRI of his back was approved by his insurance or if the office has any update on the MRI being scheduled.

## 2019-10-10 NOTE — Telephone Encounter (Signed)
Made appt with [facility] called patient to notify - [facility]

## 2019-11-03 ENCOUNTER — Ambulatory Visit
Admission: RE | Admit: 2019-11-03 | Discharge: 2019-11-03 | Disposition: A | Discharge status: Home / Self Care | Payer: BC Managed Care – PPO | Source: Ambulatory Visit | Attending: Family Medicine | Admitting: Family Medicine

## 2019-11-03 ENCOUNTER — Other Ambulatory Visit: Payer: Self-pay

## 2019-11-03 DIAGNOSIS — M48061 Spinal stenosis, lumbar region without neurogenic claudication: Secondary | ICD-10-CM | POA: Diagnosis not present

## 2019-11-03 DIAGNOSIS — G8929 Other chronic pain: Secondary | ICD-10-CM

## 2019-11-03 DIAGNOSIS — Z9889 Other specified postprocedural states: Secondary | ICD-10-CM

## 2019-11-05 ENCOUNTER — Telehealth: Payer: Self-pay | Admitting: Family Medicine

## 2019-11-05 ENCOUNTER — Other Ambulatory Visit: Payer: Self-pay | Admitting: Family Medicine

## 2019-11-05 DIAGNOSIS — M5126 Other intervertebral disc displacement, lumbar region: Secondary | ICD-10-CM

## 2019-11-05 DIAGNOSIS — M5136 Other intervertebral disc degeneration, lumbar region: Secondary | ICD-10-CM

## 2019-11-05 NOTE — Telephone Encounter (Signed)
-----   Message from Ladean Raya, [place] sent at 11/05/2019 12:13 PM EDT ----- Labs called to patient.  Verbalized understanding. Patient would like referral to Dr. Moss Mc

## 2019-11-05 NOTE — Telephone Encounter (Signed)
Would you be able to place the referral prior to his appt 11/26/19 or does he need to wait till then?

## 2019-11-05 NOTE — Telephone Encounter (Signed)
We can go ahead and place a referral beforehand, is there a neurosurgeon that he has preference to?

## 2019-11-05 NOTE — Telephone Encounter (Signed)
Please see result notes regarding this, will close encounter.

## 2019-11-05 NOTE — Telephone Encounter (Signed)
Placed referral to neurosurgery

## 2019-11-26 ENCOUNTER — Other Ambulatory Visit: Payer: Self-pay

## 2019-11-26 ENCOUNTER — Encounter: Payer: Self-pay | Admitting: Family Medicine

## 2019-11-26 ENCOUNTER — Ambulatory Visit (INDEPENDENT_AMBULATORY_CARE_PROVIDER_SITE_OTHER): Payer: BC Managed Care – PPO | Admitting: Family Medicine

## 2019-11-26 VITALS — BP 143/88 | HR 78 | Temp 98.2°F | Ht 72.0 in | Wt 257.0 lb

## 2019-11-26 DIAGNOSIS — R569 Unspecified convulsions: Secondary | ICD-10-CM | POA: Diagnosis not present

## 2019-11-26 DIAGNOSIS — I447 Left bundle-branch block, unspecified: Secondary | ICD-10-CM

## 2019-11-26 DIAGNOSIS — I10 Essential (primary) hypertension: Secondary | ICD-10-CM | POA: Diagnosis not present

## 2019-11-26 DIAGNOSIS — M5416 Radiculopathy, lumbar region: Secondary | ICD-10-CM | POA: Diagnosis not present

## 2019-11-26 DIAGNOSIS — E785 Hyperlipidemia, unspecified: Secondary | ICD-10-CM

## 2019-11-26 MED ORDER — SILDENAFIL CITRATE 20 MG PO TABS: ORAL_TABLET | ORAL | 3 refills | Status: DC

## 2019-11-26 MED ORDER — PHENOBARBITAL 100 MG PO TABS: 100.0000 mg | ORAL_TABLET | Freq: Every day | ORAL | 1 refills | Status: DC

## 2019-11-26 MED ORDER — PHENOBARBITAL 16.2 MG PO TABS: 32.4000 mg | ORAL_TABLET | Freq: Every day | ORAL | 1 refills | Status: DC

## 2019-11-26 MED ORDER — OLMESARTAN MEDOXOMIL 5 MG PO TABS: 10.0000 mg | ORAL_TABLET | Freq: Every day | ORAL | 3 refills | Status: DC

## 2019-11-26 MED ORDER — MELOXICAM 15 MG PO TABS: 15.0000 mg | ORAL_TABLET | Freq: Every day | ORAL | 3 refills | Status: DC

## 2019-11-26 MED ORDER — FENOFIBRATE 48 MG PO TABS: 96.0000 mg | ORAL_TABLET | Freq: Every day | ORAL | 3 refills | Status: DC

## 2019-11-26 MED ORDER — GABAPENTIN 400 MG PO CAPS: 400.0000 mg | ORAL_CAPSULE | Freq: Three times a day (TID) | ORAL | 3 refills | Status: DC

## 2019-11-26 MED ORDER — NIACIN ER (ANTIHYPERLIPIDEMIC) 500 MG PO TBCR: 500.0000 mg | EXTENDED_RELEASE_TABLET | Freq: Every day | ORAL | 3 refills | Status: DC

## 2019-11-26 MED ORDER — ROSUVASTATIN CALCIUM 10 MG PO TABS: 10.0000 mg | ORAL_TABLET | Freq: Every day | ORAL | 3 refills | Status: DC

## 2019-11-26 MED ORDER — BACLOFEN 10 MG PO TABS: 10.0000 mg | ORAL_TABLET | Freq: Three times a day (TID) | ORAL | 5 refills | Status: DC

## 2019-11-26 NOTE — Addendum Note (Signed)
Addended by: Caryl Pina on: 11/26/2019 02:09 PM   Modules accepted: Orders

## 2019-11-26 NOTE — Progress Notes (Addendum)
BP (!) 143/88   Pulse 78   Temp 98.2 F (36.8 C)   Ht 6' (1.829 m)   Wt 257 lb (116.6 kg)   SpO2 97%   BMI 34.86 kg/m    Subjective:   Patient ID: Christopher Salas, male    DOB: 1965/05/19, 54 y.o.   MRN: [id number]  HPI: Christopher Salas is a 54 y.o. male presenting on 11/26/2019 for Medical Management of Chronic Issues, Hypertension, Hyperlipidemia, and Seizures   HPI Hypertension Patient is currently on olmesartan, and their blood pressure today is 143/88. Patient denies any lightheadedness or dizziness. Patient denies headaches, blurred vision, chest pains, shortness of breath, or weakness. Denies any side effects from medication and is content with current medication.   Hyperlipidemia Patient is coming in for recheck of his hyperlipidemia. The patient is currently taking fish oil and Crestor and fenofibrate. They deny any issues with myalgias or history of liver damage from it. They deny any focal numbness or weakness or chest pain.   Seizures Current rx-phenobarbital 100 mg +32.4 daily # meds rx-30- 100 mg tablets and 60 16.2 mg tablets Effectiveness of current meds-works well, has not had a seizure in years Adverse reactions form meds-none  Pill count performed-No Last drug screen -06/05/2019 ( high risk q48m moderate risk q626mlow risk yearly ) Urine drug screen today- No Was the NCSibleyeviewed-yes  If yes were their any concerning findings? -None  No flowsheet data found.   Controlled substance contract signed on: 06/05/2019  Relevant past medical, surgical, family and social history reviewed and updated as indicated. Interim medical history since our last visit reviewed. Allergies and medications reviewed and updated.  Review of Systems  Constitutional: Negative for chills and fever.  Eyes: Negative for visual disturbance.  Respiratory: Negative for shortness of breath and wheezing.   Cardiovascular: Negative for chest pain and leg swelling.  Musculoskeletal:  Positive for back pain. Negative for gait problem.  Skin: Negative for rash.  Neurological: Positive for weakness and numbness. Negative for dizziness and light-headedness.  All other systems reviewed and are negative.   Per HPI unless specifically indicated above   Allergies as of 11/26/2019   No Known Allergies     Medication List       Accurate as of November 26, 2019  1:47 PM. If you have any questions, ask your nurse or doctor.        aspirin 81 MG tablet Take 81 mg by mouth daily.   baclofen 10 MG tablet Commonly known as: LIORESAL Take 1 tablet (10 mg total) by mouth 3 (three) times daily.   fenofibrate 48 MG tablet Commonly known as: Tricor Take 2 tablets (96 mg total) by mouth daily.   gabapentin 400 MG capsule Commonly known as: NEURONTIN Take 1 capsule (400 mg total) by mouth 3 (three) times daily.   ibuprofen 800 MG tablet Commonly known as: ADVIL Take 800 mg by mouth every 8 (eight) hours as needed.   IRON-150 PO Take 2 tablets by mouth daily.   meloxicam 15 MG tablet Commonly known as: MOBIC Take 1 tablet (15 mg total) by mouth daily.   niacin 500 MG CR tablet Commonly known as: NIASPAN Take 1 tablet (500 mg total) by mouth daily with breakfast.   olmesartan 5 MG tablet Commonly known as: BENICAR Take 2 tablets (10 mg total) by mouth daily.   omega-3 acid ethyl esters 1 g capsule Commonly known as: LOVAZA Take 2 capsules (2 g  total) by mouth 2 (two) times daily.   PHENObarbital 100 MG tablet Commonly known as: LUMINAL Take 1 tablet (100 mg total) by mouth at bedtime.   phenobarbital 16.2 MG tablet Commonly known as: LUMINAL Take 2 tablets (32.4 mg total) by mouth at bedtime.   predniSONE 20 MG tablet Commonly known as: DELTASONE 2 po at same time daily for 5 days   rosuvastatin 10 MG tablet Commonly known as: CRESTOR Take 1 tablet (10 mg total) by mouth daily.   sildenafil 20 MG tablet Commonly known as: REVATIO TAKE 1 TO 4  TABLETS BY MOUTH ONCE DAILY AS NEEDED        Objective:   BP (!) 143/88   Pulse 78   Temp 98.2 F (36.8 C)   Ht 6' (1.829 m)   Wt 257 lb (116.6 kg)   SpO2 97%   BMI 34.86 kg/m   Wt Readings from Last 3 Encounters:  11/26/19 257 lb (116.6 kg)  09/01/19 248 lb (112.5 kg)  05/26/19 257 lb (116.6 kg)    Physical Exam Vitals and nursing note reviewed.  Constitutional:      General: He is not in acute distress.    Appearance: He is well-developed. He is not diaphoretic.  Eyes:     General: No scleral icterus.    Conjunctiva/sclera: Conjunctivae normal.  Neck:     Thyroid: No thyromegaly.  Cardiovascular:     Rate and Rhythm: Normal rate and regular rhythm.     Heart sounds: Normal heart sounds. No murmur heard.   Pulmonary:     Effort: Pulmonary effort is normal. No respiratory distress.     Breath sounds: Normal breath sounds. No wheezing.  Musculoskeletal:        General: Normal range of motion.     Cervical back: Neck supple.  Lymphadenopathy:     Cervical: No cervical adenopathy.  Skin:    General: Skin is warm and dry.     Findings: No rash.  Neurological:     Mental Status: He is alert and oriented to person, place, and time.     Coordination: Coordination normal.  Psychiatric:        Behavior: Behavior normal.    EKG: LBB block with possible old infarct, with LVH Assessment & Plan:   Problem List Items Addressed This Visit      Cardiovascular and Mediastinum   Hypertension - Primary   Relevant Medications   fenofibrate (TRICOR) 48 MG tablet   niacin (NIASPAN) 500 MG CR tablet   olmesartan (BENICAR) 5 MG tablet   rosuvastatin (CRESTOR) 10 MG tablet   sildenafil (REVATIO) 20 MG tablet   Other Relevant Orders   CMP14+EGFR     Other   Hyperlipidemia   Relevant Medications   fenofibrate (TRICOR) 48 MG tablet   niacin (NIASPAN) 500 MG CR tablet   olmesartan (BENICAR) 5 MG tablet   rosuvastatin (CRESTOR) 10 MG tablet   sildenafil (REVATIO) 20 MG  tablet   Other Relevant Orders   CBC with Differential/Platelet   Lipid panel   Seizures (HCC)   Relevant Medications   gabapentin (NEURONTIN) 400 MG capsule   PHENObarbital (LUMINAL) 100 MG tablet   phenobarbital (LUMINAL) 16.2 MG tablet   Other Relevant Orders   CBC with Differential/Platelet      Continue current medication, no changes.  EKG shows possible new left bundle branch block, will refer to cardiology for presurgical work-up. Follow up plan: Return in about 6 months (around 05/25/2020),  or if symptoms worsen or fail to improve, for Hypertension hyperlipidemia.  Counseling provided for all of the vaccine components Orders Placed This Encounter  Procedures  . CBC with Differential/Platelet  . CMP14+EGFR  . Lipid panel    Caryl Pina, MD [facility] 11/26/2019, 1:47 PM

## 2019-11-27 DIAGNOSIS — M5416 Radiculopathy, lumbar region: Secondary | ICD-10-CM | POA: Insufficient documentation

## 2019-11-27 LAB — CBC WITH DIFFERENTIAL/PLATELET
Basophils Absolute: 0 10*3/uL (ref 0.0–0.2)
Basos: 1 %
EOS (ABSOLUTE): 0.1 10*3/uL (ref 0.0–0.4)
Eos: 3 %
Hematocrit: 44.1 % (ref 37.5–51.0)
Hemoglobin: 15.6 g/dL (ref 13.0–17.7)
Immature Grans (Abs): 0 10*3/uL (ref 0.0–0.1)
Immature Granulocytes: 0 %
Lymphocytes Absolute: 1.7 10*3/uL (ref 0.7–3.1)
Lymphs: 37 %
MCH: 32.8 pg (ref 26.6–33.0)
MCHC: 35.4 g/dL (ref 31.5–35.7)
MCV: 93 fL (ref 79–97)
Monocytes Absolute: 0.3 10*3/uL (ref 0.1–0.9)
Monocytes: 7 %
Neutrophils Absolute: 2.3 10*3/uL (ref 1.4–7.0)
Neutrophils: 52 %
Platelets: 240 10*3/uL (ref 150–450)
RBC: 4.76 x10E6/uL (ref 4.14–5.80)
RDW: 11.6 % (ref 11.6–15.4)
WBC: 4.4 10*3/uL (ref 3.4–10.8)

## 2019-11-27 LAB — CMP14+EGFR
ALT: 32 IU/L (ref 0–44)
AST: 41 IU/L — ABNORMAL HIGH (ref 0–40)
Albumin/Globulin Ratio: 1.7 (ref 1.2–2.2)
Albumin: 4.7 g/dL (ref 3.8–4.9)
Alkaline Phosphatase: 99 IU/L (ref 48–121)
BUN/Creatinine Ratio: 13 (ref 9–20)
BUN: 13 mg/dL (ref 6–24)
Bilirubin Total: 0.2 mg/dL (ref 0.0–1.2)
CO2: 25 mmol/L (ref 20–29)
Calcium: 9.7 mg/dL (ref 8.7–10.2)
Chloride: 102 mmol/L (ref 96–106)
Creatinine, Ser: 0.98 mg/dL (ref 0.76–1.27)
GFR calc Af Amer: 99 mL/min/{1.73_m2} (ref >59)
GFR calc non Af Amer: 86 mL/min/{1.73_m2} (ref >59)
Globulin, Total: 2.7 g/dL (ref 1.5–4.5)
Glucose: 106 mg/dL — ABNORMAL HIGH (ref 65–99)
Potassium: 4.1 mmol/L (ref 3.5–5.2)
Sodium: 141 mmol/L (ref 134–144)
Total Protein: 7.4 g/dL (ref 6.0–8.5)

## 2019-11-27 LAB — LIPID PANEL
Chol/HDL Ratio: 4.3 ratio (ref 0.0–5.0)
Cholesterol, Total: 189 mg/dL (ref 100–199)
HDL: 44 mg/dL (ref >39)
LDL Chol Calc (NIH): 97 mg/dL (ref 0–99)
Triglycerides: 288 mg/dL — ABNORMAL HIGH (ref 0–149)
VLDL Cholesterol Cal: 48 mg/dL — ABNORMAL HIGH (ref 5–40)

## 2019-12-24 DIAGNOSIS — Z6834 Body mass index (BMI) 34.0-34.9, adult: Secondary | ICD-10-CM | POA: Diagnosis not present

## 2019-12-24 DIAGNOSIS — M5126 Other intervertebral disc displacement, lumbar region: Secondary | ICD-10-CM | POA: Insufficient documentation

## 2019-12-24 DIAGNOSIS — M5416 Radiculopathy, lumbar region: Secondary | ICD-10-CM | POA: Diagnosis not present

## 2019-12-24 DIAGNOSIS — I10 Essential (primary) hypertension: Secondary | ICD-10-CM | POA: Diagnosis not present

## 2020-02-02 DIAGNOSIS — I447 Left bundle-branch block, unspecified: Secondary | ICD-10-CM | POA: Insufficient documentation

## 2020-02-02 DIAGNOSIS — M5126 Other intervertebral disc displacement, lumbar region: Secondary | ICD-10-CM | POA: Diagnosis not present

## 2020-02-02 NOTE — Progress Notes (Signed)
Cardiology Office Note   Date:  02/04/2020   ID:  Christopher Salas, DOB June 01, 1965, MRN [id number]  PCP:  Christopher Salas, Christopher Kaufmann, MD  Cardiologist:   Minus Breeding, MD Referring: Christopher Salas, Christopher Kaufmann, MD  Chief Complaint  Patient presents with   Abnormal ECG      History of Present Illness: Christopher Salas is a 54 y.o. male who presents for evaluation of LBBB.  He was referred by  Christopher Salas, Christopher Kaufmann, MD.  He has no past cardiac history.    He was recently found incidentally to have a left bundle branch block.  He has never had any prior cardiac work-up.  He has had chest discomfort he says.  He says it feels like reflux but it does not have any association with food.  This happens at rest.  Its not new onset.  He cannot really quantify or qualify it.  It comes on spontaneously and goes away spontaneously.  He does have some mild shortness of breath not necessarily associated.  He does not have any presyncope or syncope.  He does not really have palpitations.  His only episode of syncope happened around the time he gave some blood and his blood pressure was running low.  He said this was a few months ago and he actually had his blood pressure medicines reduced.  He is unable to be particularly physically active right now because he has back pain and he is walking with a stick.  He might need to have surgery.   Past Medical History:  Diagnosis Date   Hyperlipidemia    Hypertension    Seizures ([place])     Past Surgical History:  Procedure Laterality Date   BACK SURGERY     HAND SURGERY       Current Outpatient Medications  Medication Sig Dispense Refill   aspirin 81 MG tablet Take 81 mg by mouth daily.     baclofen (LIORESAL) 10 MG tablet Take 1 tablet (10 mg total) by mouth 3 (three) times daily. 30 each 5   Fe Bisgly-Succ-C-Thre-B12-FA (IRON-150 PO) Take 2 tablets by mouth daily.     fenofibrate (TRICOR) 48 MG tablet Take 2 tablets (96 mg total) by mouth daily. 90 tablet 3    gabapentin (NEURONTIN) 400 MG capsule Take 1 capsule (400 mg total) by mouth 3 (three) times daily. 270 capsule 3   ibuprofen (ADVIL,MOTRIN) 800 MG tablet Take 800 mg by mouth every 8 (eight) hours as needed.     meloxicam (MOBIC) 15 MG tablet Take 1 tablet (15 mg total) by mouth daily. 90 tablet 3   niacin (NIASPAN) 500 MG CR tablet Take 1 tablet (500 mg total) by mouth daily with breakfast. 90 tablet 3   olmesartan (BENICAR) 5 MG tablet Take 2 tablets (10 mg total) by mouth daily. 180 tablet 3   omega-3 acid ethyl esters (LOVAZA) 1 g capsule Take 2 capsules (2 g total) by mouth 2 (two) times daily. 360 capsule 3   PHENObarbital (LUMINAL) 100 MG tablet Take 1 tablet (100 mg total) by mouth at bedtime. 90 tablet 1   phenobarbital (LUMINAL) 16.2 MG tablet Take 2 tablets (32.4 mg total) by mouth at bedtime. 180 tablet 1   rosuvastatin (CRESTOR) 10 MG tablet Take 1 tablet (10 mg total) by mouth daily. 90 tablet 3   sildenafil (REVATIO) 20 MG tablet TAKE 1 TO 4 TABLETS BY MOUTH ONCE DAILY AS NEEDED 30 tablet 3   No current facility-administered  medications for this visit.    Allergies:   Patient has no known allergies.    Social History:  The patient  reports that he has never smoked. He has never used smokeless tobacco. He reports that he does not drink alcohol and does not use drugs.   Family History:  The patient's family history includes CAD (age of onset: 40) in his father; Diabetes in his father; Emphysema in his mother.    ROS:  Please see the history of present illness.   Otherwise, review of systems are positive for none.   All other systems are reviewed and negative.    PHYSICAL EXAM: VS:  BP (!) 140/100    Pulse 78    Ht 6' (1.829 m)    Wt 259 lb (117.5 kg)    BMI 35.13 kg/m  , BMI Body mass index is 35.13 kg/m. GENERAL:  Well appearing HEENT:  Pupils equal round and reactive, fundi not visualized, oral mucosa unremarkable NECK:  No jugular venous distention,  waveform within normal limits, carotid upstroke brisk and symmetric, no bruits, no thyromegaly LYMPHATICS:  No cervical, inguinal adenopathy LUNGS:  Clear to auscultation bilaterally BACK:  No CVA tenderness CHEST:  Unremarkable HEART:  PMI not displaced or sustained,S1 and S2 within normal limits, no S3, no S4, no clicks, no rubs, no murmurs ABD:  Flat, positive bowel sounds normal in frequency in pitch, no bruits, no rebound, no guarding, no midline pulsatile mass, no hepatomegaly, no splenomegaly EXT:  2 plus pulses throughout, no edema, no cyanosis no clubbing SKIN:  No rashes no nodules NEURO:  Cranial nerves II through XII grossly intact, motor grossly intact throughout PSYCH:  Cognitively intact, oriented to person place and time    EKG:  EKG is not ordered today. The ekg ordered 11/26/2019 demonstrates sinus rhythm, left bundle branch block, rate 77, left axis deviation.  No old EKG for comparison.   Recent Labs: 11/26/2019: ALT 32; BUN 13; Creatinine, Ser 0.98; Hemoglobin 15.6; Platelets 240; Potassium 4.1; Sodium 141    Lipid Panel    Component Value Date/Time   CHOL 189 11/26/2019 1419   CHOL 123 07/19/2012 1547   TRIG 288 (H) 11/26/2019 1419   TRIG 252 (H) 04/27/2014 1739   TRIG 220 (H) 07/19/2012 1547   HDL 44 11/26/2019 1419   HDL 39 (L) 04/27/2014 1739   HDL 36 (L) 07/19/2012 1547   CHOLHDL 4.3 11/26/2019 1419   LDLCALC 97 11/26/2019 1419   LDLCALC 44 07/25/2013 1657   LDLCALC 43 07/19/2012 1547      Wt Readings from Last 3 Encounters:  02/04/20 259 lb (117.5 kg)  11/26/19 257 lb (116.6 kg)  09/01/19 248 lb (112.5 kg)      Other studies Reviewed: Additional studies/ records that were reviewed today include: Labs. Review of the above records demonstrates:  Please see elsewhere in the note.     ASSESSMENT AND PLAN:  LBBB:   This is new.  I am going to check an echocardiogram.  I will be evaluating with a perfusion study as below.  If these are normal  then no further work-up.  CHEST PAIN:    The patient has some chest discomfort that sounds not anginal but he has very significant cardiovascular risk factors with an abnormal EKG.  I cannot screen him with a POET (Plain Old Exercise Treadmill) because he would not be able to walk on a treadmill.  Therefore, he needs a Lexicographer.  DYSLIPIDEMIA: LDL was  mildly elevated at 97.  I will defer management to his primary care physician.  HTN: His blood pressure is elevated but he says is actually been running low and he has been hypotensive recently.  He should keep a blood pressure diary.  No increasing medications at this point as they have recently been decreased.  He needs weight loss for blood pressure management.  Current medicines are reviewed at length with the patient today.  The patient does not have concerns regarding medicines.  The following changes have been made: As above  Labs/ tests ordered today include:   Orders Placed This Encounter  Procedures   NM Myocar Multi W/Spect W/Wall Motion / EF   ECHOCARDIOGRAM COMPLETE     Disposition:   FU with as needed based on results of the above   Signed, Minus Breeding, MD  02/04/2020 10:54 AM    [place]

## 2020-02-04 ENCOUNTER — Encounter: Payer: Self-pay | Admitting: Cardiology

## 2020-02-04 ENCOUNTER — Other Ambulatory Visit: Payer: Self-pay

## 2020-02-04 ENCOUNTER — Ambulatory Visit (INDEPENDENT_AMBULATORY_CARE_PROVIDER_SITE_OTHER): Payer: BC Managed Care – PPO | Admitting: Cardiology

## 2020-02-04 VITALS — BP 140/100 | HR 78 | Ht 72.0 in | Wt 259.0 lb

## 2020-02-04 DIAGNOSIS — R072 Precordial pain: Secondary | ICD-10-CM

## 2020-02-04 DIAGNOSIS — I447 Left bundle-branch block, unspecified: Secondary | ICD-10-CM | POA: Diagnosis not present

## 2020-02-04 DIAGNOSIS — E785 Hyperlipidemia, unspecified: Secondary | ICD-10-CM

## 2020-02-04 DIAGNOSIS — I10 Essential (primary) hypertension: Secondary | ICD-10-CM | POA: Diagnosis not present

## 2020-02-04 NOTE — Patient Instructions (Addendum)
Medication Instructions:  The current medical regimen is effective;  continue present plan and medications.  *If you need a refill on your cardiac medications before your next appointment, please call your pharmacy*  Testing/Procedures: Your physician has requested that you have an echocardiogram at [facility]. Echocardiography is a painless test that uses sound waves to create images of your heart. It provides your doctor with information about the size and shape of your heart and how well your heart's chambers and valves are working. This procedure takes approximately one hour. There are no restrictions for this procedure.  Your physician has requested that you have a lexiscan myoview at [facility].  Nothing to eat/drink 4 hours before, except you may take your medications with sips of water.  No caffeine or decaffeinated anything 12 hours before.  Please report to the Main Entrance to check in at ________.   You will contacted to be scheduled for the above tests.  Follow-Up: At [facility], you and your health needs are our priority.  As part of our continuing mission to provide you with exceptional heart care, we have created designated Provider Care Teams.  These Care Teams include your primary Cardiologist (physician) and Advanced Practice Providers (APPs -  Physician Assistants and Nurse Practitioners) who all work together to provide you with the care you need, when you need it.  We recommend signing up for the patient portal called "MyChart".  Sign up information is provided on this After Visit Summary.  MyChart is used to connect with patients for Virtual Visits (Telemedicine).  Patients are able to view lab/test results, encounter notes, upcoming appointments, etc.  Non-urgent messages can be sent to your provider as well.   To learn more about what you can do with MyChart, go to [email or web address].    Your next appointment:   Will be determined based on the results of  the above tests.  Thank you for choosing [facility]!!

## 2020-02-19 ENCOUNTER — Encounter (HOSPITAL_COMMUNITY): Payer: Self-pay

## 2020-02-19 ENCOUNTER — Encounter (HOSPITAL_COMMUNITY)
Admission: RE | Admit: 2020-02-19 | Discharge: 2020-02-19 | Disposition: A | Discharge status: Home / Self Care | Payer: BC Managed Care – PPO | Source: Ambulatory Visit | Attending: Cardiology | Admitting: Cardiology

## 2020-02-19 ENCOUNTER — Ambulatory Visit (HOSPITAL_COMMUNITY)
Admission: RE | Admit: 2020-02-19 | Discharge: 2020-02-19 | Disposition: A | Discharge status: Home / Self Care | Payer: BC Managed Care – PPO | Source: Ambulatory Visit | Attending: Cardiology | Admitting: Cardiology

## 2020-02-19 ENCOUNTER — Other Ambulatory Visit: Payer: Self-pay

## 2020-02-19 ENCOUNTER — Encounter (HOSPITAL_BASED_OUTPATIENT_CLINIC_OR_DEPARTMENT_OTHER)
Admission: RE | Admit: 2020-02-19 | Discharge: 2020-02-19 | Disposition: A | Discharge status: Home / Self Care | Payer: BC Managed Care – PPO | Source: Ambulatory Visit | Attending: Cardiology | Admitting: Cardiology

## 2020-02-19 DIAGNOSIS — I10 Essential (primary) hypertension: Secondary | ICD-10-CM | POA: Insufficient documentation

## 2020-02-19 DIAGNOSIS — I447 Left bundle-branch block, unspecified: Secondary | ICD-10-CM | POA: Diagnosis not present

## 2020-02-19 DIAGNOSIS — R072 Precordial pain: Secondary | ICD-10-CM | POA: Insufficient documentation

## 2020-02-19 LAB — NM MYOCAR MULTI W/SPECT W/WALL MOTION / EF
LV dias vol: 135 mL (ref 62–150)
LV sys vol: 65 mL
Peak HR: 102 {beats}/min
RATE: 0.41
Rest HR: 72 {beats}/min
SDS: 0
SRS: 7
SSS: 7
TID: 1.21

## 2020-02-19 LAB — ECHOCARDIOGRAM COMPLETE
Area-P 1/2: 3.74 cm2
S' Lateral: 3.7 cm

## 2020-02-19 MED ORDER — SODIUM CHLORIDE FLUSH 0.9 % IV SOLN
INTRAVENOUS | Status: AC
  Administered 2020-02-19: 10 mL via INTRAVENOUS
  Filled 2020-02-19: qty 10

## 2020-02-19 MED ORDER — PERFLUTREN LIPID MICROSPHERE
1.0000 mL | INTRAVENOUS | Status: AC | PRN
  Administered 2020-02-19: 2 mL via INTRAVENOUS
  Filled 2020-02-19: qty 10

## 2020-02-19 MED ORDER — TECHNETIUM TC 99M TETROFOSMIN IV KIT
10.0000 | PACK | Freq: Once | INTRAVENOUS | Status: AC | PRN
  Administered 2020-02-19: 11 via INTRAVENOUS

## 2020-02-19 MED ORDER — REGADENOSON 0.4 MG/5ML IV SOLN
INTRAVENOUS | Status: AC
  Administered 2020-02-19: 0.4 mg via INTRAVENOUS
  Filled 2020-02-19: qty 5

## 2020-02-19 MED ORDER — TECHNETIUM TC 99M TETROFOSMIN IV KIT
30.0000 | PACK | Freq: Once | INTRAVENOUS | Status: AC | PRN
  Administered 2020-02-19: 32 via INTRAVENOUS

## 2020-02-19 NOTE — Progress Notes (Signed)
*  PRELIMINARY RESULTS* Echocardiogram 2D Echocardiogram with definity has been performed.  Leavy Cella 02/19/2020, 11:48 AM

## 2020-02-27 DIAGNOSIS — M5126 Other intervertebral disc displacement, lumbar region: Secondary | ICD-10-CM | POA: Diagnosis not present

## 2020-03-02 IMAGING — XA Imaging study
2 series · 2 of 2 positions shown · non-contrast
Comparison: none

CLINICAL DATA: Spondylosis without myelopathy. Left hip and thigh
pain, not extending below the knee. On review of the MRI, I think
there is an extraforaminal disc at L3-4 probably affecting the left
L3 nerve. The left L4 nerve could also be affected biforaminal disc
protrusion. Lastly, the patient does have some facet arthropathy
particularly at L4-5 which could contribute to the pain syndrome.

[Series 1: ortho standard · 1 of 1 slices shown (1 of 2)]
[im 1/1]
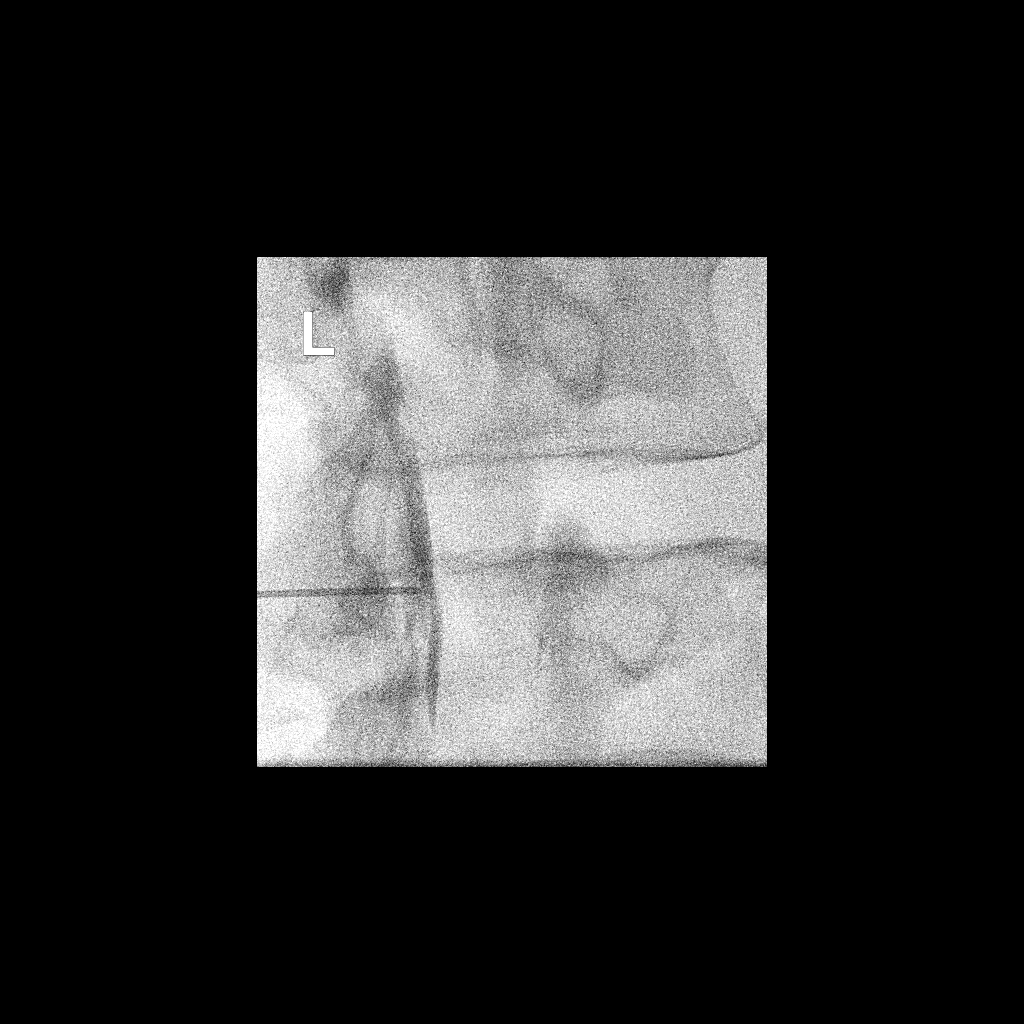

[Series 2: ortho standard · 1 of 1 slices shown (2 of 2)]
[im 1/1]
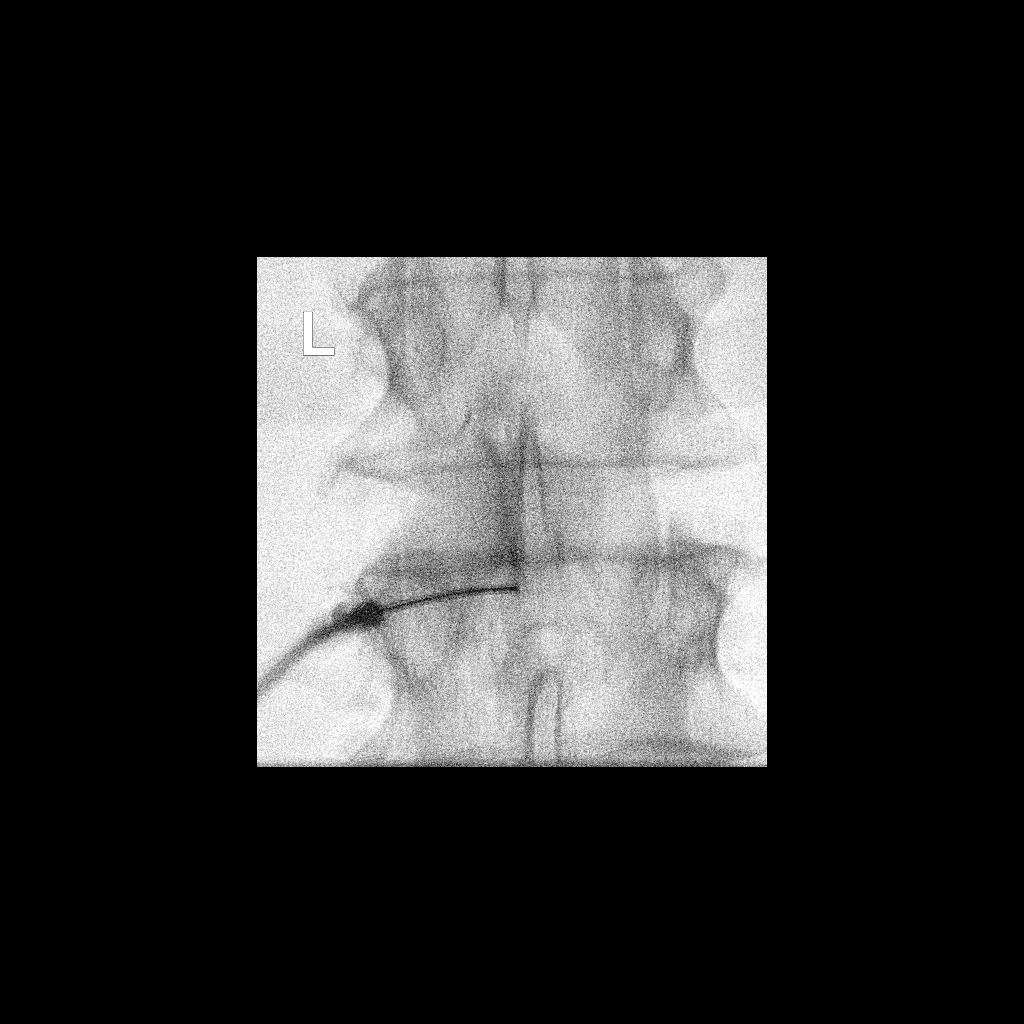

[2 of 2 positions shown; findings below may reference images not displayed]

FLUOROSCOPY TIME:  0 minutes 36 seconds. 34.16 micro gray meter
squared

PROCEDURE:
The procedure, risks, benefits, and alternatives were explained to
the patient. Questions regarding the procedure were encouraged and
answered. The patient understands and consents to the procedure.

LUMBAR EPIDURAL INJECTION:

An interlaminar approach was performed on the left at L3-4. The
overlying skin was cleansed and anesthetized. A 20 gauge epidural
needle was advanced using loss-of-resistance technique.

DIAGNOSTIC EPIDURAL INJECTION:

Injection of Isovue-M 200 shows a good epidural pattern with spread
above and below the level of needle placement, primarily on the
left. No vascular opacification is seen.

THERAPEUTIC EPIDURAL INJECTION:

One hundred twenty mg of Depo-Medrol mixed with 2.5 cc 1% lidocaine
were instilled. The procedure was well-tolerated, and the patient
was discharged thirty minutes following the injection in good
condition.

COMPLICATIONS:
None
IMPRESSION: Technically successful epidural injection on the left at L3-4 # 1.

As noted above, I think left foraminal to extraforaminal disc
material at L3-4 and to a lesser extent at L4-5 could be the cause
of the pain. Depending on how he responds to this, left L3 nerve
root block could be considered. Additionally, there could be some
contribution to his pain syndrome from facet arthropathy at L4-5.

## 2020-04-07 DIAGNOSIS — I429 Cardiomyopathy, unspecified: Secondary | ICD-10-CM | POA: Insufficient documentation

## 2020-04-07 DIAGNOSIS — R072 Precordial pain: Secondary | ICD-10-CM | POA: Insufficient documentation

## 2020-04-07 NOTE — Progress Notes (Signed)
Cardiology Office Note   Date:  04/08/2020   ID:  MYKE MAMMONE, DOB 1965/06/21, MRN [id number]  PCP:  Dettinger, Fransisca Kaufmann, MD  Cardiologist:   Minus Breeding, MD Referring: Dettinger, Fransisca Kaufmann, MD   No chief complaint on file.     History of Present Illness: Christopher Salas is a 56 y.o. male who presents for evaluation of LBBB.  He was referred by  Dettinger, Fransisca Kaufmann, MD.  He has no past cardiac history.    He was found incidentally to have a left bundle branch block.  Perfusion study demonstrated no ischemia.    His EF was mildly reduced at 45 - 50%.    He has had no new cardiac complaints since I saw him.  His biggest issue is he is very high back pain. The patient denies any new symptoms such as chest discomfort, neck or arm discomfort. There has been no new shortness of breath, PND or orthopnea. There have been no reported palpitations..   He has had some low blood pressures and he says it fluctuates significantly.  He feels lightheaded occasionally and in fact had 1 presyncopal or near syncopal episode after donating blood in the past.  Past Medical History:  Diagnosis Date  . Hyperlipidemia   . Hypertension   . Seizures ([place])     Past Surgical History:  Procedure Laterality Date  . BACK SURGERY    . HAND SURGERY       Current Outpatient Medications  Medication Sig Dispense Refill  . aspirin 81 MG tablet Take 81 mg by mouth daily.    . baclofen (LIORESAL) 10 MG tablet Take 1 tablet (10 mg total) by mouth 3 (three) times daily. 30 each 5  . Fe Bisgly-Succ-C-Thre-B12-FA (IRON-150 PO) Take 2 tablets by mouth daily.    . fenofibrate (TRICOR) 48 MG tablet Take 2 tablets (96 mg total) by mouth daily. 90 tablet 3  . gabapentin (NEURONTIN) 400 MG capsule Take 1 capsule (400 mg total) by mouth 3 (three) times daily. 270 capsule 3  . ibuprofen (ADVIL,MOTRIN) 800 MG tablet Take 800 mg by mouth every 8 (eight) hours as needed.    . meloxicam (MOBIC) 15 MG tablet Take 1 tablet (15  mg total) by mouth daily. 90 tablet 3  . niacin (NIASPAN) 500 MG CR tablet Take 1 tablet (500 mg total) by mouth daily with breakfast. 90 tablet 3  . olmesartan (BENICAR) 5 MG tablet Take 2 tablets (10 mg total) by mouth daily. 180 tablet 3  . omega-3 acid ethyl esters (LOVAZA) 1 g capsule Take 2 capsules (2 g total) by mouth 2 (two) times daily. 360 capsule 3  . PHENObarbital (LUMINAL) 100 MG tablet Take 1 tablet (100 mg total) by mouth at bedtime. 90 tablet 1  . phenobarbital (LUMINAL) 16.2 MG tablet Take 2 tablets (32.4 mg total) by mouth at bedtime. 180 tablet 1  . rosuvastatin (CRESTOR) 10 MG tablet Take 1 tablet (10 mg total) by mouth daily. 90 tablet 3  . sildenafil (REVATIO) 20 MG tablet TAKE 1 TO 4 TABLETS BY MOUTH ONCE DAILY AS NEEDED 30 tablet 3   No current facility-administered medications for this visit.    Allergies:   Patient has no known allergies.    ROS:  Please see the history of present illness.   Otherwise, review of systems are positive for decreased balance and back pain.   All other systems are reviewed and negative.    PHYSICAL  EXAM: VS:  BP 130/62   Pulse (!) 101   Ht 6' (1.829 m)   Wt 266 lb 9.6 oz (120.9 kg)   SpO2 93%   BMI 36.16 kg/m  , BMI Body mass index is 36.16 kg/m.  GENERAL:  Well appearing NECK:  No jugular venous distention, waveform within normal limits, carotid upstroke brisk and symmetric, no bruits, no thyromegaly LUNGS:  Clear to auscultation bilaterally CHEST:  Unremarkable HEART:  PMI not displaced or sustained,S1 and S2 within normal limits, no S3, no S4, no clicks, no rubs, nomurmurs ABD:  Flat, positive bowel sounds normal in frequency in pitch, no bruits, no rebound, no guarding, no midline pulsatile mass, no hepatomegaly, no splenomegaly EXT:  2 plus pulses throughout, no edema, no cyanosis no clubbing    EKG:  EKG is not ordered today.   Recent Labs: 11/26/2019: ALT 32; BUN 13; Creatinine, Ser 0.98; Hemoglobin 15.6; Platelets  240; Potassium 4.1; Sodium 141    Lipid Panel    Component Value Date/Time   CHOL 189 11/26/2019 1419   CHOL 123 07/19/2012 1547   TRIG 288 (H) 11/26/2019 1419   TRIG 252 (H) 04/27/2014 1739   TRIG 220 (H) 07/19/2012 1547   HDL 44 11/26/2019 1419   HDL 39 (L) 04/27/2014 1739   HDL 36 (L) 07/19/2012 1547   CHOLHDL 4.3 11/26/2019 1419   LDLCALC 97 11/26/2019 1419   LDLCALC 44 07/25/2013 1657   LDLCALC 43 07/19/2012 1547      Wt Readings from Last 3 Encounters:  04/08/20 266 lb 9.6 oz (120.9 kg)  02/04/20 259 lb (117.5 kg)  11/26/19 257 lb (116.6 kg)      Other studies Reviewed: Additional studies/ records that were reviewed today include:  Echo and Lexiscan Myoview. Review of the above records demonstrates:  Please see elsewhere in the note.     ASSESSMENT AND PLAN:  LBBB:   He had perfusion study that did not suggest ischemia so I do not think that cardiac cath is indicated.  He is not having any active chest discomfort.  He needs continued risk reduction.   CARDIOMYOPATHY: He does have a mildly reduced ejection fraction.  However, his lower blood pressures will not allow med titration.  He has no overt heart failure symptoms.  I am going to see him back in 6 months and repeat an echocardiogram in December.  If I have the opportunity with his blood pressure to titrate meds in the future will.  DYSLIPIDEMIA: LDL was 97.  I will defer to his primary provider.   HTN: His blood pressure is labile as recorded.  We will follow this and I will try to med titrate if I can.   SNORE:  He does snore.   However, he is unable to sleep so I do not think a sleep study would be helpful at this point.  Would like to pursue this after he gets his back fixed.  PREOP:   The patient might need back surgery and from a standpoint of ACC/AHA guidelines the patient would be an acceptable risk.  Current medicines are reviewed at length with the patient today.  The patient does not have concerns  regarding medicines.  The following changes have been made: None  Labs/ tests ordered today include: None  No orders of the defined types were placed in this encounter.    Disposition:    See me in 6 month in [place], Minus Breeding, MD  04/08/2020  11:01 AM    [place]

## 2020-04-08 ENCOUNTER — Ambulatory Visit: Payer: BC Managed Care – PPO | Admitting: Cardiology

## 2020-04-08 ENCOUNTER — Encounter: Payer: Self-pay | Admitting: Cardiology

## 2020-04-08 ENCOUNTER — Other Ambulatory Visit: Payer: Self-pay

## 2020-04-08 VITALS — BP 130/62 | HR 101 | Ht 72.0 in | Wt 266.6 lb

## 2020-04-08 DIAGNOSIS — R072 Precordial pain: Secondary | ICD-10-CM | POA: Diagnosis not present

## 2020-04-08 DIAGNOSIS — I447 Left bundle-branch block, unspecified: Secondary | ICD-10-CM | POA: Diagnosis not present

## 2020-04-08 DIAGNOSIS — I429 Cardiomyopathy, unspecified: Secondary | ICD-10-CM | POA: Diagnosis not present

## 2020-04-08 DIAGNOSIS — I10 Essential (primary) hypertension: Secondary | ICD-10-CM

## 2020-04-08 DIAGNOSIS — E785 Hyperlipidemia, unspecified: Secondary | ICD-10-CM

## 2020-04-08 NOTE — Patient Instructions (Signed)
Medication Instructions:  The current medical regimen is effective;  continue present plan and medications.  *If you need a refill on your cardiac medications before your next appointment, please call your pharmacy*   Follow-Up: At [facility], you and your health needs are our priority.  As part of our continuing mission to provide you with exceptional heart care, we have created designated Provider Care Teams.  These Care Teams include your primary Cardiologist (physician) and Advanced Practice Providers (APPs -  Physician Assistants and Nurse Practitioners) who all work together to provide you with the care you need, when you need it.  We recommend signing up for the patient portal called "MyChart".  Sign up information is provided on this After Visit Summary.  MyChart is used to connect with patients for Virtual Visits (Telemedicine).  Patients are able to view lab/test results, encounter notes, upcoming appointments, etc.  Non-urgent messages can be sent to your provider as well.   To learn more about what you can do with MyChart, go to [email or web address].    Your next appointment:   6 month(s)  The format for your next appointment:   In Person  Provider:   Minus Breeding, MD

## 2020-05-13 ENCOUNTER — Other Ambulatory Visit: Payer: Self-pay | Admitting: Family Medicine

## 2020-05-19 DIAGNOSIS — M5416 Radiculopathy, lumbar region: Secondary | ICD-10-CM | POA: Diagnosis not present

## 2020-05-26 ENCOUNTER — Other Ambulatory Visit: Payer: Self-pay

## 2020-05-26 ENCOUNTER — Encounter: Payer: Self-pay | Admitting: Family Medicine

## 2020-05-26 ENCOUNTER — Ambulatory Visit (INDEPENDENT_AMBULATORY_CARE_PROVIDER_SITE_OTHER): Payer: BC Managed Care – PPO | Admitting: Family Medicine

## 2020-05-26 VITALS — BP 129/84 | HR 68 | Ht 72.0 in | Wt 260.0 lb

## 2020-05-26 DIAGNOSIS — I10 Essential (primary) hypertension: Secondary | ICD-10-CM

## 2020-05-26 DIAGNOSIS — R569 Unspecified convulsions: Secondary | ICD-10-CM | POA: Diagnosis not present

## 2020-05-26 DIAGNOSIS — E782 Mixed hyperlipidemia: Secondary | ICD-10-CM

## 2020-05-26 MED ORDER — PHENOBARBITAL 16.2 MG PO TABS: 32.4000 mg | ORAL_TABLET | Freq: Every day | ORAL | 1 refills | Status: DC

## 2020-05-26 MED ORDER — FENOFIBRATE 48 MG PO TABS: 96.0000 mg | ORAL_TABLET | Freq: Every day | ORAL | 3 refills | Status: DC

## 2020-05-26 MED ORDER — PHENOBARBITAL 100 MG PO TABS: 100.0000 mg | ORAL_TABLET | Freq: Every day | ORAL | 1 refills | Status: DC

## 2020-05-26 NOTE — Progress Notes (Signed)
BP 129/84   Pulse 68   Ht 6' (1.829 m)   Wt 260 lb (117.9 kg)   SpO2 99%   BMI 35.26 kg/m    Subjective:   Patient ID: Christopher Salas, male    DOB: 11/17/1965, 55 y.o.   MRN: [id number]  HPI: Christopher Salas is a 55 y.o. male presenting on 05/26/2020 for Medical Management of Chronic Issues, Hyperlipidemia, Hypertension, and Fall (More frequent. Pt believes it is due to back surgery two years ago. Also has a pinched nerve. Currently having back injections. )   HPI Seizure recheck Patient is coming in today for recheck on his seizure disorder.  He has not had any seizures.  He takes the phenobarbital and has been doing well on it for years and has been stable for years.  Patient has been having increased falls and weakness because of his back pain and left leg weakness.  He is seeing his back specialist and they are possibly discussing the surgery, recommended to do therapies to keep his muscle strength up as much as he can.  Hypertension Patient is currently on Benicar, and their blood pressure today is 129/84. Patient denies any lightheadedness or dizziness. Patient denies headaches, blurred vision, chest pains, shortness of breath, or weakness. Denies any side effects from medication and is content with current medication.   Hyperlipidemia Patient is coming in for recheck of his hyperlipidemia. The patient is currently taking niacin and Lovaza and Crestor and fenofibrate. They deny any issues with myalgias or history of liver damage from it. They deny any focal numbness or weakness or chest pain.   Relevant past medical, surgical, family and social history reviewed and updated as indicated. Interim medical history since our last visit reviewed. Allergies and medications reviewed and updated.  Review of Systems  Constitutional: Negative for chills and fever.  Respiratory: Negative for shortness of breath and wheezing.   Cardiovascular: Negative for chest pain and leg swelling.   Musculoskeletal: Positive for arthralgias. Negative for gait problem.  Skin: Negative for rash.  Neurological: Positive for weakness. Negative for dizziness and light-headedness.  All other systems reviewed and are negative.   Per HPI unless specifically indicated above   Allergies as of 05/26/2020   No Known Allergies     Medication List       Accurate as of May 26, 2020  1:45 PM. If you have any questions, ask your nurse or doctor.        aspirin 81 MG tablet Take 81 mg by mouth daily.   baclofen 10 MG tablet Commonly known as: LIORESAL Take 1 tablet (10 mg total) by mouth 3 (three) times daily.   fenofibrate 48 MG tablet Commonly known as: TRICOR Take 2 tablets (96 mg total) by mouth daily.   gabapentin 400 MG capsule Commonly known as: NEURONTIN Take 1 capsule (400 mg total) by mouth 3 (three) times daily.   ibuprofen 800 MG tablet Commonly known as: ADVIL Take 800 mg by mouth every 8 (eight) hours as needed.   IRON-150 PO Take 2 tablets by mouth daily.   meloxicam 15 MG tablet Commonly known as: MOBIC Take 1 tablet (15 mg total) by mouth daily.   niacin 500 MG CR tablet Commonly known as: NIASPAN Take 1 tablet (500 mg total) by mouth daily with breakfast.   olmesartan 5 MG tablet Commonly known as: BENICAR Take 2 tablets (10 mg total) by mouth daily.   omega-3 acid ethyl esters 1 g  capsule Commonly known as: LOVAZA Take 2 capsules (2 g total) by mouth 2 (two) times daily.   PHENObarbital 100 MG tablet Commonly known as: LUMINAL Take 1 tablet (100 mg total) by mouth at bedtime.   phenobarbital 16.2 MG tablet Commonly known as: LUMINAL Take 2 tablets (32.4 mg total) by mouth at bedtime.   rosuvastatin 10 MG tablet Commonly known as: CRESTOR Take 1 tablet (10 mg total) by mouth daily.   sildenafil 20 MG tablet Commonly known as: REVATIO TAKE 1 TO 4 TABLETS BY MOUTH ONCE DAILY AS NEEDED        Objective:   BP 129/84   Pulse 68   Ht  6' (1.829 m)   Wt 260 lb (117.9 kg)   SpO2 99%   BMI 35.26 kg/m   Wt Readings from Last 3 Encounters:  05/26/20 260 lb (117.9 kg)  04/08/20 266 lb 9.6 oz (120.9 kg)  02/04/20 259 lb (117.5 kg)    Physical Exam Vitals and nursing note reviewed.  Constitutional:      General: He is not in acute distress.    Appearance: He is well-developed and well-nourished. He is not diaphoretic.  Eyes:     General: No scleral icterus.    Extraocular Movements: EOM normal.     Conjunctiva/sclera: Conjunctivae normal.  Neck:     Thyroid: No thyromegaly.  Cardiovascular:     Rate and Rhythm: Normal rate and regular rhythm.     Pulses: Intact distal pulses.     Heart sounds: Normal heart sounds. No murmur heard.   Pulmonary:     Effort: Pulmonary effort is normal. No respiratory distress.     Breath sounds: Normal breath sounds. No wheezing.  Musculoskeletal:        General: No swelling or edema.     Cervical back: Neck supple.  Lymphadenopathy:     Cervical: No cervical adenopathy.  Skin:    General: Skin is warm and dry.     Findings: No rash.  Neurological:     Mental Status: He is alert and oriented to person, place, and time.     Coordination: Coordination normal.  Psychiatric:        Mood and Affect: Mood and affect normal.        Behavior: Behavior normal.       Assessment & Plan:   Problem List Items Addressed This Visit      Cardiovascular and Mediastinum   Hypertension - Primary   Relevant Medications   fenofibrate (TRICOR) 48 MG tablet   Other Relevant Orders   CBC with Differential/Platelet   CMP14+EGFR     Other   Hyperlipidemia   Relevant Medications   fenofibrate (TRICOR) 48 MG tablet   Other Relevant Orders   Lipid panel   Seizures (HCC)   Relevant Medications   PHENObarbital (LUMINAL) 100 MG tablet   phenobarbital (LUMINAL) 16.2 MG tablet   Other Relevant Orders   ToxASSURE Select 13 (MW), Urine      Continue current medication.  No changes,  will check blood work. Follow up plan: Return in about 6 months (around 11/26/2020), or if symptoms worsen or fail to improve, for Physical exam and recheck for seizures and hypertension and cholesterol.  Counseling provided for all of the vaccine components Orders Placed This Encounter  Procedures  . CBC with Differential/Platelet  . CMP14+EGFR  . Lipid panel  . ToxASSURE Select 13 (MW), Urine    Caryl Pina, MD [place]  05/26/2020, 1:45 PM

## 2020-05-27 LAB — CBC WITH DIFFERENTIAL/PLATELET
Basophils Absolute: 0 10*3/uL (ref 0.0–0.2)
Basos: 1 %
EOS (ABSOLUTE): 0.1 10*3/uL (ref 0.0–0.4)
Eos: 2 %
Hematocrit: 45.9 % (ref 37.5–51.0)
Hemoglobin: 15.6 g/dL (ref 13.0–17.7)
Immature Grans (Abs): 0 10*3/uL (ref 0.0–0.1)
Immature Granulocytes: 0 %
Lymphocytes Absolute: 2.5 10*3/uL (ref 0.7–3.1)
Lymphs: 41 %
MCH: 31.8 pg (ref 26.6–33.0)
MCHC: 34 g/dL (ref 31.5–35.7)
MCV: 94 fL (ref 79–97)
Monocytes Absolute: 0.4 10*3/uL (ref 0.1–0.9)
Monocytes: 6 %
Neutrophils Absolute: 3.1 10*3/uL (ref 1.4–7.0)
Neutrophils: 50 %
Platelets: 343 10*3/uL (ref 150–450)
RBC: 4.9 x10E6/uL (ref 4.14–5.80)
RDW: 12.6 % (ref 11.6–15.4)
WBC: 6.1 10*3/uL (ref 3.4–10.8)

## 2020-05-27 LAB — CMP14+EGFR
ALT: 20 IU/L (ref 0–44)
AST: 27 IU/L (ref 0–40)
Albumin/Globulin Ratio: 2.1 (ref 1.2–2.2)
Albumin: 5.3 g/dL — ABNORMAL HIGH (ref 3.8–4.9)
Alkaline Phosphatase: 110 IU/L (ref 44–121)
BUN/Creatinine Ratio: 10 (ref 9–20)
BUN: 12 mg/dL (ref 6–24)
Bilirubin Total: 0.3 mg/dL (ref 0.0–1.2)
CO2: 22 mmol/L (ref 20–29)
Calcium: 10.2 mg/dL (ref 8.7–10.2)
Chloride: 101 mmol/L (ref 96–106)
Creatinine, Ser: 1.26 mg/dL (ref 0.76–1.27)
Globulin, Total: 2.5 g/dL (ref 1.5–4.5)
Glucose: 104 mg/dL — ABNORMAL HIGH (ref 65–99)
Potassium: 4.4 mmol/L (ref 3.5–5.2)
Sodium: 143 mmol/L (ref 134–144)
Total Protein: 7.8 g/dL (ref 6.0–8.5)
eGFR: 67 mL/min/{1.73_m2} (ref >59)

## 2020-05-27 LAB — LIPID PANEL
Chol/HDL Ratio: 4.8 ratio (ref 0.0–5.0)
Cholesterol, Total: 218 mg/dL — ABNORMAL HIGH (ref 100–199)
HDL: 45 mg/dL (ref >39)
LDL Chol Calc (NIH): 127 mg/dL — ABNORMAL HIGH (ref 0–99)
Triglycerides: 259 mg/dL — ABNORMAL HIGH (ref 0–149)
VLDL Cholesterol Cal: 46 mg/dL — ABNORMAL HIGH (ref 5–40)

## 2020-06-03 LAB — TOXASSURE SELECT 13 (MW), URINE

## 2020-06-10 DIAGNOSIS — M5416 Radiculopathy, lumbar region: Secondary | ICD-10-CM | POA: Diagnosis not present

## 2020-06-22 DIAGNOSIS — M5126 Other intervertebral disc displacement, lumbar region: Secondary | ICD-10-CM | POA: Diagnosis not present

## 2020-06-22 DIAGNOSIS — M5416 Radiculopathy, lumbar region: Secondary | ICD-10-CM | POA: Diagnosis not present

## 2020-06-22 DIAGNOSIS — Z9889 Other specified postprocedural states: Secondary | ICD-10-CM | POA: Diagnosis not present

## 2020-08-02 DIAGNOSIS — T8149XA Infection following a procedure, other surgical site, initial encounter: Secondary | ICD-10-CM | POA: Insufficient documentation

## 2020-08-09 DIAGNOSIS — R030 Elevated blood-pressure reading, without diagnosis of hypertension: Secondary | ICD-10-CM | POA: Insufficient documentation

## 2020-09-07 ENCOUNTER — Encounter: Payer: Self-pay | Admitting: *Deleted

## 2020-10-26 DIAGNOSIS — R0683 Snoring: Secondary | ICD-10-CM | POA: Insufficient documentation

## 2020-10-26 NOTE — Progress Notes (Signed)
Cardiology Office Note   Date:  10/27/2020   ID:  Christopher Salas, DOB 12/12/1965, MRN [id number]  PCP:  Salas, Christopher Kaufmann, MD  Cardiologist:   Christopher Breeding, MD Referring: Salas, Christopher Kaufmann, MD   Chief Complaint  Patient presents with   Fatigue       History of Present Illness: Christopher Salas is a 55 y.o. male who presents for evaluation of LBBB.  He was referred by  Salas, Christopher Kaufmann, MD.  He has no past cardiac history.    He was found incidentally to have a left bundle branch block.  Perfusion study demonstrated no ischemia.    His EF was mildly reduced at 45 - 50%.      Since I last saw him he has had continued fatigue.  However, he only sleeps 1 or 2 hours a night a lot of times.  He has back pain and insomnia.  He denies any new chest pressure, neck or arm discomfort.  He says he has some labile blood pressures but for the most part has been running high.  He is not having any new chest pressure, neck or arm discomfort.  He is not having any weight gain and in fact is lost a little weight.     Past Medical History:  Diagnosis Date   Hyperlipidemia    Hypertension    Seizures ([place])     Past Surgical History:  Procedure Laterality Date   BACK SURGERY     HAND SURGERY       Current Outpatient Medications  Medication Sig Dispense Refill   aspirin 81 MG tablet Take 81 mg by mouth daily.     baclofen (LIORESAL) 10 MG tablet Take 1 tablet (10 mg total) by mouth 3 (three) times daily. 30 each 5   Fe Bisgly-Succ-C-Thre-B12-FA (IRON-150 PO) Take 2 tablets by mouth daily.     fenofibrate (TRICOR) 48 MG tablet Take 2 tablets (96 mg total) by mouth daily. 180 tablet 3   gabapentin (NEURONTIN) 400 MG capsule Take 1 capsule (400 mg total) by mouth 3 (three) times daily. 270 capsule 3   ibuprofen (ADVIL,MOTRIN) 800 MG tablet Take 800 mg by mouth every 8 (eight) hours as needed.     meloxicam (MOBIC) 15 MG tablet Take 1 tablet (15 mg total) by mouth daily. 90 tablet 3    niacin (NIASPAN) 500 MG CR tablet Take 1 tablet (500 mg total) by mouth daily with breakfast. 90 tablet 3   omega-3 acid ethyl esters (LOVAZA) 1 g capsule Take 2 capsules (2 g total) by mouth 2 (two) times daily. 360 capsule 3   PHENObarbital (LUMINAL) 100 MG tablet Take 1 tablet (100 mg total) by mouth at bedtime. 90 tablet 1   phenobarbital (LUMINAL) 16.2 MG tablet Take 2 tablets (32.4 mg total) by mouth at bedtime. 180 tablet 1   rosuvastatin (CRESTOR) 10 MG tablet Take 1 tablet (10 mg total) by mouth daily. 90 tablet 3   sildenafil (REVATIO) 20 MG tablet TAKE 1 TO 4 TABLETS BY MOUTH ONCE DAILY AS NEEDED 30 tablet 3   olmesartan (BENICAR) 20 MG tablet Take 0.5 tablets (10 mg total) by mouth in the morning and at bedtime. 90 tablet 3   No current facility-administered medications for this visit.    Allergies:   Patient has no known allergies.    ROS:  Please see the history of present illness.   Otherwise, review of systems are positive for none.  All other systems are reviewed and negative.    PHYSICAL EXAM: VS:  BP 120/82   Pulse 84   Ht 6' (1.829 m)   Wt 251 lb (113.9 kg)   BMI 34.04 kg/m  , BMI Body mass index is 34.04 kg/m.  GENERAL:  Well appearing NECK:  No jugular venous distention, waveform within normal limits, carotid upstroke brisk and symmetric, no bruits, no thyromegaly LUNGS:  Clear to auscultation bilaterally CHEST:  Unremarkable HEART:  PMI not displaced or sustained,S1 and S2 within normal limits, no S3, no S4, no clicks, no rubs, no murmurs ABD:  Flat, positive bowel sounds normal in frequency in pitch, no bruits, no rebound, no guarding, no midline pulsatile mass, no hepatomegaly, no splenomegaly EXT:  2 plus pulses throughout, no edema, no cyanosis no clubbing  EKG:  EKG is  ordered today. Normal sinus rhythm, rate 84, axis within normal limits, intervals within normal limits, nonspecific inferolateral T wave changes.  Previous left bundle branch block no  longer present.  Recent Labs: 05/26/2020: ALT 20; BUN 12; Creatinine, Ser 1.26; Hemoglobin 15.6; Platelets 343; Potassium 4.4; Sodium 143    Lipid Panel    Component Value Date/Time   CHOL 218 (H) 05/26/2020 1353   CHOL 123 07/19/2012 1547   TRIG 259 (H) 05/26/2020 1353   TRIG 252 (H) 04/27/2014 1739   TRIG 220 (H) 07/19/2012 1547   HDL 45 05/26/2020 1353   HDL 39 (L) 04/27/2014 1739   HDL 36 (L) 07/19/2012 1547   CHOLHDL 4.8 05/26/2020 1353   LDLCALC 127 (H) 05/26/2020 1353   LDLCALC 44 07/25/2013 1657   LDLCALC 43 07/19/2012 1547      Wt Readings from Last 3 Encounters:  10/27/20 251 lb (113.9 kg)  05/26/20 260 lb (117.9 kg)  04/08/20 266 lb 9.6 oz (120.9 kg)      Other studies Reviewed: Additional studies/ records that were reviewed today include: Labs Review of the above records demonstrates:  Please see elsewhere in the note.     ASSESSMENT AND PLAN:  LBBB:   This seems to be intermittent.  He had a negative perfusion study.  No change in therapy.   CARDIOMYOPATHY:   He has a mildly reduced ejection fraction.  I am going to try to increase his olmesartan.  He says he had problems taking higher doses before but he agrees to try 10 mg twice a day.  I think he would benefit very slowly with med titration.  I actually think he is probably euvolemic at this point.   HTN:    He says his blood pressure has been creeping up.  I am going to try to go up on the meds as listed.    However, he has been a little reluctant to try increased doses because of some low blood pressures in the past.  We will see how he tolerates this.   SNORE: He does snore.  However, he does not think he wants a sleep study because he just does not sleep well.  He discussed this.     FATIGUE: This is almost definitely related to his abnormal sleep which is addressed as below.  Thyroid has been normal.  He is not anemic.  OBESITY: He has lost some weight.  I applauded this and encouraged more of the  same.  Current medicines are reviewed at length with the patient today.  The patient does not have concerns regarding medicines.  The following changes have been made:  As above  Labs/ tests ordered today include: None  Orders Placed This Encounter  Procedures   EKG 12-Lead      Disposition:    See me in 6 months   Signed, Christopher Breeding, MD  10/27/2020 3:26 PM    [facility]

## 2020-10-27 ENCOUNTER — Ambulatory Visit (INDEPENDENT_AMBULATORY_CARE_PROVIDER_SITE_OTHER): Payer: BC Managed Care – PPO | Admitting: Cardiology

## 2020-10-27 ENCOUNTER — Other Ambulatory Visit: Payer: Self-pay

## 2020-10-27 ENCOUNTER — Encounter: Payer: Self-pay | Admitting: Cardiology

## 2020-10-27 VITALS — BP 120/82 | HR 84 | Ht 72.0 in | Wt 251.0 lb

## 2020-10-27 DIAGNOSIS — I10 Essential (primary) hypertension: Secondary | ICD-10-CM | POA: Diagnosis not present

## 2020-10-27 DIAGNOSIS — I429 Cardiomyopathy, unspecified: Secondary | ICD-10-CM | POA: Diagnosis not present

## 2020-10-27 DIAGNOSIS — R0683 Snoring: Secondary | ICD-10-CM

## 2020-10-27 DIAGNOSIS — I447 Left bundle-branch block, unspecified: Secondary | ICD-10-CM | POA: Diagnosis not present

## 2020-10-27 DIAGNOSIS — E785 Hyperlipidemia, unspecified: Secondary | ICD-10-CM

## 2020-10-27 MED ORDER — OLMESARTAN MEDOXOMIL 20 MG PO TABS: 10.0000 mg | ORAL_TABLET | Freq: Two times a day (BID) | ORAL | 3 refills | Status: DC

## 2020-10-27 NOTE — Patient Instructions (Signed)
Medication Instructions:  Please increase Olmesartan to 10 mg one tablet twice a day. Continue all other medications as listed.  *If you need a refill on your cardiac medications before your next appointment, please call your pharmacy*  Follow-Up: At [facility], you and your health needs are our priority.  As part of our continuing mission to provide you with exceptional heart care, we have created designated Provider Care Teams.  These Care Teams include your primary Cardiologist (physician) and Advanced Practice Providers (APPs -  Physician Assistants and Nurse Practitioners) who all work together to provide you with the care you need, when you need it.  We recommend signing up for the patient portal called "MyChart".  Sign up information is provided on this After Visit Summary.  MyChart is used to connect with patients for Virtual Visits (Telemedicine).  Patients are able to view lab/test results, encounter notes, upcoming appointments, etc.  Non-urgent messages can be sent to your provider as well.   To learn more about what you can do with MyChart, go to [email or web address].    Your next appointment:   6 month(s)  The format for your next appointment:   In Person  Provider:   Minus Breeding, MD  Thank you for choosing [facility]!!

## 2020-11-12 ENCOUNTER — Other Ambulatory Visit: Payer: Self-pay | Admitting: Family Medicine

## 2020-11-12 DIAGNOSIS — E785 Hyperlipidemia, unspecified: Secondary | ICD-10-CM

## 2020-11-25 ENCOUNTER — Other Ambulatory Visit: Payer: Self-pay

## 2020-11-25 ENCOUNTER — Ambulatory Visit (INDEPENDENT_AMBULATORY_CARE_PROVIDER_SITE_OTHER): Payer: Medicare Other | Admitting: Family Medicine

## 2020-11-25 ENCOUNTER — Encounter: Payer: Self-pay | Admitting: Family Medicine

## 2020-11-25 VITALS — BP 106/74 | HR 86 | Ht 72.0 in | Wt 251.0 lb

## 2020-11-25 DIAGNOSIS — Z0001 Encounter for general adult medical examination with abnormal findings: Secondary | ICD-10-CM

## 2020-11-25 DIAGNOSIS — E785 Hyperlipidemia, unspecified: Secondary | ICD-10-CM | POA: Diagnosis not present

## 2020-11-25 DIAGNOSIS — Z1211 Encounter for screening for malignant neoplasm of colon: Secondary | ICD-10-CM | POA: Diagnosis not present

## 2020-11-25 DIAGNOSIS — R569 Unspecified convulsions: Secondary | ICD-10-CM | POA: Diagnosis not present

## 2020-11-25 DIAGNOSIS — Z0000 Encounter for general adult medical examination without abnormal findings: Secondary | ICD-10-CM | POA: Diagnosis not present

## 2020-11-25 DIAGNOSIS — Z125 Encounter for screening for malignant neoplasm of prostate: Secondary | ICD-10-CM

## 2020-11-25 DIAGNOSIS — I10 Essential (primary) hypertension: Secondary | ICD-10-CM

## 2020-11-25 MED ORDER — OMEGA-3-ACID ETHYL ESTERS 1 G PO CAPS: 2.0000 | ORAL_CAPSULE | Freq: Two times a day (BID) | ORAL | 3 refills | Status: DC

## 2020-11-25 MED ORDER — PHENOBARBITAL 16.2 MG PO TABS: 32.4000 mg | ORAL_TABLET | Freq: Every day | ORAL | 1 refills | Status: DC

## 2020-11-25 MED ORDER — BACLOFEN 10 MG PO TABS: 10.0000 mg | ORAL_TABLET | Freq: Three times a day (TID) | ORAL | 5 refills | Status: DC

## 2020-11-25 MED ORDER — OLMESARTAN MEDOXOMIL 20 MG PO TABS: 10.0000 mg | ORAL_TABLET | Freq: Two times a day (BID) | ORAL | 3 refills | Status: DC

## 2020-11-25 MED ORDER — SILDENAFIL CITRATE 20 MG PO TABS: ORAL_TABLET | ORAL | 3 refills | Status: DC

## 2020-11-25 MED ORDER — PHENOBARBITAL 100 MG PO TABS: 100.0000 mg | ORAL_TABLET | Freq: Every day | ORAL | 1 refills | Status: DC

## 2020-11-25 MED ORDER — GABAPENTIN 400 MG PO CAPS: 400.0000 mg | ORAL_CAPSULE | Freq: Three times a day (TID) | ORAL | 3 refills | Status: DC

## 2020-11-25 MED ORDER — NIACIN ER (ANTIHYPERLIPIDEMIC) 500 MG PO TBCR: 500.0000 mg | EXTENDED_RELEASE_TABLET | Freq: Every day | ORAL | 3 refills | Status: DC

## 2020-11-25 NOTE — Progress Notes (Signed)
BP 106/74   Pulse 86   Ht 6' (1.829 m)   Wt 251 lb (113.9 kg)   SpO2 97%   BMI 34.04 kg/m    Subjective:   Patient ID: Christopher Salas, male    DOB: 17-Mar-1966, 55 y.o.   MRN: [id number]  HPI: Christopher Salas is a 55 y.o. male presenting on 11/25/2020 for Medical Management of Chronic Issues (CPE), Hypertension, and Hyperlipidemia   HPI Adult well exam Patient denies any chest pain, shortness of breath, headaches or vision issues, abdominal complaints, diarrhea, nausea, vomiting, or joint issues.   Seizure recheck Patient still takes the phenobarbital and has not had any seizures, he has been stable for many years  Hyperlipidemia Patient is coming in for recheck of his hyperlipidemia. The patient is currently taking Crestor and fish oil and niacin and fenofibrate. They deny any issues with myalgias or history of liver damage from it. They deny any focal numbness or weakness or chest pain.   Hypertension Patient is currently on olmesartan, and their blood pressure today is 106/74. Patient gets occasional lightheadedness when he stands up but he says is not very common and not bothersome. Patient denies headaches, blurred vision, chest pains, shortness of breath, or weakness. Denies any side effects from medication and is content with current medication.   Relevant past medical, surgical, family and social history reviewed and updated as indicated. Interim medical history since our last visit reviewed. Allergies and medications reviewed and updated.  Review of Systems  Constitutional:  Negative for chills and fever.  HENT:  Negative for ear pain and tinnitus.   Eyes:  Negative for pain.  Respiratory:  Negative for cough, shortness of breath and wheezing.   Cardiovascular:  Negative for chest pain, palpitations and leg swelling.  Gastrointestinal:  Negative for abdominal pain, blood in stool, constipation and diarrhea.  Genitourinary:  Negative for dysuria and hematuria.   Musculoskeletal:  Negative for back pain and myalgias.  Skin:  Negative for rash.  Neurological:  Negative for dizziness, weakness and headaches.  Psychiatric/Behavioral:  Negative for suicidal ideas.    Per HPI unless specifically indicated above   Allergies as of 11/25/2020   No Known Allergies      Medication List        Accurate as of November 25, 2020  3:13 PM. If you have any questions, ask your nurse or doctor.          aspirin 81 MG tablet Take 81 mg by mouth daily.   baclofen 10 MG tablet Commonly known as: LIORESAL Take 1 tablet (10 mg total) by mouth 3 (three) times daily.   fenofibrate 48 MG tablet Commonly known as: TRICOR Take 2 tablets (96 mg total) by mouth daily.   gabapentin 400 MG capsule Commonly known as: NEURONTIN Take 1 capsule (400 mg total) by mouth 3 (three) times daily.   ibuprofen 800 MG tablet Commonly known as: ADVIL Take 800 mg by mouth every 8 (eight) hours as needed.   IRON-150 PO Take 2 tablets by mouth daily.   meloxicam 15 MG tablet Commonly known as: MOBIC TAKE 1 TABLET BY MOUTH EVERY DAY   niacin 500 MG CR tablet Commonly known as: NIASPAN Take 1 tablet (500 mg total) by mouth daily with breakfast.   olmesartan 20 MG tablet Commonly known as: BENICAR Take 0.5 tablets (10 mg total) by mouth in the morning and at bedtime.   omega-3 acid ethyl esters 1 g capsule Commonly known  as: LOVAZA Take 2 capsules (2 g total) by mouth 2 (two) times daily.   PHENObarbital 100 MG tablet Commonly known as: LUMINAL Take 1 tablet (100 mg total) by mouth at bedtime.   phenobarbital 16.2 MG tablet Commonly known as: LUMINAL Take 2 tablets (32.4 mg total) by mouth at bedtime.   rosuvastatin 10 MG tablet Commonly known as: CRESTOR TAKE 1 TABLET BY MOUTH EVERY DAY   sildenafil 20 MG tablet Commonly known as: REVATIO TAKE 1 TO 4 TABLETS BY MOUTH ONCE DAILY AS NEEDED         Objective:   BP 106/74   Pulse 86   Ht 6'  (1.829 m)   Wt 251 lb (113.9 kg)   SpO2 97%   BMI 34.04 kg/m   Wt Readings from Last 3 Encounters:  11/25/20 251 lb (113.9 kg)  10/27/20 251 lb (113.9 kg)  05/26/20 260 lb (117.9 kg)    Physical Exam Vitals and nursing note reviewed.  Constitutional:      General: He is not in acute distress.    Appearance: Normal appearance. He is well-developed. He is not diaphoretic.  HENT:     Right Ear: External ear normal.     Left Ear: External ear normal.     Nose: Nose normal.     Mouth/Throat:     Pharynx: No oropharyngeal exudate.  Eyes:     General: No scleral icterus.       Right eye: No discharge.     Conjunctiva/sclera: Conjunctivae normal.     Pupils: Pupils are equal, round, and reactive to light.  Neck:     Thyroid: No thyromegaly.  Cardiovascular:     Rate and Rhythm: Normal rate and regular rhythm.     Heart sounds: Normal heart sounds. No murmur heard. Pulmonary:     Effort: Pulmonary effort is normal. No respiratory distress.     Breath sounds: Normal breath sounds. No wheezing.  Abdominal:     General: Bowel sounds are normal. There is no distension.     Palpations: Abdomen is soft.     Tenderness: There is no abdominal tenderness. There is no guarding or rebound.  Musculoskeletal:        General: Normal range of motion.     Cervical back: Neck supple.  Lymphadenopathy:     Cervical: No cervical adenopathy.  Skin:    General: Skin is warm and dry.     Findings: No rash.  Neurological:     Mental Status: He is alert and oriented to person, place, and time.     Coordination: Coordination normal.  Psychiatric:        Behavior: Behavior normal.      Assessment & Plan:   Problem List Items Addressed This Visit       Cardiovascular and Mediastinum   Hypertension   Relevant Medications   niacin (NIASPAN) 500 MG CR tablet   omega-3 acid ethyl esters (LOVAZA) 1 g capsule   sildenafil (REVATIO) 20 MG tablet   olmesartan (BENICAR) 20 MG tablet   Other  Relevant Orders   CMP14+EGFR     Other   Hyperlipidemia   Relevant Medications   niacin (NIASPAN) 500 MG CR tablet   omega-3 acid ethyl esters (LOVAZA) 1 g capsule   sildenafil (REVATIO) 20 MG tablet   olmesartan (BENICAR) 20 MG tablet   Other Relevant Orders   Lipid panel   Seizures (HCC)   Relevant Medications   gabapentin (NEURONTIN) 400 MG  capsule   PHENObarbital (LUMINAL) 100 MG tablet   phenobarbital (LUMINAL) 16.2 MG tablet   Other Visit Diagnoses     Well adult exam    -  Primary   Relevant Orders   CBC with Differential/Platelet   CMP14+EGFR   Lipid panel   PSA, total and free   Prostate cancer screening       Relevant Orders   PSA, total and free   Colon cancer screening       Relevant Orders   Cologuard     Continue current medicine, lightheadedness, possibly discussed decreasing olmesartan but we will keep for now and see if it worsens.  We will send Cologuard for patient's.  Check blood work and PSA today.  Follow up plan: Return in about 6 months (around 05/25/2021), or if symptoms worsen or fail to improve, for Hypertension and hyperlipidemia and seizures.  Counseling provided for all of the vaccine components Orders Placed This Encounter  Procedures   CBC with Differential/Platelet   CMP14+EGFR   Lipid panel   PSA, total and free   Cologuard    Caryl Pina, MD [facility] 11/25/2020, 3:13 PM

## 2020-11-26 ENCOUNTER — Encounter: Payer: BC Managed Care – PPO | Admitting: Family Medicine

## 2020-11-26 LAB — CBC WITH DIFFERENTIAL/PLATELET
Basophils Absolute: 0 10*3/uL (ref 0.0–0.2)
Basos: 0 %
EOS (ABSOLUTE): 0.2 10*3/uL (ref 0.0–0.4)
Eos: 4 %
Hematocrit: 42.3 % (ref 37.5–51.0)
Hemoglobin: 15.1 g/dL (ref 13.0–17.7)
Immature Grans (Abs): 0 10*3/uL (ref 0.0–0.1)
Immature Granulocytes: 0 %
Lymphocytes Absolute: 2.2 10*3/uL (ref 0.7–3.1)
Lymphs: 43 %
MCH: 32 pg (ref 26.6–33.0)
MCHC: 35.7 g/dL (ref 31.5–35.7)
MCV: 90 fL (ref 79–97)
Monocytes Absolute: 0.3 10*3/uL (ref 0.1–0.9)
Monocytes: 6 %
Neutrophils Absolute: 2.4 10*3/uL (ref 1.4–7.0)
Neutrophils: 47 %
Platelets: 250 10*3/uL (ref 150–450)
RBC: 4.72 x10E6/uL (ref 4.14–5.80)
RDW: 12.1 % (ref 11.6–15.4)
WBC: 5 10*3/uL (ref 3.4–10.8)

## 2020-11-26 LAB — LIPID PANEL
Chol/HDL Ratio: 5.5 ratio — ABNORMAL HIGH (ref 0.0–5.0)
Cholesterol, Total: 220 mg/dL — ABNORMAL HIGH (ref 100–199)
HDL: 40 mg/dL (ref >39)
LDL Chol Calc (NIH): 102 mg/dL — ABNORMAL HIGH (ref 0–99)
Triglycerides: 458 mg/dL — ABNORMAL HIGH (ref 0–149)
VLDL Cholesterol Cal: 78 mg/dL — ABNORMAL HIGH (ref 5–40)

## 2020-11-26 LAB — CMP14+EGFR
ALT: 16 IU/L (ref 0–44)
AST: 25 IU/L (ref 0–40)
Albumin/Globulin Ratio: 2.4 — ABNORMAL HIGH (ref 1.2–2.2)
Albumin: 5 g/dL — ABNORMAL HIGH (ref 3.8–4.9)
Alkaline Phosphatase: 92 IU/L (ref 44–121)
BUN/Creatinine Ratio: 17 (ref 9–20)
BUN: 18 mg/dL (ref 6–24)
Bilirubin Total: 0.3 mg/dL (ref 0.0–1.2)
CO2: 20 mmol/L (ref 20–29)
Calcium: 9.6 mg/dL (ref 8.7–10.2)
Chloride: 102 mmol/L (ref 96–106)
Creatinine, Ser: 1.06 mg/dL (ref 0.76–1.27)
Globulin, Total: 2.1 g/dL (ref 1.5–4.5)
Glucose: 95 mg/dL (ref 65–99)
Potassium: 4.1 mmol/L (ref 3.5–5.2)
Sodium: 140 mmol/L (ref 134–144)
Total Protein: 7.1 g/dL (ref 6.0–8.5)
eGFR: 82 mL/min/{1.73_m2} (ref >59)

## 2020-11-26 LAB — PSA, TOTAL AND FREE
PSA, Free Pct: 6.8 %
PSA, Free: 2.63 ng/mL
Prostate Specific Ag, Serum: 38.6 ng/mL — ABNORMAL HIGH (ref 0.0–4.0)

## 2020-11-30 ENCOUNTER — Telehealth: Payer: Self-pay | Admitting: Family Medicine

## 2020-11-30 NOTE — Telephone Encounter (Signed)
Informed pt that labs have not been reviewed at this time and will call once they are. Will send message to Dr. Warrick Parisian to make him aware that pt is asking for results.

## 2020-12-01 ENCOUNTER — Other Ambulatory Visit: Payer: Self-pay

## 2020-12-01 DIAGNOSIS — R972 Elevated prostate specific antigen [PSA]: Secondary | ICD-10-CM

## 2020-12-15 LAB — COLOGUARD

## 2020-12-21 ENCOUNTER — Encounter: Payer: Self-pay | Admitting: Urology

## 2020-12-21 ENCOUNTER — Other Ambulatory Visit: Payer: Self-pay

## 2020-12-21 ENCOUNTER — Ambulatory Visit: Payer: BC Managed Care – PPO | Admitting: Urology

## 2020-12-21 VITALS — BP 138/75 | HR 76 | Temp 98.6°F | Ht 72.0 in | Wt 251.2 lb

## 2020-12-21 DIAGNOSIS — R972 Elevated prostate specific antigen [PSA]: Secondary | ICD-10-CM | POA: Diagnosis not present

## 2020-12-21 NOTE — Progress Notes (Signed)

## 2020-12-21 NOTE — Progress Notes (Signed)
History of Present Illness: Pt presents for E/M of elevated PSA.  Previous to this month, he has had 3 PSA level since 2014, all in the 2.0-2.3 range.  However, on the ninth of this month, PSA was 38.6.  There were no significant changes in urinary pattern at the time of that blood draw.  He denies significant lower urinary tract symptoms.  IPSS 4, quality-of-life score 3.  He has had no dysuria or gross hematuria.  His father did have prostate cancer, apparently was treated with brachytherapy.  He died of cardiac arrest.  This is the patient's first urologic consultation.    Past Medical History:  Diagnosis Date   Hyperlipidemia    Hypertension    Seizures ([place])     Past Surgical History:  Procedure Laterality Date   BACK SURGERY     HAND SURGERY      Home Medications:  Allergies as of 12/21/2020   No Known Allergies      Medication List        Accurate as of December 21, 2020  8:33 AM. If you have any questions, ask your nurse or doctor.          aspirin 81 MG tablet Take 81 mg by mouth daily.   baclofen 10 MG tablet Commonly known as: LIORESAL Take 1 tablet (10 mg total) by mouth 3 (three) times daily.   fenofibrate 48 MG tablet Commonly known as: TRICOR Take 2 tablets (96 mg total) by mouth daily.   gabapentin 400 MG capsule Commonly known as: NEURONTIN Take 1 capsule (400 mg total) by mouth 3 (three) times daily.   ibuprofen 800 MG tablet Commonly known as: ADVIL Take 800 mg by mouth every 8 (eight) hours as needed.   IRON-150 PO Take 2 tablets by mouth daily.   meloxicam 15 MG tablet Commonly known as: MOBIC TAKE 1 TABLET BY MOUTH EVERY DAY   niacin 500 MG CR tablet Commonly known as: NIASPAN Take 1 tablet (500 mg total) by mouth daily with breakfast.   olmesartan 20 MG tablet Commonly known as: BENICAR Take 0.5 tablets (10 mg total) by mouth in the morning and at bedtime.   omega-3 acid ethyl esters 1 g capsule Commonly known as:  LOVAZA Take 2 capsules (2 g total) by mouth 2 (two) times daily.   PHENObarbital 100 MG tablet Commonly known as: LUMINAL Take 1 tablet (100 mg total) by mouth at bedtime.   phenobarbital 16.2 MG tablet Commonly known as: LUMINAL Take 2 tablets (32.4 mg total) by mouth at bedtime.   rosuvastatin 10 MG tablet Commonly known as: CRESTOR TAKE 1 TABLET BY MOUTH EVERY DAY   sildenafil 20 MG tablet Commonly known as: REVATIO TAKE 1 TO 4 TABLETS BY MOUTH ONCE DAILY AS NEEDED        Allergies: No Known Allergies  Family History  Problem Relation Age of Onset   Emphysema Mother    Diabetes Father    CAD Father 94       CABG X 5 (surgery twice)    Social History:  reports that he has never smoked. He has never used smokeless tobacco. He reports that he does not drink alcohol and does not use drugs.  ROS: A complete review of systems was performed.  All systems are negative except for pertinent findings as noted.  Physical Exam:  Vital signs in last 24 hours: There were no vitals taken for this visit. Constitutional:  Alert and oriented, No acute distress  Cardiovascular: Regular rate  Respiratory: Normal respiratory effort GI: Abdomen is soft, nontender, nondistended, no abdominal masses. No CVAT.  Genitourinary: Normal male phallus, testes are descended bilaterally and non-tender and without masses, scrotum is normal in appearance without lesions or masses, perineum is normal on inspection.  Normal anal sphincter tone.  Gland 30 g in size with mild asymmetry, left lobe larger than right with minimal firmness of the left side. Lymphatic: No lymphadenopathy Neurologic: Grossly intact, no focal deficits Psychiatric: Normal mood and affect  I have reviewed prior pt notes  I have reviewed notes from referring/previous physicians  I have reviewed urinalysis results  I have reviewed prior PSA results    Impression/Assessment:  Elevated PSA with mild palpable abnormality of  prostate  Plan:  1.  We will check PSA today  2.  If continued elevation, we will proceed with ultrasound and biopsy.  I briefly discussed this with the patient.

## 2020-12-22 LAB — PSA: Prostate Specific Ag, Serum: 40.1 ng/mL — ABNORMAL HIGH (ref 0.0–4.0)

## 2020-12-24 ENCOUNTER — Other Ambulatory Visit: Payer: Self-pay | Admitting: Urology

## 2020-12-24 DIAGNOSIS — R972 Elevated prostate specific antigen [PSA]: Secondary | ICD-10-CM

## 2020-12-24 MED ORDER — LEVOFLOXACIN 750 MG PO TABS: 750.0000 mg | ORAL_TABLET | Freq: Every day | ORAL | 0 refills | Status: AC

## 2020-12-28 ENCOUNTER — Telehealth: Payer: Self-pay

## 2020-12-28 DIAGNOSIS — R972 Elevated prostate specific antigen [PSA]: Secondary | ICD-10-CM

## 2020-12-28 MED ORDER — LEVOFLOXACIN 750 MG PO TABS: 750.0000 mg | ORAL_TABLET | Freq: Once | ORAL | 0 refills | Status: AC

## 2020-12-28 NOTE — Telephone Encounter (Signed)
-----   Message from Franchot Gallo, MD sent at 12/24/2020  2:32 PM EDT ----- Notify pt--psa still up--give #--I sent orders in for Bx ----- Message ----- From: Dorisann Frames, RN Sent: 12/22/2020   9:53 AM EDT To: Franchot Gallo, MD  Please review

## 2020-12-28 NOTE — Addendum Note (Signed)
Addended by: Dorisann Frames on: 12/28/2020 10:57 AM   Modules accepted: Orders

## 2021-01-17 DIAGNOSIS — Z1211 Encounter for screening for malignant neoplasm of colon: Secondary | ICD-10-CM | POA: Diagnosis not present

## 2021-01-18 ENCOUNTER — Ambulatory Visit (HOSPITAL_COMMUNITY)
Admission: RE | Admit: 2021-01-18 | Discharge: 2021-01-18 | Disposition: A | Discharge status: Home / Self Care | Payer: Medicare Other | Source: Ambulatory Visit | Attending: Urology | Admitting: Urology

## 2021-01-18 ENCOUNTER — Other Ambulatory Visit: Payer: Self-pay

## 2021-01-18 ENCOUNTER — Ambulatory Visit (HOSPITAL_BASED_OUTPATIENT_CLINIC_OR_DEPARTMENT_OTHER): Payer: Medicare Other | Admitting: Urology

## 2021-01-18 ENCOUNTER — Other Ambulatory Visit: Payer: Self-pay | Admitting: Urology

## 2021-01-18 ENCOUNTER — Encounter (HOSPITAL_COMMUNITY): Payer: Self-pay

## 2021-01-18 DIAGNOSIS — R972 Elevated prostate specific antigen [PSA]: Secondary | ICD-10-CM | POA: Insufficient documentation

## 2021-01-18 MED ORDER — LIDOCAINE HCL (PF) 2 % IJ SOLN: 10.0000 mL | Freq: Once | INTRAMUSCULAR | Status: AC

## 2021-01-18 MED ORDER — GENTAMICIN SULFATE 40 MG/ML IJ SOLN
INTRAMUSCULAR | Status: AC
  Administered 2021-01-18: 160 mg via INTRAMUSCULAR
  Filled 2021-01-18: qty 4

## 2021-01-18 MED ORDER — GENTAMICIN SULFATE 40 MG/ML IJ SOLN: 160.0000 mg | Freq: Once | INTRAMUSCULAR | Status: AC

## 2021-01-18 MED ORDER — LIDOCAINE HCL (PF) 2 % IJ SOLN
INTRAMUSCULAR | Status: AC
  Administered 2021-01-18: 10 mL
  Filled 2021-01-18: qty 10

## 2021-01-18 NOTE — Progress Notes (Signed)
Risks, benefits, and some of the potential complications of a transrectal ultrasounds of the prostate (TRUSP) with biopsies were discussed at length with the patient including gross hematuria, blood in the bowel movements, hematospermia, bacteremia, infection, voiding discomfort, urinary retention, fever, chills, sepsis, blood transfusion, death, and others. All questions were answered. Informed consent was obtained. The patient confirmed that he had taken his pre-procedure antibiotic. All anticoagulants were discontinued prior to the procedure. The patient emptied his bladder. He was positioned in a comfortable left lateral decubitus position with hips and knees acutely flexed.  The rectal probe was inserted into the rectum without difficulty. 10cc of 2% Lidocaine without epinephrine was instilled with a spinal needle using ultrasound guidance near the junction of each seminal vesicle and the prostate.  Sequential transverse (axial) scans were made in small increments beginning at the seminal vesicles and ending at the prostatic apex. Sequential longitudinal (saggital) scans were made in small increments beginning at the right lateral prostate and ending at the left lateral prostate. Excellent anatomical imaging was obtained. The peripheral, transitional, and central zones were well-defined. The seminal vesicles were normal.  Prostate volume 37.4 ml.  There were no hypoechoic areas. 12 needle core biopsies were performed. 1 biopsy each was taken from the following areas:  Right lateral base, right medial base, right lateral mid prostate, right medial mid prostate, right lateral apical prostate, right medial apical prostate, left lateral base, left medial base, left lateral mid prostate, left medial mid prostate, left lateral apical prostate, left medial apical prostate.. Minimal prostatic calcifications were noted. Excellent biopsy specimens were obtained.  Follow-up rectal examination was unremarkable.  The procedure was well-tolerated and without complications. Antibiotic instructions were given. The patient was told that:  For several days:  he should increase his fluid intake and limit strenuous activity  he might have mild discomfort at the base of his penis or in his rectum  he might have blood in his urine or blood in his bowel movements  For 2-3 months:  he might have blood in his ejaculate (semen)  Instructions were given to call the office immedicately for blood clots in the urine or bowel movements, difficulty urinating, inability to urinate, urinary retention, painful or frequent urination, fever, chills, nausea, vomiting, or other illness. The patient stated that he understood these instructions and would comply with them. We told the patient that prostate biopsy pathology reports are usually available within 3-5 working days, unless a pathologic second opinion is required, which may take 7-14 days. We told him to contact us to check on the status of his biopsy if he has not heard from [place] within 7 days. The patient left the ultrasound examination room in stable condition.

## 2021-01-18 NOTE — Progress Notes (Signed)
PT tolerated prostate biopsy procedure and antibiotic injections well today. Labs obtained and sent for pathology. PT ambulatory at discharge with no acute distress noted and verbalized understanding of discharge instructions.  

## 2021-01-25 ENCOUNTER — Encounter: Payer: Self-pay | Admitting: Urology

## 2021-01-25 ENCOUNTER — Other Ambulatory Visit: Payer: Self-pay

## 2021-01-25 ENCOUNTER — Ambulatory Visit: Payer: Medicare Other | Admitting: Urology

## 2021-01-25 DIAGNOSIS — C61 Malignant neoplasm of prostate: Secondary | ICD-10-CM | POA: Diagnosis not present

## 2021-01-25 NOTE — Progress Notes (Addendum)
History of Present Illness: Here for discussion following ultrasound and biopsy.  11.1.2022: TRUS/BX for nodular prostate, PSA 40.1.  Prostate volume 34.1 mL  11/12 cores were positive for adenocarcinoma: 1 core revealed GS 4+5 pattern  5 cores revealed GS 4+4 pattern 3 cores revealed GS 4+3 pattern 2 cores revealed GS 3+4 pattern  He has not had any significant issues since his biopsy.  He comes in today with his sister, Caryl Asp.  Past Medical History:  Diagnosis Date   Hyperlipidemia    Hypertension    Seizures ([place])     Past Surgical History:  Procedure Laterality Date   BACK SURGERY     HAND SURGERY      Home Medications:  Allergies as of 01/25/2021   No Known Allergies      Medication List        Accurate as of January 25, 2021 12:17 PM. If you have any questions, ask your nurse or doctor.          aspirin 81 MG tablet Take 81 mg by mouth daily.   baclofen 10 MG tablet Commonly known as: LIORESAL Take 1 tablet (10 mg total) by mouth 3 (three) times daily.   fenofibrate 48 MG tablet Commonly known as: TRICOR Take 2 tablets (96 mg total) by mouth daily.   gabapentin 400 MG capsule Commonly known as: NEURONTIN Take 1 capsule (400 mg total) by mouth 3 (three) times daily.   ibuprofen 800 MG tablet Commonly known as: ADVIL Take 800 mg by mouth every 8 (eight) hours as needed.   IRON-150 PO Take 2 tablets by mouth daily.   meloxicam 15 MG tablet Commonly known as: MOBIC TAKE 1 TABLET BY MOUTH EVERY DAY   niacin 500 MG CR tablet Commonly known as: NIASPAN Take 1 tablet (500 mg total) by mouth daily with breakfast.   olmesartan 20 MG tablet Commonly known as: BENICAR Take 0.5 tablets (10 mg total) by mouth in the morning and at bedtime.   omega-3 acid ethyl esters 1 g capsule Commonly known as: LOVAZA Take 2 capsules (2 g total) by mouth 2 (two) times daily.   PHENObarbital 100 MG tablet Commonly known as: LUMINAL Take 1 tablet (100 mg total)  by mouth at bedtime.   phenobarbital 16.2 MG tablet Commonly known as: LUMINAL Take 2 tablets (32.4 mg total) by mouth at bedtime.   rosuvastatin 10 MG tablet Commonly known as: CRESTOR TAKE 1 TABLET BY MOUTH EVERY DAY   sildenafil 20 MG tablet Commonly known as: REVATIO TAKE 1 TO 4 TABLETS BY MOUTH ONCE DAILY AS NEEDED        Allergies: No Known Allergies  Family History  Problem Relation Age of Onset   Emphysema Mother    Diabetes Father    CAD Father 70       CABG X 5 (surgery twice)    Social History:  reports that he has never smoked. He has never used smokeless tobacco. He reports that he does not drink alcohol and does not use drugs.  ROS: A complete review of systems was performed.  All systems are negative except for pertinent findings as noted.  Physical Exam:  Vital signs in last 24 hours: There were no vitals taken for this visit. Constitutional:  Alert and oriented, No acute distress Cardiovascular: Regular rate  Respiratory: Normal respiratory effort Lymphatic: No lymphadenopathy Neurologic: Grossly intact, no focal deficits Psychiatric: Normal mood and affect  I have reviewed prior pt notes   I  have reviewed urinalysis results  I have independently reviewed prior imaging  I have reviewed prior PSA/pathology results    Impression/Assessment:  High-volume, high risk prostate cancer  Plan:  I briefly discussed the pathology results with the patient and his sister.  He is aware of his high risk prostate cancer and the possibility of metastatic disease.  We will order CT abdomen/pelvis with contrast, as well as bone scan.  I will call with results and we will work at setting up consultation to discuss therapy.  43 minutes spent reviewing pathology, history, and speaking with patient/sister face-to-face.

## 2021-01-26 LAB — BUN+CREAT
BUN/Creatinine Ratio: 11 (ref 9–20)
BUN: 13 mg/dL (ref 6–24)
Creatinine, Ser: 1.16 mg/dL (ref 0.76–1.27)
eGFR: 73 mL/min/{1.73_m2} (ref >59)

## 2021-01-26 LAB — COLOGUARD: COLOGUARD: POSITIVE — AB

## 2021-01-27 ENCOUNTER — Telehealth: Payer: Self-pay

## 2021-01-27 ENCOUNTER — Encounter: Payer: Self-pay | Admitting: *Deleted

## 2021-01-27 NOTE — Telephone Encounter (Signed)
Patient's sister Caryl Asp calling for patient. Left a Voice Mail:  Needing to give some more info to Dr. Diona Fanti.   Has not been called back for bone scan to be done.  Please call Joy back at:  [phone]  Thanks, Helene Kelp

## 2021-01-27 NOTE — Telephone Encounter (Signed)
Received call from sister stating that she wanted to let you know his cologuard test came back positive.

## 2021-01-28 NOTE — Telephone Encounter (Signed)
Pt is aware that his cologuard came back positive and he needs to schedule a colonscopy but wants to speak to the nurse because he is supposed to be getting a CAT scan and bone density scan this month and has questions. Please call back and advise.

## 2021-01-28 NOTE — Telephone Encounter (Signed)
Called patient and he states that he wanted to let Dr. Diona Fanti know he has both his bone scan and CT scheduled. Patient had no other questions or concerns at this time.

## 2021-02-01 ENCOUNTER — Ambulatory Visit: Payer: Medicare Other | Admitting: Urology

## 2021-02-03 ENCOUNTER — Encounter: Payer: Self-pay | Admitting: *Deleted

## 2021-02-04 ENCOUNTER — Other Ambulatory Visit: Payer: Self-pay

## 2021-02-04 ENCOUNTER — Ambulatory Visit (HOSPITAL_COMMUNITY)
Admission: RE | Admit: 2021-02-04 | Discharge: 2021-02-04 | Disposition: A | Discharge status: Home / Self Care | Payer: Medicare Other | Source: Ambulatory Visit | Attending: Urology | Admitting: Urology

## 2021-02-04 DIAGNOSIS — C61 Malignant neoplasm of prostate: Secondary | ICD-10-CM | POA: Insufficient documentation

## 2021-02-04 DIAGNOSIS — N281 Cyst of kidney, acquired: Secondary | ICD-10-CM | POA: Diagnosis not present

## 2021-02-04 DIAGNOSIS — I700 Atherosclerosis of aorta: Secondary | ICD-10-CM | POA: Diagnosis not present

## 2021-02-04 MED ORDER — SODIUM CHLORIDE (PF) 0.9 % IJ SOLN
INTRAMUSCULAR | Status: AC
  Filled 2021-02-04: qty 50

## 2021-02-04 MED ORDER — IOHEXOL 350 MG/ML SOLN
80.0000 mL | Freq: Once | INTRAVENOUS | Status: AC | PRN
  Administered 2021-02-04: 80 mL via INTRAVENOUS

## 2021-02-14 ENCOUNTER — Other Ambulatory Visit: Payer: Self-pay

## 2021-02-14 ENCOUNTER — Encounter (HOSPITAL_COMMUNITY)
Admission: RE | Admit: 2021-02-14 | Discharge: 2021-02-14 | Disposition: A | Discharge status: Home / Self Care | Payer: Medicare Other | Source: Ambulatory Visit | Attending: Urology | Admitting: Urology

## 2021-02-14 DIAGNOSIS — C61 Malignant neoplasm of prostate: Secondary | ICD-10-CM | POA: Insufficient documentation

## 2021-02-14 MED ORDER — TECHNETIUM TC 99M MEDRONATE IV KIT
20.0000 | PACK | Freq: Once | INTRAVENOUS | Status: AC | PRN
  Administered 2021-02-14: 21.4 via INTRAVENOUS

## 2021-02-15 ENCOUNTER — Ambulatory Visit (INDEPENDENT_AMBULATORY_CARE_PROVIDER_SITE_OTHER): Payer: Medicare Other

## 2021-02-15 VITALS — Ht 72.0 in | Wt 250.0 lb

## 2021-02-15 DIAGNOSIS — Z0000 Encounter for general adult medical examination without abnormal findings: Secondary | ICD-10-CM

## 2021-02-15 NOTE — Progress Notes (Signed)
Subjective:   Christopher Salas is a 55 y.o. male who presents for an Initial Medicare Annual Wellness Visit.  Virtual Visit via Telephone Note  I connected with  Christopher Salas on 02/15/21 at  2:45 PM EST by telephone and verified that I am speaking with the correct person using two identifiers.  Location: Patient: Home Provider: [facility] Persons participating in the virtual visit: patient/Nurse Health Advisor   I discussed the limitations, risks, security and privacy concerns of performing an evaluation and management service by telephone and the availability of in person appointments. The patient expressed understanding and agreed to proceed.  Interactive audio and video telecommunications were attempted between this nurse and patient, however failed, due to patient having technical difficulties OR patient did not have access to video capability.  We continued and completed visit with audio only.  Some vital signs may be absent or patient reported.   Dineen Conradt E Leeona Mccardle, LPN   Review of Systems     Cardiac Risk Factors include: advanced age (>49men, >53 women);obesity (BMI >30kg/m2);sedentary lifestyle;male gender;family history of premature cardiovascular disease;dyslipidemia;hypertension;Other (see comment), Risk factor comments: cardiomyopathy     Objective:    Today's Vitals   02/15/21 1437  Weight: 250 lb (113.4 kg)  Height: 6' (1.829 m)   Body mass index is 33.91 kg/m.  Advanced Directives 02/15/2021 12/10/2017  Does Patient Have a Medical Advance Directive? No Yes  Type of Advance Directive - [place];Living will  Would patient like information on creating a medical advance directive? No - Patient declined -    Current Medications (verified) Outpatient Encounter Medications as of 02/15/2021  Medication Sig   aspirin 81 MG tablet Take 81 mg by mouth daily.   baclofen (LIORESAL) 10 MG tablet Take 1 tablet (10 mg total) by mouth 3 (three) times daily.   Fe  Bisgly-Succ-C-Thre-B12-FA (IRON-150 PO) Take 2 tablets by mouth daily.   fenofibrate (TRICOR) 48 MG tablet Take 2 tablets (96 mg total) by mouth daily.   gabapentin (NEURONTIN) 400 MG capsule Take 1 capsule (400 mg total) by mouth 3 (three) times daily.   ibuprofen (ADVIL,MOTRIN) 800 MG tablet Take 800 mg by mouth every 8 (eight) hours as needed.   meloxicam (MOBIC) 15 MG tablet TAKE 1 TABLET BY MOUTH EVERY DAY   niacin (NIASPAN) 500 MG CR tablet Take 1 tablet (500 mg total) by mouth daily with breakfast.   olmesartan (BENICAR) 20 MG tablet Take 0.5 tablets (10 mg total) by mouth in the morning and at bedtime.   omega-3 acid ethyl esters (LOVAZA) 1 g capsule Take 2 capsules (2 g total) by mouth 2 (two) times daily.   PHENObarbital (LUMINAL) 100 MG tablet Take 1 tablet (100 mg total) by mouth at bedtime.   phenobarbital (LUMINAL) 16.2 MG tablet Take 2 tablets (32.4 mg total) by mouth at bedtime.   rosuvastatin (CRESTOR) 10 MG tablet TAKE 1 TABLET BY MOUTH EVERY DAY   sildenafil (REVATIO) 20 MG tablet TAKE 1 TO 4 TABLETS BY MOUTH ONCE DAILY AS NEEDED   No facility-administered encounter medications on file as of 02/15/2021.    Allergies (verified) Patient has no known allergies.   History: Past Medical History:  Diagnosis Date   Hyperlipidemia    Hypertension    Seizures ([place])    Past Surgical History:  Procedure Laterality Date   BACK SURGERY     HAND SURGERY     Family History  Problem Relation Age of Onset  Emphysema Mother    Diabetes Father    CAD Father 55       CABG X 5 (surgery twice)   Social History   Socioeconomic History   Marital status: Divorced    Spouse name: Not on file   Number of children: Not on file   Years of education: Not on file   Highest education level: Not on file  Occupational History   Occupation: disability  Tobacco Use   Smoking status: Never   Smokeless tobacco: Never  Vaping Use   Vaping Use: Never used  Substance and Sexual  Activity   Alcohol use: No   Drug use: No   Sexual activity: Not on file  Other Topics Concern   Not on file  Social History Narrative   Not on file   Social Determinants of Health   Financial Resource Strain: Low Risk    Difficulty of Paying Living Expenses: Not very hard  Food Insecurity: No Food Insecurity   Worried About Running Out of Food in the Last Year: Never true   Ran Out of Food in the Last Year: Never true  Transportation Needs: No Transportation Needs   Lack of Transportation (Medical): No   Lack of Transportation (Non-Medical): No  Physical Activity: Insufficiently Active   Days of Exercise per Week: 4 days   Minutes of Exercise per Session: 30 min  Stress: No Stress Concern Present   Feeling of Stress : Only a little  Social Connections: Socially Isolated   Frequency of Communication with Friends and Family: Three times a week   Frequency of Social Gatherings with Friends and Family: Three times a week   Attends Religious Services: Never   Active Member of Clubs or Organizations: No   Attends Music therapist: Never   Marital Status: Divorced    Tobacco Counseling Counseling given: Not Answered   Clinical Intake:  Pre-visit preparation completed: Yes  Pain : No/denies pain     BMI - recorded: 33.91 Nutritional Status: BMI > 30  Obese Nutritional Risks: None Diabetes: No  How often do you need to have someone help you when you read instructions, pamphlets, or other written materials from your doctor or pharmacy?: 1 - Never  Diabetic? no  Interpreter Needed?: No  Information entered by :: Darl Brisbin, LPN   Activities of Daily Living In your present state of health, do you have any difficulty performing the following activities: 02/15/2021  Hearing? N  Vision? N  Difficulty concentrating or making decisions? N  Walking or climbing stairs? Y  Dressing or bathing? N  Doing errands, shopping? N  Preparing Food and eating ? N   Using the Toilet? N  In the past six months, have you accidently leaked urine? N  Do you have problems with loss of bowel control? N  Managing your Medications? N  Managing your Finances? N  Housekeeping or managing your Housekeeping? N  Some recent data might be hidden    Patient Care Team: Dettinger, Fransisca Kaufmann, MD as PCP - General (Family Medicine) Minus Breeding, MD as PCP - Cardiology (Cardiology)  Indicate any recent Medical Services you may have received from other than [facility] providers in the past year (date may be approximate).     Assessment:   This is a routine wellness examination for Nadim.  Hearing/Vision screen Hearing Screening - Comments:: Denies hearing difficulties  Vision Screening - Comments:: Denies vision difficulties - no eye doctor at this time  Dietary issues  and exercise activities discussed: Current Exercise Habits: Home exercise routine, Type of exercise: walking;stretching, Time (Minutes): 20, Frequency (Times/Week): 4, Weekly Exercise (Minutes/Week): 80, Intensity: Mild, Exercise limited by: neurologic condition(s);cardiac condition(s);orthopedic condition(s)   Goals Addressed             This Visit's Progress    Exercise 150 min/wk Moderate Activity         Depression Screen PHQ 2/9 Scores 02/15/2021 11/25/2020 05/26/2020 11/26/2019 09/01/2019 05/26/2019 01/31/2018  PHQ - 2 Score 2 4 0 0 0 0 0  PHQ- 9 Score 13 25 - - - - -    Fall Risk Fall Risk  02/15/2021 11/25/2020 05/26/2020 11/26/2019 09/01/2019  Falls in the past year? 1 1 1  0 1  Number falls in past yr: 0 1 1 1 1   Injury with Fall? 0 1 0 0 0  Risk for fall due to : Impaired balance/gait;Orthopedic patient;Medication side effect;Other (Comment) History of fall(s);Impaired balance/gait Impaired balance/gait;Orthopedic patient History of fall(s);Orthopedic patient;Impaired balance/gait Impaired balance/gait;Impaired mobility  Risk for fall due to: Comment neuropathy - - - -  Follow up Falls  prevention discussed Falls evaluation completed Falls evaluation completed - Falls evaluation completed    FALL RISK PREVENTION PERTAINING TO THE HOME:  Any stairs in or around the home? No  If so, are there any without handrails? No  Home free of loose throw rugs in walkways, pet beds, electrical cords, etc? Yes  Adequate lighting in your home to reduce risk of falls? Yes   ASSISTIVE DEVICES UTILIZED TO PREVENT FALLS:  Life alert? No  Use of a cane, walker or w/c? Yes  Grab bars in the bathroom? Yes  Shower chair or bench in shower? Yes  Elevated toilet seat or a handicapped toilet? Yes   TIMED UP AND GO:  Was the test performed? No . Telephonic visit  Cognitive Function:     6CIT Screen 02/15/2021  What Year? 0 points  What month? 0 points  What time? 0 points  Count back from 20 0 points  Months in reverse 4 points  Repeat phrase 2 points  Total Score 6    Immunizations Immunization History  Administered Date(s) Administered   PPD Test 03/15/2016   Tdap 03/15/2016    TDAP status: Up to date  Flu Vaccine status: Declined, Education has been provided regarding the importance of this vaccine but patient still declined. Advised may receive this vaccine at local pharmacy or Health Dept. Aware to provide a copy of the vaccination record if obtained from local pharmacy or Health Dept. Verbalized acceptance and understanding.  Pneumococcal vaccine status: Declined,  Education has been provided regarding the importance of this vaccine but patient still declined. Advised may receive this vaccine at local pharmacy or Health Dept. Aware to provide a copy of the vaccination record if obtained from local pharmacy or Health Dept. Verbalized acceptance and understanding.   Covid-19 vaccine status: Completed vaccines  Qualifies for Shingles Vaccine? Yes   Zostavax completed No   Shingrix Completed?: No.    Education has been provided regarding the importance of this vaccine.  Patient has been advised to call insurance company to determine out of pocket expense if they have not yet received this vaccine. Advised may also receive vaccine at local pharmacy or Health Dept. Verbalized acceptance and understanding.  Screening Tests Health Maintenance  Topic Date Due   COVID-19 Vaccine (1) Never done   Pneumococcal Vaccine 73-23 Years old (1 - PCV) Never done  Zoster Vaccines- Shingrix (1 of 2) 03/28/2021 (Originally [birth year]/1982)   INFLUENZA VACCINE  06/17/2021 (Originally 10/18/2020)   Fecal DNA (Cologuard)  01/18/2024   TETANUS/TDAP  03/15/2026   Hepatitis C Screening  Completed   HIV Screening  Completed   HPV VACCINES  Aged Out    Health Maintenance  Health Maintenance Due  Topic Date Due   COVID-19 Vaccine (1) Never done   Pneumococcal Vaccine 73-47 Years old (1 - PCV) Never done    Colorectal cancer screening: Type of screening: Cologuard. Completed 01/17/2021. Repeat every Unsure - waiting for further results as this was abnormal years  Lung Cancer Screening: (Low Dose CT Chest recommended if Age 66-80 years, 30 pack-year currently smoking OR have quit w/in 15years.) does not qualify.   Additional Screening:  Hepatitis C Screening: does qualify; Completed 03/15/2016  Vision Screening: Recommended annual ophthalmology exams for early detection of glaucoma and other disorders of the eye. Is the patient up to date with their annual eye exam?  No  Who is the provider or what is the name of the office in which the patient attends annual eye exams? none If pt is not established with a provider, would they like to be referred to a provider to establish care? No .   Dental Screening: Recommended annual dental exams for proper oral hygiene  Community Resource Referral / Chronic Care Management: CRR required this visit?  No   CCM required this visit?  No      Plan:     I have personally reviewed and noted the following in the patient's chart:    Medical and social history Use of alcohol, tobacco or illicit drugs  Current medications and supplements including opioid prescriptions. Patient is not currently taking opioid prescriptions. Functional ability and status Nutritional status Physical activity Advanced directives List of other physicians Hospitalizations, surgeries, and ER visits in previous 12 months Vitals Screenings to include cognitive, depression, and falls Referrals and appointments  In addition, I have reviewed and discussed with patient certain preventive protocols, quality metrics, and best practice recommendations. A written personalized care plan for preventive services as well as general preventive health recommendations were provided to patient.     Sandrea Hammond, LPN   [id number]   Nurse Notes: 6CIT = 6; positive depression screening

## 2021-02-15 NOTE — Patient Instructions (Signed)
[name] , Thank you for taking time to come for your Medicare Wellness Visit. I appreciate your ongoing commitment to your health goals. Please review the following plan we discussed and let me know if I can assist you in the future.   Screening recommendations/referrals: Colonoscopy: Cologuard done 01/17/2021 - abnormal - waiting for further results Recommended yearly ophthalmology/optometry visit for glaucoma screening and checkup Recommended yearly dental visit for hygiene and checkup  Vaccinations: Influenza vaccine: Declined Pneumococcal vaccine: Declined Tdap vaccine: Done 03/15/2016 - Repeat in 10 years Shingles vaccine: Due - 2 doses 6 months apart - over 90% effective   Covid-19: Done -2 doses - we need dates please  Advanced directives: Please bring a copy of your health care power of attorney and living will to the office to be added to your chart at your convenience.   Conditions/risks identified: Aim for 30 minutes of exercise or brisk walking each day, drink 6-8 glasses of water and eat lots of fruits and vegetables.   Next appointment: Follow up in one year for your annual wellness visit   Preventive Care 40-64 Years, Male Preventive care refers to lifestyle choices and visits with your health care provider that can promote health and wellness. What does preventive care include? A yearly physical exam. This is also called an annual well check. Dental exams once or twice a year. Routine eye exams. Ask your health care provider how often you should have your eyes checked. Personal lifestyle choices, including: Daily care of your teeth and gums. Regular physical activity. Eating a healthy diet. Avoiding tobacco and drug use. Limiting alcohol use. Practicing safe sex. Taking low-dose aspirin every day starting at age 5. What happens during an annual well check? The services and screenings done by your health care provider during your annual well check will depend on  your age, overall health, lifestyle risk factors, and family history of disease. Counseling  Your health care provider may ask you questions about your: Alcohol use. Tobacco use. Drug use. Emotional well-being. Home and relationship well-being. Sexual activity. Eating habits. Work and work Statistician. Screening  You may have the following tests or measurements: Height, weight, and BMI. Blood pressure. Lipid and cholesterol levels. These may be checked every 5 years, or more frequently if you are over 44 years old. Skin check. Lung cancer screening. You may have this screening every year starting at age 43 if you have a 30-pack-year history of smoking and currently smoke or have quit within the past 15 years. Fecal occult blood test (FOBT) of the stool. You may have this test every year starting at age 31. Flexible sigmoidoscopy or colonoscopy. You may have a sigmoidoscopy every 5 years or a colonoscopy every 10 years starting at age 78. Prostate cancer screening. Recommendations will vary depending on your family history and other risks. Hepatitis C blood test. Hepatitis B blood test. Sexually transmitted disease (STD) testing. Diabetes screening. This is done by checking your blood sugar (glucose) after you have not eaten for a while (fasting). You may have this done every 1-3 years. Discuss your test results, treatment options, and if necessary, the need for more tests with your health care provider. Vaccines  Your health care provider may recommend certain vaccines, such as: Influenza vaccine. This is recommended every year. Tetanus, diphtheria, and acellular pertussis (Tdap, Td) vaccine. You may need a Td booster every 10 years. Zoster vaccine. You may need this after age 58. Pneumococcal 13-valent conjugate (PCV13) vaccine. You may need  this if you have certain conditions and have not been vaccinated. Pneumococcal polysaccharide (PPSV23) vaccine. You may need one or two doses if  you smoke cigarettes or if you have certain conditions. Talk to your health care provider about which screenings and vaccines you need and how often you need them. This information is not intended to replace advice given to you by your health care provider. Make sure you discuss any questions you have with your health care provider. Document Released: 04/02/2015 Document Revised: 11/24/2015 Document Reviewed: 01/05/2015 Elsevier Interactive Patient Education  2017 [place] Prevention in the Home Falls can cause injuries. They can happen to people of all ages. There are many things you can do to make your home safe and to help prevent falls. What can I do on the outside of my home? Regularly fix the edges of walkways and driveways and fix any cracks. Remove anything that might make you trip as you walk through a door, such as a raised step or threshold. Trim any bushes or trees on the path to your home. Use bright outdoor lighting. Clear any walking paths of anything that might make someone trip, such as rocks or tools. Regularly check to see if handrails are loose or broken. Make sure that both sides of any steps have handrails. Any raised decks and porches should have guardrails on the edges. Have any leaves, snow, or ice cleared regularly. Use sand or salt on walking paths during winter. Clean up any spills in your garage right away. This includes oil or grease spills. What can I do in the bathroom? Use night lights. Install grab bars by the toilet and in the tub and shower. Do not use towel bars as grab bars. Use non-skid mats or decals in the tub or shower. If you need to sit down in the shower, use a plastic, non-slip stool. Keep the floor dry. Clean up any water that spills on the floor as soon as it happens. Remove soap buildup in the tub or shower regularly. Attach bath mats securely with double-sided non-slip rug tape. Do not have throw rugs and other things on the  floor that can make you trip. What can I do in the bedroom? Use night lights. Make sure that you have a light by your bed that is easy to reach. Do not use any sheets or blankets that are too big for your bed. They should not hang down onto the floor. Have a firm chair that has side arms. You can use this for support while you get dressed. Do not have throw rugs and other things on the floor that can make you trip. What can I do in the kitchen? Clean up any spills right away. Avoid walking on wet floors. Keep items that you use a lot in easy-to-reach places. If you need to reach something above you, use a strong step stool that has a grab bar. Keep electrical cords out of the way. Do not use floor polish or wax that makes floors slippery. If you must use wax, use non-skid floor wax. Do not have throw rugs and other things on the floor that can make you trip. What can I do with my stairs? Do not leave any items on the stairs. Make sure that there are handrails on both sides of the stairs and use them. Fix handrails that are broken or loose. Make sure that handrails are as long as the stairways. Check any carpeting to make sure that  it is firmly attached to the stairs. Fix any carpet that is loose or worn. Avoid having throw rugs at the top or bottom of the stairs. If you do have throw rugs, attach them to the floor with carpet tape. Make sure that you have a light switch at the top of the stairs and the bottom of the stairs. If you do not have them, ask someone to add them for you. What else can I do to help prevent falls? Wear shoes that: Do not have high heels. Have rubber bottoms. Are comfortable and fit you well. Are closed at the toe. Do not wear sandals. If you use a stepladder: Make sure that it is fully opened. Do not climb a closed stepladder. Make sure that both sides of the stepladder are locked into place. Ask someone to hold it for you, if possible. Clearly mark and make  sure that you can see: Any grab bars or handrails. First and last steps. Where the edge of each step is. Use tools that help you move around (mobility aids) if they are needed. These include: Canes. Walkers. Scooters. Crutches. Turn on the lights when you go into a dark area. Replace any light bulbs as soon as they burn out. Set up your furniture so you have a clear path. Avoid moving your furniture around. If any of your floors are uneven, fix them. If there are any pets around you, be aware of where they are. Review your medicines with your doctor. Some medicines can make you feel dizzy. This can increase your chance of falling. Ask your doctor what other things that you can do to help prevent falls. This information is not intended to replace advice given to you by your health care provider. Make sure you discuss any questions you have with your health care provider. Document Released: 12/31/2008 Document Revised: 08/12/2015 Document Reviewed: 04/10/2014 Elsevier Interactive Patient Education  2017 [employer].

## 2021-03-08 ENCOUNTER — Telehealth: Payer: Self-pay

## 2021-03-08 NOTE — Telephone Encounter (Signed)
See other note

## 2021-03-08 NOTE — Telephone Encounter (Signed)
Patient calling about surgery and next steps for surgery.  Please advise.  Thanks, Helene Kelp

## 2021-03-08 NOTE — Telephone Encounter (Signed)
Pt has had both CT and bone scan done and is calling inquiring on next steps. Please advise

## 2021-03-15 ENCOUNTER — Other Ambulatory Visit: Payer: Self-pay | Admitting: Urology

## 2021-03-15 DIAGNOSIS — C61 Malignant neoplasm of prostate: Secondary | ICD-10-CM

## 2021-03-17 ENCOUNTER — Encounter (HOSPITAL_COMMUNITY): Payer: Self-pay | Admitting: Radiology

## 2021-04-25 ENCOUNTER — Telehealth: Payer: Self-pay | Admitting: Cardiology

## 2021-04-25 ENCOUNTER — Other Ambulatory Visit: Payer: Self-pay | Admitting: Urology

## 2021-04-25 NOTE — Telephone Encounter (Unsigned)
° °  Pre-operative Risk Assessment    Patient Name: [name]  DOB: 1966/03/13 MRN: [id number]{ HEARTCARE STAFF-IMPORTANT INSTRUCTIONS 1 Red and Blue Text will auto delete once note is signed or closed. 2 Press F2 to navigate through template.   3 On drop down lists, L click to select >> R click to activate next field 4 Reason for Visit format is IMPORTANT!!  See Directions on No. 2 below. 5 Please review chart to determine if there is already a clearance note open for this procedure!!  DO NOT duplicate if a note already exists!!    :1}   {Is surgical clearance request for dental extraction/procedure?:[id number]}  Request for Surgical Clearance{ 1. What type of surgery is being performed? Enter name of procedure below and number of teeth if dental extraction.  :1}    Procedure:  Prostatectomy { 2. When is this surgery scheduled? Press F2 to enter date below and place date in Reason for Visit (see directions below). :1} Date of Surgery:  Clearance 05-18-21                              { For convenience, highlight and copy (CTL+C) the Clearance MM/DD/YY phrase above. Click here to go to Reason for Visit.  Paste (CTL+V) the date.  Merchandiser, retail.  Then click button underneath called Add Clearance MM/DD/YY as free text.     :1}  { 3. What is the name of the Surgeon, the Surgeon's Group or Practice, phone and fax number?  Press F2 and list below :1}  Surgeon:  Dr Alexis Frock Surgeon's Group or Practice Name:   Phone number:  [phone] Fax number:  [phone] { 4. What type of clearance is requested?  Medical or Cardiac Clearance only?  Pharmacy Clearance Only (Request is to hold medication only)?  Or Both?  Press F2 and select the clearance requested.  If both are needed, select both from the drop down list.     :1}  Type of Clearance Requested:  Medicine and Medical - Aspirin 5 days prior o surgeryt  { 5. What type of anesthesia will be used?  Press F2 and select the anesthesia  to be used for the procedure.  :1}  Type of Anesthesia:  General  { 6. Are there any other requests or questions from the surgeon?    :1}  Additional requests/questions:  {Select additional requests/questions or use asterisks to free text (Optional):[id number]}  Signed, Glyn Ade   04/25/2021, 10:43 AM

## 2021-04-25 NOTE — Telephone Encounter (Signed)
Patient already has a follow-up visit with Dr. Percival Spanish scheduled for later this week on 04/27/2021 which is before scheduled procedure; therefore, pre-op risk assessment can be addressed at that time. I will route clearance form to Dr. Percival Spanish so that he is aware and will add "PREOP EVAL" to the appointment notes. I will remove from pre-op pool.  Darreld Mclean, PA-C 04/25/2021 6:17 PM

## 2021-04-26 NOTE — Progress Notes (Signed)
Cardiology Office Note   Date:  04/27/2021   ID:  VEER ELAMIN, DOB 1965-10-13, MRN [id number]  PCP:  Dettinger, Fransisca Kaufmann, MD  Cardiologist:   Minus Breeding, MD Referring: Dettinger, Fransisca Kaufmann, MD   Chief Complaint  Patient presents with   Dizziness       History of Present Illness: Christopher Salas is a 56 y.o. male who presents for evaluation of LBBB.  He was referred by  Dettinger, Fransisca Kaufmann, MD.  He has no past cardiac history.    He was found incidentally to have a left bundle branch block.  Perfusion study demonstrated no ischemia.    His EF was mildly reduced at 45 - 50%.      He has been diagnosed with prostate cancer and I see that he is going to have robotic prostatectomy.  He has not had any new cardiovascular problems.  At a previous visit I did increase his olmesartan.  He does not tolerate up titration further because he has had presyncope and lightheadedness with labile blood pressures.  He tolerates the current regimen however.  He denies any new chest pressure, neck or arm discomfort.  He has no new shortness of breath, PND or orthopnea.  He does his chores of daily living but he does not do exercising.  He says the weather keeps him in.  He has gained some weight.  He sleeps chronically in a chair because of back and leg problems.  Past Medical History:  Diagnosis Date   Hyperlipidemia    Hypertension    Seizures ([place])     Past Surgical History:  Procedure Laterality Date   BACK SURGERY     HAND SURGERY       Current Outpatient Medications  Medication Sig Dispense Refill   aspirin 81 MG tablet Take 81 mg by mouth daily.     baclofen (LIORESAL) 10 MG tablet Take 1 tablet (10 mg total) by mouth 3 (three) times daily. 30 each 5   cyclobenzaprine (FLEXERIL) 10 MG tablet Take 10 mg by mouth every 8 (eight) hours as needed.     Fe Bisgly-Succ-C-Thre-B12-FA (IRON-150 PO) Take 2 tablets by mouth daily.     fenofibrate (TRICOR) 48 MG tablet Take 2 tablets (96 mg  total) by mouth daily. 180 tablet 3   gabapentin (NEURONTIN) 400 MG capsule Take 1 capsule (400 mg total) by mouth 3 (three) times daily. 270 capsule 3   ibuprofen (ADVIL,MOTRIN) 800 MG tablet Take 800 mg by mouth every 8 (eight) hours as needed.     meloxicam (MOBIC) 15 MG tablet TAKE 1 TABLET BY MOUTH EVERY DAY 90 tablet 3   niacin (NIASPAN) 500 MG CR tablet Take 1 tablet (500 mg total) by mouth daily with breakfast. 90 tablet 3   olmesartan (BENICAR) 20 MG tablet Take 0.5 tablets (10 mg total) by mouth in the morning and at bedtime. 90 tablet 3   omega-3 acid ethyl esters (LOVAZA) 1 g capsule Take 2 capsules (2 g total) by mouth 2 (two) times daily. 360 capsule 3   PHENObarbital (LUMINAL) 100 MG tablet Take 1 tablet (100 mg total) by mouth at bedtime. 90 tablet 1   phenobarbital (LUMINAL) 16.2 MG tablet Take 2 tablets (32.4 mg total) by mouth at bedtime. 180 tablet 1   rosuvastatin (CRESTOR) 10 MG tablet TAKE 1 TABLET BY MOUTH EVERY DAY 90 tablet 3   sildenafil (REVATIO) 20 MG tablet TAKE 1 TO 4 TABLETS BY MOUTH  ONCE DAILY AS NEEDED 30 tablet 3   No current facility-administered medications for this visit.    Allergies:   Patient has no known allergies.    ROS:  Please see the history of present illness.   Otherwise, review of systems are positive for none.   All other systems are reviewed and negative.    PHYSICAL EXAM: VS:  BP 136/88    Pulse 89    Wt 267 lb 9.6 oz (121.4 kg)    SpO2 97%    BMI 36.29 kg/m  , BMI Body mass index is 36.29 kg/m.  GENERAL:  Well appearing NECK:  No jugular venous distention, waveform within normal limits, carotid upstroke brisk and symmetric, no bruits, no thyromegaly LUNGS:  Clear to auscultation bilaterally CHEST:  Unremarkable HEART:  PMI not displaced or sustained,S1 and S2 within normal limits, no S3, no S4, no clicks, no rubs, no murmurs ABD:  Flat, positive bowel sounds normal in frequency in pitch, no bruits, no rebound, no guarding, no midline  pulsatile mass, no hepatomegaly, no splenomegaly EXT:  2 plus pulses throughout, no edema, no cyanosis no clubbing  EKG:  EKG is  ordered today. Normal sinus rhythm, rate 84, axis within normal limits, intervals within normal limits, nonspecific inferolateral T wave changes.  Previous left bundle branch block no longer present.  Recent Labs: 11/25/2020: ALT 16; Hemoglobin 15.1; Platelets 250; Potassium 4.1; Sodium 140 01/25/2021: BUN 13; Creatinine, Ser 1.16    Lipid Panel    Component Value Date/Time   CHOL 220 (H) 11/25/2020 1534   CHOL 123 07/19/2012 1547   TRIG 458 (H) 11/25/2020 1534   TRIG 252 (H) 04/27/2014 1739   TRIG 220 (H) 07/19/2012 1547   HDL 40 11/25/2020 1534   HDL 39 (L) 04/27/2014 1739   HDL 36 (L) 07/19/2012 1547   CHOLHDL 5.5 (H) 11/25/2020 1534   LDLCALC 102 (H) 11/25/2020 1534   LDLCALC 44 07/25/2013 1657   LDLCALC 43 07/19/2012 1547      Wt Readings from Last 3 Encounters:  04/27/21 267 lb 9.6 oz (121.4 kg)  02/15/21 250 lb (113.4 kg)  12/21/20 251 lb 3.2 oz (113.9 kg)      Other studies Reviewed: Additional studies/ records that were reviewed today include: Labs Review of the above records demonstrates:  Please see elsewhere in the note.     ASSESSMENT AND PLAN:   PREOP CLEARANCE: The patient has no high risk findings.  He is a high functional level.  He is not going for high risk procedure according to ACC/AHA guidelines.  He can hold his aspirin as needed for the procedure.  No further cardiovascular testing is suggested.  He does have a mildly reduced ejection fraction and so careful attention should be paid to volume management  LBBB: He had a negative perfusion study.  No change in therapy.   CARDIOMYOPATHY: He has not tolerated higher dose ACE inhibitor's so I will leave him on the dose he is on.  HTN:   His blood pressure is labile but he tolerates current regimen.   SNORE: He does not want a sleep study sure he has sleep apnea.   Not  sure he would comply with CPAP.  Therefore, unfortunately I do not think a home sleep study would be helpful  FATIGUE: This is unchanged and likely related to undiagnosed sleep apnea.   OBESITY:   Unfortunately he has gained back some weight that he lost and we discussed diet and exercise.  Current medicines are reviewed at length with the patient today.  The patient does not have concerns regarding medicines.  The following changes have been made:    None  Labs/ tests ordered today include: None  No orders of the defined types were placed in this encounter.     Disposition:    See me in 12 months   Signed, Minus Breeding, MD  04/27/2021 3:10 PM    [facility]

## 2021-04-27 ENCOUNTER — Encounter: Payer: Self-pay | Admitting: Cardiology

## 2021-04-27 ENCOUNTER — Other Ambulatory Visit: Payer: Self-pay

## 2021-04-27 ENCOUNTER — Ambulatory Visit (INDEPENDENT_AMBULATORY_CARE_PROVIDER_SITE_OTHER): Payer: Medicare Other | Admitting: Cardiology

## 2021-04-27 VITALS — BP 136/88 | HR 89 | Wt 267.6 lb

## 2021-04-27 DIAGNOSIS — I429 Cardiomyopathy, unspecified: Secondary | ICD-10-CM

## 2021-04-27 DIAGNOSIS — Z01810 Encounter for preprocedural cardiovascular examination: Secondary | ICD-10-CM

## 2021-04-27 DIAGNOSIS — R5383 Other fatigue: Secondary | ICD-10-CM | POA: Diagnosis not present

## 2021-04-27 DIAGNOSIS — I10 Essential (primary) hypertension: Secondary | ICD-10-CM

## 2021-04-27 NOTE — Patient Instructions (Signed)
Medication Instructions:  The current medical regimen is effective;  continue present plan and medications.  *If you need a refill on your cardiac medications before your next appointment, please call your pharmacy*  Follow-Up: At [facility], you and your health needs are our priority.  As part of our continuing mission to provide you with exceptional heart care, we have created designated Provider Care Teams.  These Care Teams include your primary Cardiologist (physician) and Advanced Practice Providers (APPs -  Physician Assistants and Nurse Practitioners) who all work together to provide you with the care you need, when you need it.  We recommend signing up for the patient portal called "MyChart".  Sign up information is provided on this After Visit Summary.  MyChart is used to connect with patients for Virtual Visits (Telemedicine).  Patients are able to view lab/test results, encounter notes, upcoming appointments, etc.  Non-urgent messages can be sent to your provider as well.   To learn more about what you can do with MyChart, go to [email or web address].    Your next appointment:   1 year(s)  The format for your next appointment:   In Person  Provider:   James Hochrein, MD   Thank you for choosing [facility]!!    

## 2021-04-28 ENCOUNTER — Other Ambulatory Visit: Payer: Self-pay | Admitting: Family Medicine

## 2021-04-28 DIAGNOSIS — R569 Unspecified convulsions: Secondary | ICD-10-CM

## 2021-04-28 MED ORDER — PHENOBARBITAL 100 MG PO TABS: 100.0000 mg | ORAL_TABLET | Freq: Every day | ORAL | 0 refills | Status: DC

## 2021-04-28 MED ORDER — PHENOBARBITAL 16.2 MG PO TABS: 32.4000 mg | ORAL_TABLET | Freq: Every day | ORAL | 0 refills | Status: DC

## 2021-04-28 NOTE — Addendum Note (Signed)
Addended by: Shellia Cleverly on: 04/28/2021 12:58 PM   Modules accepted: Orders

## 2021-04-28 NOTE — Telephone Encounter (Signed)
°  Prescription Request  04/28/2021  Is this a "Controlled Substance" medicine? no  Have you seen your PCP in the last 2 weeks? no  If YES, route message to pool  -  If NO, patient needs to be scheduled for appointment.  What is the name of the medication or equipment? phenobarbital (LUMINAL) 16.2 MG tablet  Have you contacted your pharmacy to request a refill? yes   Which pharmacy would you like this sent to? [facility]    Patient notified that their request is being sent to the clinical staff for review and that they should receive a response within 2 business days.

## 2021-04-28 NOTE — Progress Notes (Signed)
Sent message, via epic in basket, requesting orders in epic from surgeon.  

## 2021-04-29 MED ORDER — PHENOBARBITAL 16.2 MG PO TABS: 32.4000 mg | ORAL_TABLET | Freq: Every day | ORAL | 0 refills | Status: DC

## 2021-04-29 MED ORDER — PHENOBARBITAL 100 MG PO TABS: 100.0000 mg | ORAL_TABLET | Freq: Every day | ORAL | 0 refills | Status: DC

## 2021-04-29 NOTE — Addendum Note (Signed)
Addended by: Caryl Pina on: 04/29/2021 07:45 AM   Modules accepted: Orders

## 2021-04-29 NOTE — Telephone Encounter (Signed)
Sent 1 refill for him

## 2021-05-05 NOTE — Patient Instructions (Addendum)
DUE TO COVID-19 ONLY ONE VISITOR IS ALLOWED TO COME WITH YOU AND STAY IN THE WAITING ROOM ONLY DURING PRE OP AND PROCEDURE DAY OF SURGERY IF YOU ARE GOING HOME AFTER SURGERY. IF YOU ARE SPENDING THE NIGHT 2 PEOPLE MAY VISIT WITH YOU IN YOUR PRIVATE ROOM AFTER SURGERY UNTIL VISITING  HOURS ARE OVER AT 800 PM AND 1  VISITOR  MAY  SPEND THE NIGHT.   YOU NEED TO HAVE A COVID 19 TEST ON___2/27____ @_12 :30______, THIS TEST MUST BE DONE BEFORE SURGERY,                 Christopher Salas     Your procedure is scheduled on: 05/18/21   Report to [facility] Main  Entrance   Report to admitting at 6:15 AM     Call this number if you have problems the morning of surgery [phone]   Follow all diet and bowel prep instructions from the Dr's office.  Clear liquids the day before surgery   Remember: Do not eat  drink :After Midnight the night before your surgery.    CLEAR LIQUID DIET   Foods Allowed                                                                     Foods Excluded  Coffee and tea, regular and decaf                             liquids that you cannot  Plain Jell-O any favor except red or purple                                           see through such as: Fruit ices (not with fruit pulp)                                     milk, soups, orange juice  Iced Popsicles                                    All solid food Carbonated beverages, regular and diet                                    Cranberry, grape and apple juices Sports drinks like Gatorade Lightly seasoned clear broth or consume(fat free) Sugar    BRUSH YOUR TEETH MORNING OF SURGERY AND RINSE YOUR MOUTH OUT, NO CHEWING GUM CANDY OR MINTS.   Take these medicines the morning of surgery with A SIP OF WATER: Gabapentin                                You may not have any metal on your body including              piercings  Do not wear  jewelry,  lotions, powders or deodorant             Men may shave face and  neck.   Do not bring valuables to the hospital. [place].  Contacts, dentures or bridgework may not be worn into surgery.  Leave suitcase in the car. After surgery it may be brought to your room.                  Please read over the following fact sheets you were given: _____________________________________________________________________             [facility] - Preparing for Surgery Before surgery, you can play an important role.  Because skin is not sterile, your skin needs to be as free of germs as possible.  You can reduce the number of germs on your skin by washing with CHG (chlorahexidine gluconate) soap before surgery.  CHG is an antiseptic cleaner which kills germs and bonds with the skin to continue killing germs even after washing. Please DO NOT use if you have an allergy to CHG or antibacterial soaps.  If your skin becomes reddened/irritated stop using the CHG and inform your nurse when you arrive at Short Stay. You may shave your face/neck. Please follow these instructions carefully:  1.  Shower with CHG Soap the night before surgery and the  morning of Surgery.  2.  If you choose to wash your hair, wash your hair first as usual with your  normal  shampoo.  3.  After you shampoo, rinse your hair and body thoroughly to remove the  shampoo.                            4.  Use CHG as you would any other liquid soap.  You can apply chg directly  to the skin and wash                       Gently with a scrungie or clean washcloth.  5.  Apply the CHG Soap to your body ONLY FROM THE NECK DOWN.   Do not use on face/ open                           Wound or open sores. Avoid contact with eyes, ears mouth and genitals (private parts).                       Wash face,  Genitals (private parts) with your normal soap.             6.  Wash thoroughly, paying special attention to the area where your surgery  will be performed.  7.  Thoroughly  rinse your body with warm water from the neck down.  8.  DO NOT shower/wash with your normal soap after using and rinsing off  the CHG Soap.                9.  Pat yourself dry with a clean towel.            10.  Wear clean pajamas.            11.  Place clean sheets on your bed the night of your first shower and do not  sleep  with pets. Day of Surgery : Do not apply any lotions/deodorants the morning of surgery.  Please wear clean clothes to the hospital/surgery center.  FAILURE TO FOLLOW THESE INSTRUCTIONS MAY RESULT IN THE CANCELLATION OF YOUR SURGERY PATIENT SIGNATURE_________________________________  NURSE SIGNATURE__________________________________  ________________________________________________________________________   Adam Phenix  An incentive spirometer is a tool that can help keep your lungs clear and active. This tool measures how well you are filling your lungs with each breath. Taking long deep breaths may help reverse or decrease the chance of developing breathing (pulmonary) problems (especially infection) following: A long period of time when you are unable to move or be active. BEFORE THE PROCEDURE  If the spirometer includes an indicator to show your best effort, your nurse or respiratory therapist will set it to a desired goal. If possible, sit up straight or lean slightly forward. Try not to slouch. Hold the incentive spirometer in an upright position. INSTRUCTIONS FOR USE  Sit on the edge of your bed if possible, or sit up as far as you can in bed or on a chair. Hold the incentive spirometer in an upright position. Breathe out normally. Place the mouthpiece in your mouth and seal your lips tightly around it. Breathe in slowly and as deeply as possible, raising the piston or the ball toward the top of the column. Hold your breath for 3-5 seconds or for as long as possible. Allow the piston or ball to fall to the bottom of the column. Remove the mouthpiece  from your mouth and breathe out normally. Rest for a few seconds and repeat Steps 1 through 7 at least 10 times every 1-2 hours when you are awake. Take your time and take a few normal breaths between deep breaths. The spirometer may include an indicator to show your best effort. Use the indicator as a goal to work toward during each repetition. After each set of 10 deep breaths, practice coughing to be sure your lungs are clear. If you have an incision (the cut made at the time of surgery), support your incision when coughing by placing a pillow or rolled up towels firmly against it. Once you are able to get out of bed, walk around indoors and cough well. You may stop using the incentive spirometer when instructed by your caregiver.  RISKS AND COMPLICATIONS Take your time so you do not get dizzy or light-headed. If you are in pain, you may need to take or ask for pain medication before doing incentive spirometry. It is harder to take a deep breath if you are having pain. AFTER USE Rest and breathe slowly and easily. It can be helpful to keep track of a log of your progress. Your caregiver can provide you with a simple table to help with this. If you are using the spirometer at home, follow these instructions: [place] IF:  You are having difficultly using the spirometer. You have trouble using the spirometer as often as instructed. Your pain medication is not giving enough relief while using the spirometer. You develop fever of 100.5 F (38.1 C) or higher. SEEK IMMEDIATE MEDICAL CARE IF:  You cough up bloody sputum that had not been present before. You develop fever of 102 F (38.9 C) or greater. You develop worsening pain at or near the incision site. MAKE SURE YOU:  Understand these instructions. Will watch your condition. Will get help right away if you are not doing well or get worse. Document Released: 07/17/2006 Document Revised: 05/29/2011 Document  Reviewed:  09/17/2006 ExitCare Patient Information 2014 [place], [place].   ________________________________________________________________________

## 2021-05-09 ENCOUNTER — Encounter (HOSPITAL_COMMUNITY)
Admission: RE | Admit: 2021-05-09 | Discharge: 2021-05-09 | Disposition: A | Discharge status: Home / Self Care | Payer: Medicare Other | Source: Ambulatory Visit | Attending: Urology | Admitting: Urology

## 2021-05-09 ENCOUNTER — Encounter (HOSPITAL_COMMUNITY): Payer: Self-pay

## 2021-05-09 ENCOUNTER — Other Ambulatory Visit: Payer: Self-pay

## 2021-05-09 VITALS — BP 138/89 | HR 94 | Temp 98.2°F | Resp 18 | Ht 72.0 in | Wt 268.0 lb

## 2021-05-09 DIAGNOSIS — I10 Essential (primary) hypertension: Secondary | ICD-10-CM | POA: Diagnosis not present

## 2021-05-09 DIAGNOSIS — Z01818 Encounter for other preprocedural examination: Secondary | ICD-10-CM

## 2021-05-09 DIAGNOSIS — Z01812 Encounter for preprocedural laboratory examination: Secondary | ICD-10-CM | POA: Diagnosis not present

## 2021-05-09 HISTORY — DX: Myoneural disorder, unspecified: G70.9

## 2021-05-09 LAB — CBC
HCT: 41.6 % (ref 39.0–52.0)
Hemoglobin: 14.8 g/dL (ref 13.0–17.0)
MCH: 33 pg (ref 26.0–34.0)
MCHC: 35.6 g/dL (ref 30.0–36.0)
MCV: 92.7 fL (ref 80.0–100.0)
Platelets: 267 10*3/uL (ref 150–400)
RBC: 4.49 MIL/uL (ref 4.22–5.81)
RDW: 11.9 % (ref 11.5–15.5)
WBC: 4.6 10*3/uL (ref 4.0–10.5)
nRBC: 0 % (ref 0.0–0.2)

## 2021-05-09 LAB — BASIC METABOLIC PANEL
Anion gap: 7 (ref 5–15)
BUN: 20 mg/dL (ref 6–20)
CO2: 22 mmol/L (ref 22–32)
Calcium: 8.9 mg/dL (ref 8.9–10.3)
Chloride: 107 mmol/L (ref 98–111)
Creatinine, Ser: 1.19 mg/dL (ref 0.61–1.24)
GFR, Estimated: >60 mL/min (ref >60)
Glucose, Bld: 154 mg/dL — ABNORMAL HIGH (ref 70–99)
Potassium: 3.9 mmol/L (ref 3.5–5.1)
Sodium: 136 mmol/L (ref 135–145)

## 2021-05-09 NOTE — Progress Notes (Signed)
COVID test- 05/16/21 at 12:30   Bowel prep reminder:yes twice, I told him to look in his papers from the dr's office and call them if he can't find it  PCP - Dr. Lenna Sciara. Dettinger Cardiologist - Dr. Vita Barley  Chest x-ray - no EKG - 04/27/21-epic Stress Test - 02/19/20-epic ECHO - 02/19/20-epic Cardiac Cath - no Pacemaker/ICD device last checked:NA  Sleep Study - yes Stop bang score was 5 Pt doesn't want to have another study CPAP - no  Fasting Blood Sugar - NA Checks Blood Sugar _____ times a day  Blood Thinner Instructions:ASA 81 Dr. Percival Spanish Aspirin Instructions:stop 5 days prior to DOS/ Dr. Tresa Moore Last Dose:05/12/21  Anesthesia review: yes  Patient denies shortness of breath, fever, cough and chest pain at PAT appointment Pt denies SOB with activities. He uses a cane because of numbness of the left leg from back surgery. He has a stop bang score of 5 His Cardiologist is aware of his probable un diagnosed sleep apnea.He wanted to give blood before surgery and was told not to do it.   Patient verbalized understanding of instructions that were given to them at the PAT appointment. Patient was also instructed that they will need to review over the PAT instructions again at home before surgery. Instructions were reviewed with the Pt twice. He was told to call Dr. Zettie Pho office if he can't find the instructions for bowel prep

## 2021-05-16 ENCOUNTER — Other Ambulatory Visit: Payer: Self-pay | Admitting: Family Medicine

## 2021-05-16 ENCOUNTER — Other Ambulatory Visit: Payer: Self-pay

## 2021-05-16 ENCOUNTER — Encounter (HOSPITAL_COMMUNITY)
Admission: RE | Admit: 2021-05-16 | Discharge: 2021-05-16 | Disposition: A | Discharge status: Home / Self Care | Payer: Medicare Other | Source: Ambulatory Visit | Attending: Urology | Admitting: Urology

## 2021-05-16 DIAGNOSIS — Z01812 Encounter for preprocedural laboratory examination: Secondary | ICD-10-CM | POA: Diagnosis not present

## 2021-05-16 DIAGNOSIS — Z20822 Contact with and (suspected) exposure to covid-19: Secondary | ICD-10-CM | POA: Insufficient documentation

## 2021-05-16 DIAGNOSIS — Z01818 Encounter for other preprocedural examination: Secondary | ICD-10-CM

## 2021-05-16 LAB — SARS CORONAVIRUS 2 (TAT 6-24 HRS): SARS Coronavirus 2: NEGATIVE

## 2021-05-17 NOTE — Telephone Encounter (Signed)
March appt cancelled d/t surgery

## 2021-05-17 NOTE — Anesthesia Preprocedure Evaluation (Addendum)
Anesthesia Evaluation  Patient identified by MRN, date of birth, ID band Patient awake    Reviewed: Allergy & Precautions, NPO status , Patient's Chart, lab work & pertinent test results  Airway Mallampati: III  TM Distance: >3 FB Neck ROM: Full  Mouth opening: Limited Mouth Opening  Dental no notable dental hx. (+) Dental Advisory Given, Teeth Intact   Pulmonary neg pulmonary ROS,    Pulmonary exam normal breath sounds clear to auscultation       Cardiovascular hypertension, Pt. on medications Normal cardiovascular exam Rhythm:Regular Rate:Normal  TTE 2021 1. Left ventricular ejection fraction, by estimation, is 45 to 50%. The  left ventricle has mildly decreased function. The left ventricle  demonstrates global hypokinesis. There is mild left ventricular  hypertrophy. Left ventricular diastolic parameters  are indeterminate.  2. Right ventricular systolic function is normal. The right ventricular  size is normal. Tricuspid regurgitation signal is inadequate for assessing  PA pressure.  3. The mitral valve is grossly normal. No evidence of mitral valve  regurgitation.  4. The aortic valve is tricuspid. There is mild calcification of the  aortic valve. Aortic valve regurgitation is not visualized.  5. The inferior vena cava is normal in size with greater than 50%  respiratory variability, suggesting right atrial pressure of 3 mmHg.  EKG LBBB   Neuro/Psych Seizures -, Well Controlled,  negative psych ROS   GI/Hepatic negative GI ROS, Neg liver ROS,   Endo/Other  negative endocrine ROS  Renal/GU negative Renal ROS  negative genitourinary   Musculoskeletal negative musculoskeletal ROS (+)   Abdominal   Peds  Hematology negative hematology ROS (+)   Anesthesia Other Findings   Reproductive/Obstetrics                            Anesthesia Physical Anesthesia Plan  ASA:  2  Anesthesia Plan: General   Post-op Pain Management: Tylenol PO (pre-op)*, Lidocaine infusion* and Ketamine IV*   Induction: Intravenous  PONV Risk Score and Plan: 2 and Midazolam, Dexamethasone and Ondansetron  Airway Management Planned: Oral ETT  Additional Equipment:   Intra-op Plan:   Post-operative Plan: Extubation in OR  Informed Consent: I have reviewed the patients History and Physical, chart, labs and discussed the procedure including the risks, benefits and alternatives for the proposed anesthesia with the patient or authorized representative who has indicated his/her understanding and acceptance.     Dental advisory given  Plan Discussed with: CRNA  Anesthesia Plan Comments: (2 IVs)        Anesthesia Quick Evaluation

## 2021-05-18 ENCOUNTER — Other Ambulatory Visit: Payer: Self-pay

## 2021-05-18 ENCOUNTER — Encounter (HOSPITAL_COMMUNITY): Payer: Self-pay | Admitting: Urology

## 2021-05-18 ENCOUNTER — Ambulatory Visit (HOSPITAL_BASED_OUTPATIENT_CLINIC_OR_DEPARTMENT_OTHER): Payer: Medicare Other | Admitting: Anesthesiology

## 2021-05-18 ENCOUNTER — Ambulatory Visit (HOSPITAL_COMMUNITY): Payer: Medicare Other | Admitting: Anesthesiology

## 2021-05-18 ENCOUNTER — Observation Stay (HOSPITAL_COMMUNITY)
Admission: RE | Admit: 2021-05-18 | Discharge: 2021-05-20 | Disposition: A | Discharge status: Home / Self Care | Payer: Medicare Other | Source: Ambulatory Visit | Attending: Urology | Admitting: Urology

## 2021-05-18 ENCOUNTER — Encounter (HOSPITAL_COMMUNITY)
Admission: RE | Disposition: A | Discharge status: Home / Self Care | Payer: Self-pay | Source: Ambulatory Visit | Attending: Urology

## 2021-05-18 DIAGNOSIS — Z79899 Other long term (current) drug therapy: Secondary | ICD-10-CM | POA: Diagnosis not present

## 2021-05-18 DIAGNOSIS — C61 Malignant neoplasm of prostate: Secondary | ICD-10-CM | POA: Diagnosis not present

## 2021-05-18 DIAGNOSIS — I10 Essential (primary) hypertension: Secondary | ICD-10-CM | POA: Insufficient documentation

## 2021-05-18 DIAGNOSIS — I2510 Atherosclerotic heart disease of native coronary artery without angina pectoris: Secondary | ICD-10-CM | POA: Insufficient documentation

## 2021-05-18 DIAGNOSIS — C775 Secondary and unspecified malignant neoplasm of intrapelvic lymph nodes: Secondary | ICD-10-CM | POA: Insufficient documentation

## 2021-05-18 DIAGNOSIS — Z7982 Long term (current) use of aspirin: Secondary | ICD-10-CM | POA: Diagnosis not present

## 2021-05-18 HISTORY — PX: ROBOT ASSISTED LAPAROSCOPIC RADICAL PROSTATECTOMY: SHX5141

## 2021-05-18 HISTORY — PX: LYMPH NODE DISSECTION: SHX5087

## 2021-05-18 LAB — TYPE AND SCREEN
ABO/RH(D): B POS
Antibody Screen: NEGATIVE

## 2021-05-18 LAB — ABO/RH: ABO/RH(D): B POS

## 2021-05-18 LAB — HEMOGLOBIN AND HEMATOCRIT, BLOOD
HCT: 41.7 % (ref 39.0–52.0)
Hemoglobin: 14.1 g/dL (ref 13.0–17.0)

## 2021-05-18 SURGERY — PROSTATECTOMY, RADICAL, ROBOT-ASSISTED, LAPAROSCOPIC
Anesthesia: General

## 2021-05-18 MED ORDER — IRBESARTAN 150 MG PO TABS
150.0000 mg | ORAL_TABLET | Freq: Every day | ORAL | Status: DC
  Administered 2021-05-18 – 2021-05-20 (×3): 150 mg via ORAL
  Filled 2021-05-18 (×3): qty 1

## 2021-05-18 MED ORDER — LIDOCAINE HCL 2 % IJ SOLN
INTRAMUSCULAR | Status: AC
  Filled 2021-05-18: qty 20

## 2021-05-18 MED ORDER — SODIUM CHLORIDE 0.9 % IV BOLUS
1000.0000 mL | Freq: Once | INTRAVENOUS | Status: AC
  Administered 2021-05-18: 1000 mL via INTRAVENOUS

## 2021-05-18 MED ORDER — BACITRACIN-NEOMYCIN-POLYMYXIN 400-5-5000 EX OINT
1.0000 | TOPICAL_OINTMENT | Freq: Three times a day (TID) | CUTANEOUS | Status: DC | PRN
Start: 2021-05-18 — End: 2021-05-20

## 2021-05-18 MED ORDER — DOCUSATE SODIUM 100 MG PO CAPS
100.0000 mg | ORAL_CAPSULE | Freq: Two times a day (BID) | ORAL | Status: DC
Start: 2021-05-18 — End: 2021-05-20
  Administered 2021-05-18 – 2021-05-20 (×4): 100 mg via ORAL
  Filled 2021-05-18 (×4): qty 1

## 2021-05-18 MED ORDER — HYDROCODONE-ACETAMINOPHEN 5-325 MG PO TABS
1.0000 | ORAL_TABLET | Freq: Four times a day (QID) | ORAL | 0 refills | Status: DC | PRN
Start: 2021-05-18 — End: 2022-03-21

## 2021-05-18 MED ORDER — ONDANSETRON HCL 4 MG/2ML IJ SOLN
INTRAMUSCULAR | Status: AC
  Filled 2021-05-18: qty 2

## 2021-05-18 MED ORDER — LACTATED RINGERS IR SOLN
Status: DC | PRN
  Administered 2021-05-18: 1

## 2021-05-18 MED ORDER — FENTANYL CITRATE PF 50 MCG/ML IJ SOSY
PREFILLED_SYRINGE | INTRAMUSCULAR | Status: AC
  Filled 2021-05-18: qty 3

## 2021-05-18 MED ORDER — ROCURONIUM BROMIDE 10 MG/ML (PF) SYRINGE
PREFILLED_SYRINGE | INTRAVENOUS | Status: AC
  Filled 2021-05-18: qty 10

## 2021-05-18 MED ORDER — CEFAZOLIN IN SODIUM CHLORIDE 3-0.9 GM/100ML-% IV SOLN
3.0000 g | INTRAVENOUS | Status: DC
  Filled 2021-05-18: qty 100

## 2021-05-18 MED ORDER — ONDANSETRON HCL 4 MG/2ML IJ SOLN
INTRAMUSCULAR | Status: DC | PRN
  Administered 2021-05-18: 4 mg via INTRAVENOUS

## 2021-05-18 MED ORDER — GABAPENTIN 400 MG PO CAPS
400.0000 mg | ORAL_CAPSULE | Freq: Three times a day (TID) | ORAL | Status: DC
  Administered 2021-05-18 – 2021-05-20 (×5): 400 mg via ORAL
  Filled 2021-05-18 (×5): qty 1

## 2021-05-18 MED ORDER — PROMETHAZINE HCL 25 MG/ML IJ SOLN: 12.5000 mg | Freq: Once | INTRAMUSCULAR | Status: AC

## 2021-05-18 MED ORDER — BUPIVACAINE LIPOSOME 1.3 % IJ SUSP
INTRAMUSCULAR | Status: AC
  Filled 2021-05-18: qty 20

## 2021-05-18 MED ORDER — OXYCODONE HCL 5 MG PO TABS
5.0000 mg | ORAL_TABLET | ORAL | Status: DC | PRN
  Administered 2021-05-18 – 2021-05-20 (×4): 5 mg via ORAL
  Filled 2021-05-18 (×4): qty 1

## 2021-05-18 MED ORDER — PHENYLEPHRINE 40 MCG/ML (10ML) SYRINGE FOR IV PUSH (FOR BLOOD PRESSURE SUPPORT)
PREFILLED_SYRINGE | INTRAVENOUS | Status: AC
  Filled 2021-05-18: qty 20

## 2021-05-18 MED ORDER — SUGAMMADEX SODIUM 200 MG/2ML IV SOLN
INTRAVENOUS | Status: DC | PRN
  Administered 2021-05-18: 200 mg via INTRAVENOUS

## 2021-05-18 MED ORDER — DEXTROSE-NACL 5-0.45 % IV SOLN: INTRAVENOUS | Status: DC

## 2021-05-18 MED ORDER — FENTANYL CITRATE (PF) 100 MCG/2ML IJ SOLN
INTRAMUSCULAR | Status: AC
  Filled 2021-05-18: qty 2

## 2021-05-18 MED ORDER — POLYETHYLENE GLYCOL 3350 17 GM/SCOOP PO POWD: 1.0000 | Freq: Once | ORAL | Status: DC

## 2021-05-18 MED ORDER — SODIUM CHLORIDE (PF) 0.9 % IJ SOLN
INTRAMUSCULAR | Status: AC
  Filled 2021-05-18: qty 20

## 2021-05-18 MED ORDER — LACTATED RINGERS IV SOLN: INTRAVENOUS | Status: DC

## 2021-05-18 MED ORDER — CEFAZOLIN SODIUM-DEXTROSE 2-3 GM-%(50ML) IV SOLR
INTRAVENOUS | Status: DC | PRN
  Administered 2021-05-18: 2 g via INTRAVENOUS

## 2021-05-18 MED ORDER — FENTANYL CITRATE (PF) 100 MCG/2ML IJ SOLN
INTRAMUSCULAR | Status: DC | PRN
  Administered 2021-05-18: 100 ug via INTRAVENOUS

## 2021-05-18 MED ORDER — LIDOCAINE 2% (20 MG/ML) 5 ML SYRINGE
INTRAMUSCULAR | Status: DC | PRN
  Administered 2021-05-18: 50 mg via INTRAVENOUS

## 2021-05-18 MED ORDER — DEXAMETHASONE SODIUM PHOSPHATE 10 MG/ML IJ SOLN
INTRAMUSCULAR | Status: AC
  Filled 2021-05-18: qty 1

## 2021-05-18 MED ORDER — KETAMINE HCL 10 MG/ML IJ SOLN
INTRAMUSCULAR | Status: DC | PRN
  Administered 2021-05-18 (×2): 20 mg via INTRAVENOUS

## 2021-05-18 MED ORDER — HYDROMORPHONE HCL 1 MG/ML IJ SOLN
0.5000 mg | INTRAMUSCULAR | Status: DC | PRN
  Administered 2021-05-18 – 2021-05-20 (×5): 1 mg via INTRAVENOUS
  Filled 2021-05-18 (×5): qty 1

## 2021-05-18 MED ORDER — ORAL CARE MOUTH RINSE: 15.0000 mL | Freq: Once | OROMUCOSAL | Status: AC

## 2021-05-18 MED ORDER — PROMETHAZINE HCL 25 MG/ML IJ SOLN
INTRAMUSCULAR | Status: AC
  Administered 2021-05-18: 12.5 mg via INTRAVENOUS
  Filled 2021-05-18: qty 1

## 2021-05-18 MED ORDER — PHENOBARBITAL 32.4 MG PO TABS
32.4000 mg | ORAL_TABLET | Freq: Every day | ORAL | Status: DC
Start: 2021-05-18 — End: 2021-05-20
  Administered 2021-05-18 – 2021-05-19 (×2): 32.4 mg via ORAL
  Filled 2021-05-18 (×2): qty 1

## 2021-05-18 MED ORDER — ACETAMINOPHEN 500 MG PO TABS
1000.0000 mg | ORAL_TABLET | Freq: Once | ORAL | Status: AC
  Administered 2021-05-18: 1000 mg via ORAL
  Filled 2021-05-18: qty 2

## 2021-05-18 MED ORDER — ROSUVASTATIN CALCIUM 10 MG PO TABS
10.0000 mg | ORAL_TABLET | Freq: Every day | ORAL | Status: DC
  Administered 2021-05-18 – 2021-05-20 (×3): 10 mg via ORAL
  Filled 2021-05-18 (×3): qty 1

## 2021-05-18 MED ORDER — LIDOCAINE HCL (PF) 2 % IJ SOLN
INTRAMUSCULAR | Status: DC | PRN
  Administered 2021-05-18: 1 mg/kg/h via INTRADERMAL

## 2021-05-18 MED ORDER — CHLORHEXIDINE GLUCONATE CLOTH 2 % EX PADS
6.0000 | MEDICATED_PAD | Freq: Every day | CUTANEOUS | Status: DC
  Administered 2021-05-19 – 2021-05-20 (×3): 6 via TOPICAL

## 2021-05-18 MED ORDER — ACETAMINOPHEN 500 MG PO TABS
1000.0000 mg | ORAL_TABLET | Freq: Four times a day (QID) | ORAL | Status: AC
  Administered 2021-05-18 – 2021-05-19 (×4): 1000 mg via ORAL
  Filled 2021-05-18 (×4): qty 2

## 2021-05-18 MED ORDER — PHENOBARBITAL 32.4 MG PO TABS
97.2000 mg | ORAL_TABLET | Freq: Every day | ORAL | Status: DC
  Administered 2021-05-18 – 2021-05-19 (×2): 97.2 mg via ORAL
  Filled 2021-05-18 (×2): qty 3

## 2021-05-18 MED ORDER — PROPOFOL 10 MG/ML IV BOLUS
INTRAVENOUS | Status: DC | PRN
  Administered 2021-05-18: 200 mg via INTRAVENOUS

## 2021-05-18 MED ORDER — SULFAMETHOXAZOLE-TRIMETHOPRIM 800-160 MG PO TABS
1.0000 | ORAL_TABLET | Freq: Two times a day (BID) | ORAL | 0 refills | Status: DC
Start: 2021-05-18 — End: 2021-09-05

## 2021-05-18 MED ORDER — SODIUM CHLORIDE (PF) 0.9 % IJ SOLN
INTRAMUSCULAR | Status: DC | PRN
  Administered 2021-05-18: 20 mL

## 2021-05-18 MED ORDER — PHENYLEPHRINE 40 MCG/ML (10ML) SYRINGE FOR IV PUSH (FOR BLOOD PRESSURE SUPPORT)
PREFILLED_SYRINGE | INTRAVENOUS | Status: DC | PRN
  Administered 2021-05-18: 80 ug via INTRAVENOUS

## 2021-05-18 MED ORDER — FENOFIBRATE 54 MG PO TABS
54.0000 mg | ORAL_TABLET | Freq: Every day | ORAL | Status: DC
  Administered 2021-05-18 – 2021-05-20 (×3): 54 mg via ORAL
  Filled 2021-05-18 (×3): qty 1

## 2021-05-18 MED ORDER — FENTANYL CITRATE PF 50 MCG/ML IJ SOSY
25.0000 ug | PREFILLED_SYRINGE | INTRAMUSCULAR | Status: DC | PRN
  Administered 2021-05-18 (×2): 50 ug via INTRAVENOUS

## 2021-05-18 MED ORDER — ONDANSETRON HCL 4 MG/2ML IJ SOLN: 4.0000 mg | INTRAMUSCULAR | Status: DC | PRN

## 2021-05-18 MED ORDER — DIPHENHYDRAMINE HCL 12.5 MG/5ML PO ELIX: 12.5000 mg | ORAL_SOLUTION | Freq: Four times a day (QID) | ORAL | Status: DC | PRN

## 2021-05-18 MED ORDER — PROPOFOL 10 MG/ML IV BOLUS
INTRAVENOUS | Status: AC
  Filled 2021-05-18: qty 20

## 2021-05-18 MED ORDER — DEXAMETHASONE SODIUM PHOSPHATE 10 MG/ML IJ SOLN
INTRAMUSCULAR | Status: DC | PRN
  Administered 2021-05-18: 10 mg via INTRAVENOUS

## 2021-05-18 MED ORDER — DOCUSATE SODIUM 100 MG PO CAPS: 100.0000 mg | ORAL_CAPSULE | Freq: Two times a day (BID) | ORAL | Status: AC

## 2021-05-18 MED ORDER — STERILE WATER FOR IRRIGATION IR SOLN
Status: DC | PRN
  Administered 2021-05-18: 1000 mL

## 2021-05-18 MED ORDER — ROCURONIUM BROMIDE 10 MG/ML (PF) SYRINGE
PREFILLED_SYRINGE | INTRAVENOUS | Status: DC | PRN
  Administered 2021-05-18: 100 mg via INTRAVENOUS

## 2021-05-18 MED ORDER — DIPHENHYDRAMINE HCL 50 MG/ML IJ SOLN: 12.5000 mg | Freq: Four times a day (QID) | INTRAMUSCULAR | Status: DC | PRN

## 2021-05-18 MED ORDER — MIDAZOLAM HCL 2 MG/2ML IJ SOLN
INTRAMUSCULAR | Status: AC
  Filled 2021-05-18: qty 2

## 2021-05-18 MED ORDER — BUPIVACAINE LIPOSOME 1.3 % IJ SUSP
INTRAMUSCULAR | Status: DC | PRN
  Administered 2021-05-18: 20 mL

## 2021-05-18 MED ORDER — KETAMINE HCL 50 MG/5ML IJ SOSY
PREFILLED_SYRINGE | INTRAMUSCULAR | Status: AC
  Filled 2021-05-18: qty 5

## 2021-05-18 MED ORDER — OMEGA-3-ACID ETHYL ESTERS 1 G PO CAPS
2.0000 | ORAL_CAPSULE | Freq: Two times a day (BID) | ORAL | Status: DC
  Administered 2021-05-18 – 2021-05-20 (×4): 2 g via ORAL
  Filled 2021-05-18 (×4): qty 2

## 2021-05-18 MED ORDER — NIACIN ER (ANTIHYPERLIPIDEMIC) 500 MG PO TBCR
500.0000 mg | EXTENDED_RELEASE_TABLET | Freq: Every day | ORAL | Status: DC
  Administered 2021-05-18 – 2021-05-19 (×2): 500 mg via ORAL
  Filled 2021-05-18 (×2): qty 1

## 2021-05-18 MED ORDER — HYOSCYAMINE SULFATE 0.125 MG SL SUBL
0.1250 mg | SUBLINGUAL_TABLET | SUBLINGUAL | Status: DC | PRN
  Filled 2021-05-18: qty 1

## 2021-05-18 MED ORDER — MIDAZOLAM HCL 5 MG/5ML IJ SOLN
INTRAMUSCULAR | Status: DC | PRN
  Administered 2021-05-18: 2 mg via INTRAVENOUS

## 2021-05-18 MED ORDER — PHENOBARBITAL 97.2 MG PO TABS: 100.0000 mg | ORAL_TABLET | Freq: Every day | ORAL | Status: DC

## 2021-05-18 MED ORDER — CHLORHEXIDINE GLUCONATE 0.12 % MT SOLN
15.0000 mL | Freq: Once | OROMUCOSAL | Status: AC
  Administered 2021-05-18: 15 mL via OROMUCOSAL

## 2021-05-18 SURGICAL SUPPLY — 67 items
APPLICATOR COTTON TIP 6 STRL (MISCELLANEOUS) ×2 IMPLANT
APPLICATOR COTTON TIP 6IN STRL (MISCELLANEOUS) ×3
BAG COUNTER SPONGE SURGICOUNT (BAG) IMPLANT
CATH FOLEY 2WAY SLVR 18FR 30CC (CATHETERS) ×3 IMPLANT
CATH TIEMANN FOLEY 18FR 5CC (CATHETERS) ×3 IMPLANT
CHLORAPREP W/TINT 26 (MISCELLANEOUS) ×3 IMPLANT
CLIP LIGATING HEM O LOK PURPLE (MISCELLANEOUS) ×15 IMPLANT
CNTNR URN SCR LID CUP LEK RST (MISCELLANEOUS) ×2 IMPLANT
CONT SPEC 4OZ STRL OR WHT (MISCELLANEOUS) ×3
COVER SURGICAL LIGHT HANDLE (MISCELLANEOUS) ×3 IMPLANT
COVER TIP SHEARS 8 DVNC (MISCELLANEOUS) ×2 IMPLANT
COVER TIP SHEARS 8MM DA VINCI (MISCELLANEOUS) ×3
CUTTER ECHEON FLEX ENDO 45 340 (ENDOMECHANICALS) ×3 IMPLANT
DERMABOND ADVANCED (GAUZE/BANDAGES/DRESSINGS) ×1
DERMABOND ADVANCED .7 DNX12 (GAUZE/BANDAGES/DRESSINGS) ×2 IMPLANT
DRAIN CHANNEL RND F F (WOUND CARE) IMPLANT
DRAPE ARM DVNC X/XI (DISPOSABLE) ×8 IMPLANT
DRAPE COLUMN DVNC XI (DISPOSABLE) ×2 IMPLANT
DRAPE DA VINCI XI ARM (DISPOSABLE) ×12
DRAPE DA VINCI XI COLUMN (DISPOSABLE) ×3
DRAPE SURG IRRIG POUCH 19X23 (DRAPES) ×3 IMPLANT
DRSG TEGADERM 4X4.75 (GAUZE/BANDAGES/DRESSINGS) ×3 IMPLANT
ELECT PENCIL ROCKER SW 15FT (MISCELLANEOUS) IMPLANT
ELECT REM PT RETURN 15FT ADLT (MISCELLANEOUS) ×3 IMPLANT
GAUZE 4X4 16PLY ~~LOC~~+RFID DBL (SPONGE) IMPLANT
GAUZE SPONGE 2X2 8PLY STRL LF (GAUZE/BANDAGES/DRESSINGS) IMPLANT
GLOVE SURG ENC MOIS LTX SZ6.5 (GLOVE) ×3 IMPLANT
GLOVE SURG ENC TEXT LTX SZ7.5 (GLOVE) ×6 IMPLANT
GLOVE SURG UNDER POLY LF SZ7.5 (GLOVE) ×3 IMPLANT
GOWN STRL REUS W/TWL LRG LVL3 (GOWN DISPOSABLE) ×9 IMPLANT
HOLDER FOLEY CATH W/STRAP (MISCELLANEOUS) ×3 IMPLANT
IRRIG SUCT STRYKERFLOW 2 WTIP (MISCELLANEOUS) ×3
IRRIGATION SUCT STRKRFLW 2 WTP (MISCELLANEOUS) ×2 IMPLANT
IV LACTATED RINGERS 1000ML (IV SOLUTION) ×3 IMPLANT
KIT PROCEDURE DA VINCI SI (MISCELLANEOUS) ×3
KIT PROCEDURE DVNC SI (MISCELLANEOUS) ×2 IMPLANT
KIT TURNOVER KIT A (KITS) IMPLANT
NDL INSUFFLATION 14GA 120MM (NEEDLE) ×2 IMPLANT
NDL SPNL 22GX7 QUINCKE BK (NEEDLE) ×2 IMPLANT
NEEDLE INSUFFLATION 14GA 120MM (NEEDLE) ×3 IMPLANT
NEEDLE SPNL 22GX7 QUINCKE BK (NEEDLE) ×3 IMPLANT
PACK ROBOT UROLOGY CUSTOM (CUSTOM PROCEDURE TRAY) ×3 IMPLANT
PAD POSITIONING PINK XL (MISCELLANEOUS) ×3 IMPLANT
PORT ACCESS TROCAR AIRSEAL 12 (TROCAR) ×2 IMPLANT
PORT ACCESS TROCAR AIRSEAL 5M (TROCAR) ×1
RELOAD STAPLE 45 4.1 GRN THCK (STAPLE) ×2 IMPLANT
SEAL CANN UNIV 5-8 DVNC XI (MISCELLANEOUS) ×8 IMPLANT
SEAL XI 5MM-8MM UNIVERSAL (MISCELLANEOUS) ×12
SET TRI-LUMEN FLTR TB AIRSEAL (TUBING) ×3 IMPLANT
SOLUTION ELECTROLUBE (MISCELLANEOUS) ×3 IMPLANT
SPIKE FLUID TRANSFER (MISCELLANEOUS) ×3 IMPLANT
SPONGE GAUZE 2X2 STER 10/PKG (GAUZE/BANDAGES/DRESSINGS) ×1
SPONGE T-LAP 4X18 ~~LOC~~+RFID (SPONGE) ×3 IMPLANT
STAPLE RELOAD 45 GRN (STAPLE) ×2 IMPLANT
STAPLE RELOAD 45MM GREEN (STAPLE) ×3
SUT ETHILON 3 0 PS 1 (SUTURE) ×3 IMPLANT
SUT MNCRL AB 4-0 PS2 18 (SUTURE) ×6 IMPLANT
SUT PDS AB 1 CT1 27 (SUTURE) ×6 IMPLANT
SUT VIC AB 2-0 SH 27 (SUTURE) ×3
SUT VIC AB 2-0 SH 27X BRD (SUTURE) ×2 IMPLANT
SUT VICRYL 0 UR6 27IN ABS (SUTURE) ×3 IMPLANT
SUT VLOC BARB 180 ABS3/0GR12 (SUTURE) ×9
SUTURE VLOC BRB 180 ABS3/0GR12 (SUTURE) ×6 IMPLANT
SYR 27GX1/2 1ML LL SAFETY (SYRINGE) ×3 IMPLANT
TOWEL OR NON WOVEN STRL DISP B (DISPOSABLE) ×3 IMPLANT
TROCAR XCEL NON-BLD 5MMX100MML (ENDOMECHANICALS) IMPLANT
WATER STERILE IRR 1000ML POUR (IV SOLUTION) ×3 IMPLANT

## 2021-05-18 NOTE — H&P (Signed)
Christopher Salas is an 56 y.o. male.   ? ?Chief Complaint: Pre-OP Prostatectomy ? ?HPI:  ? ?1 - Very High Risk Prostate Cancer - 11/12 cores up to 95% Grade 5 cancer by BX 01/2021 on eval PSA 40. TRUS 25mL, no median. CT, BS no overt metastasis (equivocal C and L spine uptake with DJD on bone scan).  ? ?PMH sig for obesity, Seizures (good control x years on phnobarb), back surgery/disability, CAD (follows Horchrein, no stens or blood thinners). His sister Caryl Asp is very involved and is retred Therapist, sports who worked with Dr. Janice Norrie. His PCP is Sport and exercise psychologist MD.  ? ?Today "Sam" is seen to proceed with prostatectomy / node dissection.  ? ?Past Medical History:  ?Diagnosis Date  ?? Hyperlipidemia   ?? Hypertension   ?? Neuromuscular disorder ([place])   ? Left leg numb and weak  ?? Seizures ([place])   ? last one 40 years ago  ? ? ?Past Surgical History:  ?Procedure Laterality Date  ?? BACK SURGERY  2020  ? L4-L6  ?? HAND SURGERY Right   ?? LAMINECTOMY AND MICRODISCECTOMY LUMBAR SPINE  2021  ? ? ?Family History  ?Problem Relation Age of Onset  ?? Emphysema Mother   ?? Diabetes Father   ?? CAD Father 70  ?     CABG X 5 (surgery twice)  ? ?Social History:  reports that he has never smoked. He has never used smokeless tobacco. He reports that he does not drink alcohol and does not use drugs. ? ?Allergies: No Known Allergies ? ?Medications Prior to Admission  ?Medication Sig Dispense Refill  ?? acetaminophen (TYLENOL) 325 MG tablet Take 650 mg by mouth every 6 (six) hours as needed for moderate pain.    ?? aspirin 81 MG tablet Take 81 mg by mouth daily.    ?? baclofen (LIORESAL) 10 MG tablet Take 1 tablet (10 mg total) by mouth 3 (three) times daily. 30 each 5  ?? cyclobenzaprine (FLEXERIL) 10 MG tablet Take 10 mg by mouth every 8 (eight) hours as needed for muscle spasms.    ?? Fe Bisgly-Succ-C-Thre-B12-FA (IRON-150 PO) Take 1 tablet by mouth daily.    ?? fenofibrate (TRICOR) 48 MG tablet TAKE 2 TABLETS (96 MG TOTAL) BY MOUTH DAILY. 180 tablet 0   ?? gabapentin (NEURONTIN) 400 MG capsule Take 1 capsule (400 mg total) by mouth 3 (three) times daily. 270 capsule 3  ?? ibuprofen (ADVIL,MOTRIN) 800 MG tablet Take 800 mg by mouth 3 (three) times daily.    ?? meloxicam (MOBIC) 15 MG tablet TAKE 1 TABLET BY MOUTH EVERY DAY 90 tablet 3  ?? niacin (NIASPAN) 500 MG CR tablet Take 1 tablet (500 mg total) by mouth daily with breakfast. (Patient taking differently: Take 500 mg by mouth at bedtime.) 90 tablet 3  ?? olmesartan (BENICAR) 20 MG tablet Take 0.5 tablets (10 mg total) by mouth in the morning and at bedtime. 90 tablet 3  ?? omega-3 acid ethyl esters (LOVAZA) 1 g capsule Take 2 capsules (2 g total) by mouth 2 (two) times daily. 360 capsule 3  ?? PHENObarbital (LUMINAL) 100 MG tablet Take 1 tablet (100 mg total) by mouth at bedtime. 90 tablet 0  ?? phenobarbital (LUMINAL) 16.2 MG tablet Take 2 tablets (32.4 mg total) by mouth at bedtime. 180 tablet 0  ?? rosuvastatin (CRESTOR) 10 MG tablet TAKE 1 TABLET BY MOUTH EVERY DAY 90 tablet 3  ?? sildenafil (REVATIO) 20 MG tablet TAKE 1 TO 4 TABLETS BY  MOUTH ONCE DAILY AS NEEDED 30 tablet 3  ? ? ?Results for orders placed or performed during the hospital encounter of 05/16/21 (from the past 48 hour(s))  ?SARS CORONAVIRUS 2 (TAT 6-24 HRS) Nasopharyngeal Nasopharyngeal Swab     Status: None  ? Collection Time: 05/16/21 12:35 PM  ? Specimen: Nasopharyngeal Swab  ?Result Value Ref Range  ? SARS Coronavirus 2 NEGATIVE NEGATIVE  ?  Comment: (NOTE) ?SARS-CoV-2 target nucleic acids are NOT DETECTED. ? ?The SARS-CoV-2 RNA is generally detectable in upper and lower ?respiratory specimens during the acute phase of infection. Negative ?results do not preclude SARS-CoV-2 infection, do not rule out ?co-infections with other pathogens, and should not be used as the ?sole basis for treatment or other patient management decisions. ?Negative results must be combined with clinical observations, ?patient history, and epidemiological  information. The expected ?result is Negative. ? ?Fact Sheet for Patients: ?SugarRoll.be ? ?Fact Sheet for Healthcare Providers: ?https://www.woods-mathews.com/ ? ?This test is not yet approved or cleared by the [place] FDA and  ?has been authorized for detection and/or diagnosis of SARS-CoV-2 by ?FDA under an Emergency Use Authorization (EUA). This EUA will remain  ?in effect (meaning this test can be used) for the duration of the ?COVID-19 declaration under Se ction 564(b)(1) of the Act, 21 U.S.C. ?section 360bbb-3(b)(1), unless the authorization is terminated or ?revoked sooner. ? ?Performed at [facility], [place] [address], [place], [place] ?[address] ?  ? ?No results found. ? ?Review of Systems  ?Constitutional:  Negative for chills and fever.  ?All other systems reviewed and are negative. ? ?Blood pressure (!) 143/88, pulse 81, temperature 98 ?F (36.7 ?C), temperature source Oral, resp. rate 18, SpO2 100 %. ?Physical Exam ?Vitals reviewed.  ?HENT:  ?   Head: Normocephalic.  ?   Nose: Nose normal.  ?Eyes:  ?   Pupils: Pupils are equal, round, and reactive to light.  ?Cardiovascular:  ?   Rate and Rhythm: Normal rate.  ?Abdominal:  ?   General: Abdomen is flat.  ?   Comments: Stable truncal obesity.   ?Genitourinary: ?   Comments: No CVAT ?Musculoskeletal:  ?   Cervical back: Normal range of motion.  ?Skin: ?   General: Skin is warm.  ?Neurological:  ?   General: No focal deficit present.  ?   Mental Status: He is alert.  ?Psychiatric:     ?   Mood and Affect: Mood normal.  ?  ? ?Assessment/Plan ? ?Proceed as planned with robotic prostatectomy, node dissection. Risks, benefits, alternatives, expected peri-op course discussed previously and reiterated today.  ? ?Alexis Frock, MD ?05/18/2021, 7:54 AM ? ? ? ?

## 2021-05-18 NOTE — Anesthesia Postprocedure Evaluation (Signed)
Anesthesia Post Note ? ?Patient: Christopher Salas ? ?Procedure(s) Performed: XI ROBOTIC ASSISTED LAPAROSCOPIC RADICAL PROSTATECTOMY WITH INDOCYANINE GREEN DYE INJECTION ?LYMPH NODE DISSECTION (Bilateral) ? ?  ? ?Patient location during evaluation: PACU ?Anesthesia Type: General ?Level of consciousness: awake and alert ?Pain management: pain level controlled ?Vital Signs Assessment: post-procedure vital signs reviewed and stable ?Respiratory status: spontaneous breathing, nonlabored ventilation, respiratory function stable and patient connected to nasal cannula oxygen ?Cardiovascular status: blood pressure returned to baseline and stable ?Postop Assessment: no apparent nausea or vomiting ?Anesthetic complications: no ? ? ?No notable events documented. ? ?Last Vitals:  ?Vitals:  ? 05/18/21 1400 05/18/21 1500  ?BP: (!) 144/83 (!) 143/73  ?Pulse: 73 80  ?Resp: 12 14  ?Temp: 36.5 ?C   ?SpO2: 98% 98%  ?  ?Last Pain:  ?Vitals:  ? 05/18/21 1500  ?TempSrc:   ?PainSc: Asleep  ? ? ?  ?  ?  ?  ?  ?  ? ?Jasiah Buntin L Peggy Loge ? ? ? ? ?

## 2021-05-18 NOTE — Plan of Care (Signed)

## 2021-05-18 NOTE — Brief Op Note (Signed)
05/18/2021 ? ?11:01 AM ? ?PATIENT:  Christopher Salas  56 y.o. male ? ?PRE-OPERATIVE DIAGNOSIS:  PROSTATE CANCER, HIGH RISK ? ?POST-OPERATIVE DIAGNOSIS:  PROSTATE CANCER ? ?PROCEDURE:  Procedure(s) with comments: ?XI ROBOTIC ASSISTED LAPAROSCOPIC RADICAL PROSTATECTOMY WITH INDOCYANINE GREEN DYE INJECTION (N/A) - 3 HRS ?LYMPH NODE DISSECTION (Bilateral) ? ?SURGEON:  Surgeon(s) and Role: ?   Alexis Frock, MD - Primary ? ?PHYSICIAN ASSISTANT:  ? ?ASSISTANTS: Debbrah Alar PA  ? ?ANESTHESIA:   local and general ? ?EBL:  50 mL  ? ?BLOOD ADMINISTERED:none ? ?DRAINS:  1 - JP to bulb; 2- Foley to gravity   ? ?LOCAL MEDICATIONS USED:  MARCAINE    ? ?SPECIMEN:  Source of Specimen:  1 - periprostatic fat; 2 -pelvic lymph nodes; 3 - radiacal prostatecotmy ? ?DISPOSITION OF SPECIMEN:  PATHOLOGY ? ?COUNTS:  YES ? ?TOURNIQUET:  * No tourniquets in log * ? ?DICTATION: .Other Dictation: Dictation Number [id number] ? ?PLAN OF CARE: Admit for overnight observation ? ?PATIENT DISPOSITION:  PACU - hemodynamically stable. ?  ?Delay start of Pharmacological VTE agent (>24hrs) due to surgical blood loss or risk of bleeding: yes ? ?

## 2021-05-18 NOTE — Transfer of Care (Signed)
Immediate Anesthesia Transfer of Care Note ? ?Patient: Christopher Salas ? ?Procedure(s) Performed: Procedure(s) with comments: ?XI ROBOTIC ASSISTED LAPAROSCOPIC RADICAL PROSTATECTOMY WITH INDOCYANINE GREEN DYE INJECTION (N/A) - 3 HRS ?LYMPH NODE DISSECTION (Bilateral) ? ?Patient Location: PACU ? ?Anesthesia Type:General ? ?Level of Consciousness: Alert, Awake, Oriented ? ?Airway & Oxygen Therapy: Patient Spontanous Breathing ? ?Post-op Assessment: Report given to RN ? ?Post vital signs: Reviewed and stable ? ?Last Vitals:  ?Vitals:  ? 05/18/21 0742  ?BP: (!) 143/88  ?Pulse: 81  ?Resp: 18  ?Temp: 36.7 ?C  ?SpO2: 100%  ? ? ?Complications: No apparent anesthesia complications ? ?

## 2021-05-18 NOTE — Anesthesia Procedure Notes (Signed)
Procedure Name: Intubation ?Date/Time: 05/18/2021 8:47 AM ?Performed by: Gerald Leitz, CRNA ?Pre-anesthesia Checklist: Patient identified, Patient being monitored, Timeout performed, Emergency Drugs available and Suction available ?Patient Re-evaluated:Patient Re-evaluated prior to induction ?Oxygen Delivery Method: Circle system utilized ?Preoxygenation: Pre-oxygenation with 100% oxygen ?Induction Type: IV induction ?Ventilation: Mask ventilation without difficulty ?Laryngoscope Size: Mac and 3 ?Grade View: Grade I ?Tube type: Oral ?Tube size: 8.0 mm ?Number of attempts: 1 ?Airway Equipment and Method: Stylet ?Placement Confirmation: ETT inserted through vocal cords under direct vision, positive ETCO2 and breath sounds checked- equal and bilateral ?Secured at: 22 cm ?Tube secured with: Tape ?Dental Injury: Teeth and Oropharynx as per pre-operative assessment  ? ? ? ? ?

## 2021-05-18 NOTE — Op Note (Signed)
NAMECYLIS, AYARS MEDICAL RECORD NO: [id number] ACCOUNT NO: [id number] DATE OF BIRTH: 03-29-1965 FACILITY: [facility] LOCATION: [facility] PHYSICIAN: Alexis Frock, MD  Operative Report   DATE OF PROCEDURE: 05/18/2021  PREOPERATIVE DIAGNOSIS:  High risk prostate cancer.  PROCEDURE:   1.  Robotic-assisted laparoscopic radical prostatectomy. 2.  Bilateral pelvic lymphadenectomy. 3.  Injection of indocyanine green dye for sentinel lymphangiography.  ESTIMATED BLOOD LOSS:  50 mL.  ASSISTANT:  Debbrah Alar, PA.  FINDINGS:   1.  Relatively desmoplastic planes around the prostate, especially posteriorly consistent with known ____ disease. 2.  Sentinel lymph nodes seen and noted on pathology acquisition.  INDICATIONS:  The patient is a pleasant 56 year old man who was found on workup to have extremely elevated PSA, to have large volume high risk adenocarcinoma of the prostate.  Staging imaging was clinically localized.  No obvious metastatic disease.   Options were discussed for management including curative and noncurative modes and the patient adamantly wished to proceed with curative radical prostatectomy.  He does understand given his very high risk disease that he very well may require a  multimodal approach.  Informed consent was obtained and placed in medical record.  PROCEDURE IN DETAIL:  The patient is being ____ confirmed.  Procedure timeout was performed.  Intravenous antibiotics were administered.  General endotracheal anesthesia induced.  The patient was placed into a low lithotomy position.  Sterile field was  created, prepping and draping the base of his penis, perineum and proximal thigh using iodine and his infra-xiphoid abdomen using chlorhexidine gluconate. He was further fashioned to operating table using 3-inch tape over foam padding across the supraxiphoid chest.   His arms were tucked to the side with gel rolls.  A test of steep Trendelenburg positioning was performed and  was found to be suitably positioned.  Next, a Foley catheter was placed free to straight drain and a high flow, low-pressure pneumoperitoneum  was obtained using Veress technique in the supraumbilical midline, having passed the aspiration and drop test.  An 8 mm robotic camera port was then placed in same location.  Laparoscopic examination of peritoneal cavity revealed no significant adhesions  or visceral injury.  Some ports were placed as follows:  Right paramedian 8 mm robotic port, right far lateral 12 mm AirSeal assist port, right paramedian 5 mm suction port, left paramedian 8 mm robotic port, left far lateral 8 mm robotic port.  Robot  was docked and passed the electronic checks.  Initial attention was directed at development of space of Retzius.  Incision was made lateral to the right median umbilical ligament from the midline towards the internal ring coursing along the iliac vessels  towards the area of the right ureter.  The right vas deferens was encountered, ligated and used as medial bucket handle and the right bladder wall swept away from the pelvic sidewall towards the area of the endopelvic fascia on the right side.  A mirror  image dissection was performed on the left side.  Anterior attachment was taken down with cautery scissors to expose the anterior base of the prostate, which was defatted to allow better visualization of the bladder neck prostate junction and this fat  was set aside and labeled as periprosthetic fat.  Next, 0.2 mL of indocyanine green dye was injected in each lobe of the prostate using a percutaneously placed robotically guided spinal needle with intervening suctioning to prevent dye spillage, resulted  in excellent parenchymal uptake of the prostate with ICG dye.  Next,  the endopelvic fascia was swept away from the lateral aspect of the prostate and base to apex orientation on both sides.  This exposed the dorsal venous complex, it was very carefully  controlled  using green load stapler.  Taking exquisite care to avoid membranous urethral injury, which did not occur.  Attention was then directed at the dissection.  The pelvis was inspected under near infrared fluorescence light and again, there was  excellent parenchymal uptake of the prostate and multiple lymphatic channels seen coursing towards at the lymph node fields bilaterally.  Attention was directed at the right side.  The right external group dissected free with the boundaries being right  external iliac artery, vein, pelvic sidewall, iliac bifurcation.  Lymphostasis was achieved with cold clips, set aside, and labeled as right external iliac lymph nodes.  Next, right obturator group was dissected free with the boundaries being right  external iliac vein, pelvic sidewall, obturator nerve.  Lymphostasis achieved with cold clips, set aside and labeled as right obturator lymph nodes.  The right obturator nerve was inspected following the maneuvers and found to be uninjured.  A mirror  image lymphadenectomy was performed on the left side of the left external iliac and left obturator groups respectively.  Again, there were several pockets that did contain sentinel lymph nodes.  These were denoted on the pathology requisition and  bilateral obturator nerves were inspected and uninjured.  Attention was then directed at bladder neck dissection.  The bladder was identified moving the Foley catheter back and forth and a lateral release was performed on each side to better denote the  bladder neck prostate junction caliber and the bladder neck was separated from the prostate in anterior and posterior direction given what appeared to be a rim of circular muscle fibers with each plane of dissection.  I was quite happy with the bladder  neck caliber, which was quite small.  There was essentially complete preservation of the bladder neck.  Next, posterior dissection was performed by incising approximately 7 mm inferior  posterior to the posterior lip of the prostate entering the plane of  Denonvilliers.  Bilateral vas deferens were encountered, dissected for a distance of approximately 4 cm, ligated and placed on gentle superior traction.  Bilateral seminal vesicles were dissected to their tip and placed on gentle superior traction.   Dissection was then chosen in a plane slightly inferior to this purposely in the area of the prerectal fat, following a purposeful wide dissection posteriorly was carried down to the area of the apex.  This exposed vascular pedicles on each side, which  were controlled using a sequential clipping technique in a base to apex orientation with a purposely wide non-nerve sparing technique given his very high risk indication.  Prostate was then placed on superior traction and final apical dissection was  performed in the anterior plane transecting the membranous urethra coldly.  This completely freed up his prostate, specimen was placed into an EndoCatch bag for later retrieval.  Next, digital rectal exam was performed using indicator glove under  laparoscopic vision and no evidence of rectal violation was noted.  Posterior reconstruction was performed using a single V-Loc suture reapproximating the posterior urethral plate, the posterior bladder neck, bringing the structures into tension-free  apposition.  Mucosa to mucosa anastomosis was performed using a double-armed 3-0 V-Loc suture from the 6 o'clock and 12 o'clock position, resulting in excellent tension-free apposition.  A new Foley catheter was placed, which irrigated quantitatively.   Sponge  and needle counts were correct.  Hemostasis was excellent.  A closed suction drain was brought to the previous left lateral most robotic port site into the peritoneal cavity.  Robot was then undocked.  The previous right lateral most robotic port  site was closed at the level of the fascia using Carter-Thomason suture passer and 0 Vicryl.  Specimen was  retrieved by extending the previous camera port site superiorly and inferiorly for a total distance of approximately 4 cm, removing the  prostatectomy specimen, setting aside for permanent pathology.  Extraction site was closed at level of fascia using figure-of-eight PDS x4 followed by reapproximation of Scarpa's with running Vicryl.  All incision sites were infiltrated with dilute  lipolyzed Marcaine and closed at the level of the skin using subcuticular Monocryl followed by Dermabond.  Procedure was terminated.  The patient tolerated the procedure well, no immediate perioperative complications.  The patient was taken to  postanesthesia care in stable condition.  Plan for observation admission.  Please note, first assistant,  Debbrah Alar, was crucial for all portions of the surgery today.  She provided invaluable retraction, suctioning, specimen manipulation, vascular stapling, lymphatic clipping and general first assistance.   CHR D: 05/18/2021 11:09:57 am T: 05/18/2021 2:44:00 pm  JOB: [id number]/ [id number]

## 2021-05-18 NOTE — Discharge Instructions (Addendum)
1- Drain Sites - You may have some mild persistent drainage from old drain site for several days, this is normal. This can be covered with cotton gauze for convenience. ? ?2 - Stiches - Your stitches are all dissolvable. You may notice a "loose thread" at your incisions, these are normal and require no intervention. You may cut them flush to the skin with fingernail clippers if needed for comfort. ? ?3 - Diet - No restrictions ? ?4 - Activity - No heavy lifting / straining (any activities that require valsalva or "bearing down") x 4 weeks. Otherwise, no restrictions. ? ?5 - Bathing - You may shower immediately. Do not take a bath or get into swimming pool where incision sites are submersed in water x 4 weeks.  ? ?6 - Catheter - Will remain in place until removed at your next appointment. It may be cleaned with soap and water in the shower. It may be disconnected from the drain bag while in the shower to avoid tripping over the tube. You may apply Neosporin or Vaseline ointment as needed to the tip of the penis where the catheter inserts to reduce friction and irritation in this spot.  ? ?7 - When to Call the Doctor - Call MD for any fever >102, any acute wound problems, or any severe nausea / vomiting. You can call the [facility] [phone]) 24 hours a day 365 days a year. It will roll-over to the answering service and on-call physician after hours. ? ? ? ?You may resume aspirin, advil, aleve, vitamins, supplements, and mobic 7 days after surgery. ?

## 2021-05-19 ENCOUNTER — Encounter (HOSPITAL_COMMUNITY): Payer: Self-pay | Admitting: Urology

## 2021-05-19 DIAGNOSIS — C775 Secondary and unspecified malignant neoplasm of intrapelvic lymph nodes: Secondary | ICD-10-CM | POA: Diagnosis not present

## 2021-05-19 DIAGNOSIS — I2510 Atherosclerotic heart disease of native coronary artery without angina pectoris: Secondary | ICD-10-CM | POA: Diagnosis not present

## 2021-05-19 DIAGNOSIS — I10 Essential (primary) hypertension: Secondary | ICD-10-CM | POA: Diagnosis not present

## 2021-05-19 DIAGNOSIS — Z7982 Long term (current) use of aspirin: Secondary | ICD-10-CM | POA: Diagnosis not present

## 2021-05-19 DIAGNOSIS — Z79899 Other long term (current) drug therapy: Secondary | ICD-10-CM | POA: Diagnosis not present

## 2021-05-19 DIAGNOSIS — C61 Malignant neoplasm of prostate: Secondary | ICD-10-CM | POA: Diagnosis not present

## 2021-05-19 LAB — BASIC METABOLIC PANEL
Anion gap: 5 (ref 5–15)
BUN: 14 mg/dL (ref 6–20)
CO2: 28 mmol/L (ref 22–32)
Calcium: 8.6 mg/dL — ABNORMAL LOW (ref 8.9–10.3)
Chloride: 105 mmol/L (ref 98–111)
Creatinine, Ser: 1.2 mg/dL (ref 0.61–1.24)
GFR, Estimated: >60 mL/min (ref >60)
Glucose, Bld: 116 mg/dL — ABNORMAL HIGH (ref 70–99)
Potassium: 4.3 mmol/L (ref 3.5–5.1)
Sodium: 138 mmol/L (ref 135–145)

## 2021-05-19 LAB — CREATININE, FLUID (PLEURAL, PERITONEAL, JP DRAINAGE): Creat, Fluid: 1.1 mg/dL

## 2021-05-19 LAB — HEMOGLOBIN AND HEMATOCRIT, BLOOD
HCT: 35 % — ABNORMAL LOW (ref 39.0–52.0)
Hemoglobin: 12 g/dL — ABNORMAL LOW (ref 13.0–17.0)

## 2021-05-19 NOTE — Progress Notes (Signed)
1 Day Post-Op  ? ?Subjective/Chief Complaint: ? ?1 - Prostate Cancer - s/p robotic prostatectomy with node dissection 05/18/2021. Path penidng. Hgb 12 3/2. ? ?Today " Christopher Salas " is stable. Pain manageable. Has ambulated some but not much. ? ? ?Objective: ?Vital signs in last 24 hours: ?Temp:  [97.5 ?F (36.4 ?C)-98.5 ?F (36.9 ?C)] 97.9 ?F (36.6 ?C) (03/02 1240) ?Pulse Rate:  [66-86] 78 (03/02 1240) ?Resp:  [12-20] 18 (03/02 1240) ?BP: (107-144)/(56-83) 116/63 (03/02 1240) ?SpO2:  [95 %-99 %] 96 % (03/02 1240) ?Last BM Date : 05/17/21 ? ?Intake/Output from previous day: ?03/01 0701 - 03/02 0700 ?In: 2452.5 [P.O.:240; I.V.:2162.5; IV Piggyback:50] ?Out: 3907 [Urine:3700; Drains:157; Blood:50] ?Intake/Output this shift: ?Total I/O ?In: 240 [P.O.:240] ?Out: 950 [Urine:950] ? ?EXAM: ?NAD, AOx3, sister at bedside ?Non-labored breathing on minimal [place] O2 ?Stable truncal obesity, recent scars w/o hernias. JP with serosanguinous output. ?No c/c/e ? ? ?Lab Results:  ?Recent Labs  ?  05/18/21 ?1126 05/19/21 ?0449  ?HGB 14.1 12.0*  ?HCT 41.7 35.0*  ? ?BMET ?Recent Labs  ?  05/19/21 ?0449  ?NA 138  ?K 4.3  ?CL 105  ?CO2 28  ?GLUCOSE 116*  ?BUN 14  ?CREATININE 1.20  ?CALCIUM 8.6*  ? ?PT/INR ?No results for input(s): LABPROT, INR in the last 72 hours. ?ABG ?No results for input(s): PHART, HCO3 in the last 72 hours. ? ?Invalid input(s): PCO2, PO2 ? ?Studies/Results: ?No results found. ? ?Anti-infectives: ?Anti-infectives (From admission, onward)  ? ? Start     Dose/Rate Route Frequency Ordered Stop  ? 05/18/21 0714  ceFAZolin (ANCEF) IVPB 3g/100 mL premix  Status:  Discontinued       ? 3 g ?200 mL/hr over 30 Minutes Intravenous 30 min pre-op 05/18/21 0714 05/18/21 1555  ? 05/18/21 0000  sulfamethoxazole-trimethoprim (BACTRIM DS) 800-160 MG tablet       ? 1 tablet Oral 2 times daily 05/18/21 1105    ? ?  ? ? ?Assessment/Plan: ? ?Goals for DC discused. Likely DC tomorrow AM based on current progress. CHeck JP Cr, Ambulate.  ? ? ?Alexis Frock ?05/19/2021 ? ?

## 2021-05-19 NOTE — Progress Notes (Signed)
?  Transition of Care (TOC) Screening Note ? ? ?Patient Details  ?Name: Christopher Salas ?Date of Birth: 11-Apr-1965 ? ? ?Transition of Care [facility]) CM/SW Contact:    ?Dessa Phi, RN ?Phone Number: ?05/19/2021, 10:25 AM ? ? ? ?Transition of Care Department [facility]) has reviewed patient and no TOC needs have been identified at this time. We will continue to monitor patient advancement through interdisciplinary progression rounds. If new patient transition needs arise, please place a TOC consult. ?  ?

## 2021-05-20 DIAGNOSIS — I10 Essential (primary) hypertension: Secondary | ICD-10-CM | POA: Diagnosis not present

## 2021-05-20 DIAGNOSIS — C775 Secondary and unspecified malignant neoplasm of intrapelvic lymph nodes: Secondary | ICD-10-CM | POA: Diagnosis not present

## 2021-05-20 DIAGNOSIS — C61 Malignant neoplasm of prostate: Secondary | ICD-10-CM | POA: Diagnosis not present

## 2021-05-20 DIAGNOSIS — Z79899 Other long term (current) drug therapy: Secondary | ICD-10-CM | POA: Diagnosis not present

## 2021-05-20 DIAGNOSIS — Z7982 Long term (current) use of aspirin: Secondary | ICD-10-CM | POA: Diagnosis not present

## 2021-05-20 DIAGNOSIS — I2510 Atherosclerotic heart disease of native coronary artery without angina pectoris: Secondary | ICD-10-CM | POA: Diagnosis not present

## 2021-05-20 NOTE — Discharge Summary (Signed)
Physician Discharge Summary  ?Patient ID: ?Christopher Salas ?MRN: [id number] ?DOB/AGE: July 18, 1965 55 y.o. ? ?Admit date: 05/18/2021 ?Discharge date: 05/20/2021 ? ?Admission Diagnoses: Prostate Cancer ? ?Discharge Diagnoses:  ?Principal Problem: ?  Prostate cancer ([place]) ? ? ?Discharged Condition: good ? ?Hospital Course: Pt underwent robotic prostatectomy with node dissection on 05/18/21, the day of admission, without acute complication. He was admitted to the 4th floor Urology service post-op. By the AM of POD 2 he is ambulatory, pain controlled on PO meds, maintaining PO nutrition, and felt to be aequate for discharge. Path pending at discharge. Hgb 12, Cr 1.2. JP removed as output scant and Cr same as serum.  ? ?Consults: None ? ?Significant Diagnostic Studies: labs: as per above ? ?Treatments: surgery: as per above ? ?Discharge Exam: ?Blood pressure 125/73, pulse 97, temperature 99.7 ?F (37.6 ?C), temperature source Oral, resp. rate 20, height 6' (1.829 m), weight 121.6 kg, SpO2 92 %. ?General appearance: alert and cooperative ?Head: Normocephalic, without obvious abnormality, atraumatic ?Eyes: negative ?Nose: Nares normal. Septum midline. Mucosa normal. No drainage or sinus tenderness. ?Throat: lips, mucosa, and tongue normal; teeth and gums normal ?Neck: supple, symmetrical, trachea midline ?Back: symmetric, no curvature. ROM normal. No CVA tenderness. ?Resp: non labored on room air ?Cardio: Nl rate ?GI: stable truncal obesity. Recnet surgical sites c/d/I. JP with scant serosanguinous non-foul output, removed and dry dressing placed.  ?Male genitalia: normal, foley in place with non-foul yellow urine, some scant dried blood at meatus as expected.  ?Extremities: extremities normal, atraumatic, no cyanosis or edema ?Neurologic: Grossly normal ? ?Disposition: Discharge disposition: 01-Home or Self Care ? ? ? ? ? ? ? ?Allergies as of 05/20/2021   ?No Known Allergies ?  ? ?  ?Medication List  ?  ? ?STOP taking these medications    ? ?aspirin 81 MG tablet ?  ?ibuprofen 800 MG tablet ?Commonly known as: ADVIL ?  ?meloxicam 15 MG tablet ?Commonly known as: MOBIC ?  ? ?  ? ?TAKE these medications   ? ?acetaminophen 325 MG tablet ?Commonly known as: TYLENOL ?Take 650 mg by mouth every 6 (six) hours as needed for moderate pain. ?  ?baclofen 10 MG tablet ?Commonly known as: LIORESAL ?Take 1 tablet (10 mg total) by mouth 3 (three) times daily. ?  ?cyclobenzaprine 10 MG tablet ?Commonly known as: FLEXERIL ?Take 10 mg by mouth every 8 (eight) hours as needed for muscle spasms. ?  ?docusate sodium 100 MG capsule ?Commonly known as: COLACE ?Take 1 capsule (100 mg total) by mouth 2 (two) times daily. ?  ?fenofibrate 48 MG tablet ?Commonly known as: TRICOR ?TAKE 2 TABLETS (96 MG TOTAL) BY MOUTH DAILY. ?  ?gabapentin 400 MG capsule ?Commonly known as: NEURONTIN ?Take 1 capsule (400 mg total) by mouth 3 (three) times daily. ?  ?HYDROcodone-acetaminophen 5-325 MG tablet ?Commonly known as: Norco ?Take 1-2 tablets by mouth every 6 (six) hours as needed for moderate pain. ?  ?IRON-150 PO ?Take 1 tablet by mouth daily. ?  ?niacin 500 MG CR tablet ?Commonly known as: NIASPAN ?Take 1 tablet (500 mg total) by mouth daily with breakfast. ?What changed: when to take this ?  ?olmesartan 20 MG tablet ?Commonly known as: BENICAR ?Take 0.5 tablets (10 mg total) by mouth in the morning and at bedtime. ?  ?omega-3 acid ethyl esters 1 g capsule ?Commonly known as: LOVAZA ?Take 2 capsules (2 g total) by mouth 2 (two) times daily. ?  ?PHENObarbital 100 MG tablet ?Commonly known as:  LUMINAL ?Take 1 tablet (100 mg total) by mouth at bedtime. ?  ?phenobarbital 16.2 MG tablet ?Commonly known as: LUMINAL ?Take 2 tablets (32.4 mg total) by mouth at bedtime. ?  ?rosuvastatin 10 MG tablet ?Commonly known as: CRESTOR ?TAKE 1 TABLET BY MOUTH EVERY DAY ?  ?sildenafil 20 MG tablet ?Commonly known as: REVATIO ?TAKE 1 TO 4 TABLETS BY MOUTH ONCE DAILY AS NEEDED ?   ?sulfamethoxazole-trimethoprim 800-160 MG tablet ?Commonly known as: BACTRIM DS ?Take 1 tablet by mouth 2 (two) times daily. Start the day prior to foley removal appointment ?  ? ?  ? ? Follow-up Information   ? ? Alexis Frock, MD Follow up on 05/30/2021.   ?Specialty: Urology ?Why: at 10 AM for MD visit, pathology review, and catheter removal. ?Contact information: ?[address] ?[place] [address] ?[phone] ? ? ?  ?  ? ?  ?  ? ?  ? ? ?Signed: ?Alexis Frock ?05/20/2021, 9:40 AM ? ? ?

## 2021-05-20 NOTE — Plan of Care (Signed)

## 2021-05-20 NOTE — Progress Notes (Signed)
Pt. Discharged via wheelchair, alert and oriented not in distress. Discharge instruction and education discussed patient verbalized understanding. All personal belongings are with the family. ?

## 2021-05-20 NOTE — Progress Notes (Signed)
Patient ambulated up and down the hallway 2 x and had no new complaints. ?

## 2021-05-25 ENCOUNTER — Ambulatory Visit: Payer: Medicare Other | Admitting: Family Medicine

## 2021-05-25 LAB — SURGICAL PATHOLOGY

## 2021-07-27 ENCOUNTER — Other Ambulatory Visit: Payer: Self-pay | Admitting: Family Medicine

## 2021-09-05 ENCOUNTER — Encounter: Payer: Self-pay | Admitting: Family Medicine

## 2021-09-05 ENCOUNTER — Ambulatory Visit (INDEPENDENT_AMBULATORY_CARE_PROVIDER_SITE_OTHER): Payer: Medicare Other | Admitting: Family Medicine

## 2021-09-05 VITALS — BP 143/83 | HR 75 | Temp 97.6°F | Ht 72.0 in | Wt 260.0 lb

## 2021-09-05 DIAGNOSIS — Z79899 Other long term (current) drug therapy: Secondary | ICD-10-CM

## 2021-09-05 DIAGNOSIS — E785 Hyperlipidemia, unspecified: Secondary | ICD-10-CM

## 2021-09-05 DIAGNOSIS — C775 Secondary and unspecified malignant neoplasm of intrapelvic lymph nodes: Secondary | ICD-10-CM | POA: Diagnosis not present

## 2021-09-05 DIAGNOSIS — R569 Unspecified convulsions: Secondary | ICD-10-CM

## 2021-09-05 DIAGNOSIS — I10 Essential (primary) hypertension: Secondary | ICD-10-CM

## 2021-09-05 MED ORDER — PHENOBARBITAL 100 MG PO TABS: 100.0000 mg | ORAL_TABLET | Freq: Every day | ORAL | 0 refills | Status: DC

## 2021-09-05 MED ORDER — NIACIN ER (ANTIHYPERLIPIDEMIC) 500 MG PO TBCR: 500.0000 mg | EXTENDED_RELEASE_TABLET | Freq: Every day | ORAL | 3 refills | Status: DC

## 2021-09-05 MED ORDER — PHENOBARBITAL 16.2 MG PO TABS: 32.4000 mg | ORAL_TABLET | Freq: Every day | ORAL | 0 refills | Status: DC

## 2021-09-05 MED ORDER — FENOFIBRATE 48 MG PO TABS: 96.0000 mg | ORAL_TABLET | Freq: Every day | ORAL | 1 refills | Status: DC

## 2021-09-05 MED ORDER — OMEGA-3-ACID ETHYL ESTERS 1 G PO CAPS: 2.0000 | ORAL_CAPSULE | Freq: Two times a day (BID) | ORAL | 3 refills | Status: DC

## 2021-09-05 MED ORDER — ROSUVASTATIN CALCIUM 10 MG PO TABS: 10.0000 mg | ORAL_TABLET | Freq: Every day | ORAL | 3 refills | Status: DC

## 2021-09-05 MED ORDER — OLMESARTAN MEDOXOMIL 20 MG PO TABS: 10.0000 mg | ORAL_TABLET | Freq: Two times a day (BID) | ORAL | 3 refills | Status: DC

## 2021-09-05 MED ORDER — GABAPENTIN 400 MG PO CAPS: 400.0000 mg | ORAL_CAPSULE | Freq: Three times a day (TID) | ORAL | 3 refills | Status: DC

## 2021-09-05 NOTE — Progress Notes (Signed)
**Note Christopher-Identified via Obfuscation** BP (!) 143/83   Pulse 75   Temp 97.6 F (36.4 C)   Ht 6' (1.829 m)   Wt 260 lb (117.9 kg)   SpO2 97%   BMI 35.26 kg/m    Subjective:   Patient ID: Christopher Salas, male    DOB: 03-Dec-1965, 56 y.o.   MRN: [id number]  HPI: CLEAVEN DEMARIO is a 56 y.o. male presenting on 09/05/2021 for Medical Management of Chronic Issues, Hypertension, and Hyperlipidemia   HPI Hypertension Patient is currently on olmesartan, and their blood pressure today is 143/83. Patient denies any lightheadedness or dizziness. Patient denies headaches, blurred vision, chest pains, shortness of breath, or weakness. Denies any side effects from medication and is content with current medication.   Hyperlipidemia Patient is coming in for recheck of his hyperlipidemia. The patient is currently taking fenofibrate and niacin and Crestor. They deny any issues with myalgias or history of liver damage from it. They deny any focal numbness or weakness or chest pain.   Seizure disorder recheck Current rx-phenobarbital 100 daily and 16.2 twice daily # meds rx-30/month and 60/month Effectiveness of current meds-works well Adverse reactions form meds-none  Pill count performed-No Last drug screen -05/28/2020 ( high risk q69m moderate risk q664mlow risk yearly ) Urine drug screen today- Yes [place]  If yes were their any concerning findings? -None  No flowsheet data found.   Controlled substance contract signed on: Today  Relevant past medical, surgical, family and social history reviewed and updated as indicated. Interim medical history since our last visit reviewed. Allergies and medications reviewed and updated.  Review of Systems  Constitutional:  Negative for chills and fever.  Respiratory:  Negative for shortness of breath and wheezing.   Cardiovascular:  Negative for chest pain and leg swelling.  Musculoskeletal:  Negative for back pain and gait problem.  Skin:  Negative for rash.   Neurological:  Negative for dizziness, weakness and light-headedness.  All other systems reviewed and are negative.   Per HPI unless specifically indicated above   Allergies as of 09/05/2021   No Known Allergies      Medication List        Accurate as of September 05, 2021  3:39 PM. If you have any questions, ask your nurse or doctor.          STOP taking these medications    sulfamethoxazole-trimethoprim 800-160 MG tablet Commonly known as: BACTRIM DS Stopped by: JoFransisca Kaufmannettinger, MD       TAKE these medications    acetaminophen 325 MG tablet Commonly known as: TYLENOL Take 650 mg by mouth every 6 (six) hours as needed for moderate pain.   baclofen 10 MG tablet Commonly known as: LIORESAL Take 1 tablet (10 mg total) by mouth 3 (three) times daily.   cyclobenzaprine 10 MG tablet Commonly known as: FLEXERIL Take 10 mg by mouth every 8 (eight) hours as needed for muscle spasms.   docusate sodium 100 MG capsule Commonly known as: COLACE Take 1 capsule (100 mg total) by mouth 2 (two) times daily.   fenofibrate 48 MG tablet Commonly known as: TRICOR Take 2 tablets (96 mg total) by mouth daily. What changed: additional instructions Changed by: JoFransisca Kaufmannettinger, MD   gabapentin 400 MG capsule Commonly known as: NEURONTIN Take 1 capsule (400 mg total) by mouth 3 (three) times daily.   HYDROcodone-acetaminophen 5-325 MG tablet Commonly known as: Norco Take 1-2 tablets by mouth every 6 (six)  hours as needed for moderate pain.   IRON-150 PO Take 1 tablet by mouth daily.   niacin 500 MG CR tablet Commonly known as: NIASPAN Take 1 tablet (500 mg total) by mouth daily with breakfast. What changed: when to take this   olmesartan 20 MG tablet Commonly known as: BENICAR Take 0.5 tablets (10 mg total) by mouth in the morning and at bedtime.   omega-3 acid ethyl esters 1 g capsule Commonly known as: LOVAZA Take 2 capsules (2 g total) by mouth 2 (two) times  daily.   PHENObarbital 100 MG tablet Commonly known as: LUMINAL Take 1 tablet (100 mg total) by mouth at bedtime.   phenobarbital 16.2 MG tablet Commonly known as: LUMINAL Take 2 tablets (32.4 mg total) by mouth at bedtime.   rosuvastatin 10 MG tablet Commonly known as: CRESTOR Take 1 tablet (10 mg total) by mouth daily.   sildenafil 20 MG tablet Commonly known as: REVATIO TAKE 1 TO 4 TABLETS BY MOUTH ONCE DAILY AS NEEDED         Objective:   BP (!) 143/83   Pulse 75   Temp 97.6 F (36.4 C)   Ht 6' (1.829 m)   Wt 260 lb (117.9 kg)   SpO2 97%   BMI 35.26 kg/m   Wt Readings from Last 3 Encounters:  09/05/21 260 lb (117.9 kg)  05/18/21 268 lb (121.6 kg)  05/09/21 268 lb (121.6 kg)    Physical Exam Vitals and nursing note reviewed.  Constitutional:      General: He is not in acute distress.    Appearance: He is well-developed. He is not diaphoretic.  Eyes:     General: No scleral icterus.    Conjunctiva/sclera: Conjunctivae normal.  Neck:     Thyroid: No thyromegaly.  Cardiovascular:     Rate and Rhythm: Normal rate and regular rhythm.     Heart sounds: Normal heart sounds. No murmur heard. Pulmonary:     Effort: Pulmonary effort is normal. No respiratory distress.     Breath sounds: Normal breath sounds. No wheezing.  Musculoskeletal:        General: No swelling. Normal range of motion.     Cervical back: Neck supple.  Lymphadenopathy:     Cervical: No cervical adenopathy.  Skin:    General: Skin is warm and dry.     Findings: No rash.  Neurological:     Mental Status: He is alert and oriented to person, place, and time.     Coordination: Coordination normal.  Psychiatric:        Behavior: Behavior normal.       Assessment & Plan:   Problem List Items Addressed This Visit       Cardiovascular and Mediastinum   Hypertension - Primary   Relevant Medications   fenofibrate (TRICOR) 48 MG tablet   niacin (NIASPAN) 500 MG CR tablet    olmesartan (BENICAR) 20 MG tablet   omega-3 acid ethyl esters (LOVAZA) 1 g capsule   rosuvastatin (CRESTOR) 10 MG tablet   Other Relevant Orders   CBC with Differential/Platelet   CMP14+EGFR   Lipid panel     Other   Hyperlipidemia   Relevant Medications   fenofibrate (TRICOR) 48 MG tablet   niacin (NIASPAN) 500 MG CR tablet   olmesartan (BENICAR) 20 MG tablet   omega-3 acid ethyl esters (LOVAZA) 1 g capsule   rosuvastatin (CRESTOR) 10 MG tablet   Other Relevant Orders   CBC with Differential/Platelet  CMP14+EGFR   Lipid panel   ToxASSURE Select 13 (MW), Urine   Seizures (HCC)   Relevant Medications   gabapentin (NEURONTIN) 400 MG capsule   PHENObarbital (LUMINAL) 100 MG tablet   phenobarbital (LUMINAL) 16.2 MG tablet   Other Relevant Orders   CBC with Differential/Platelet   CMP14+EGFR   Lipid panel   Other Visit Diagnoses     Controlled substance agreement signed           Continue current medicine, seems to be doing well.  No changes.  He is seeing urology for prostate cancer and will follow-up with them.  He also has a cardiologist for his arrhythmias. Follow up plan: Return in about 3 months (around 12/06/2021), or if symptoms worsen or fail to improve, for Seizure disorder and hypertension and cholesterol.  Counseling provided for all of the vaccine components Orders Placed This Encounter  Procedures   CBC with Differential/Platelet   CMP14+EGFR   Lipid panel   ToxASSURE Select 13 (MW), Urine    Caryl Pina, MD [facility] 09/05/2021, 3:39 PM

## 2021-09-05 NOTE — Addendum Note (Signed)
Addended by: Alphonzo Dublin on: 09/05/2021 03:47 PM   Modules accepted: Orders

## 2021-09-06 LAB — CMP14+EGFR
ALT: 24 IU/L (ref 0–44)
AST: 26 IU/L (ref 0–40)
Albumin/Globulin Ratio: 2 (ref 1.2–2.2)
Albumin: 4.7 g/dL (ref 3.8–4.9)
Alkaline Phosphatase: 99 IU/L (ref 44–121)
BUN/Creatinine Ratio: 13 (ref 9–20)
BUN: 13 mg/dL (ref 6–24)
Bilirubin Total: 0.2 mg/dL (ref 0.0–1.2)
CO2: 22 mmol/L (ref 20–29)
Calcium: 9.6 mg/dL (ref 8.7–10.2)
Chloride: 106 mmol/L (ref 96–106)
Creatinine, Ser: 0.99 mg/dL (ref 0.76–1.27)
Globulin, Total: 2.3 g/dL (ref 1.5–4.5)
Glucose: 124 mg/dL — ABNORMAL HIGH (ref 70–99)
Potassium: 4.1 mmol/L (ref 3.5–5.2)
Sodium: 145 mmol/L — ABNORMAL HIGH (ref 134–144)
Total Protein: 7 g/dL (ref 6.0–8.5)
eGFR: 88 mL/min/{1.73_m2} (ref >59)

## 2021-09-06 LAB — CBC WITH DIFFERENTIAL/PLATELET
Basophils Absolute: 0 10*3/uL (ref 0.0–0.2)
Basos: 0 %
EOS (ABSOLUTE): 0.1 10*3/uL (ref 0.0–0.4)
Eos: 2 %
Hematocrit: 42.6 % (ref 37.5–51.0)
Hemoglobin: 15 g/dL (ref 13.0–17.7)
Immature Grans (Abs): 0 10*3/uL (ref 0.0–0.1)
Immature Granulocytes: 0 %
Lymphocytes Absolute: 1.3 10*3/uL (ref 0.7–3.1)
Lymphs: 28 %
MCH: 31.9 pg (ref 26.6–33.0)
MCHC: 35.2 g/dL (ref 31.5–35.7)
MCV: 91 fL (ref 79–97)
Monocytes Absolute: 0.2 10*3/uL (ref 0.1–0.9)
Monocytes: 5 %
Neutrophils Absolute: 2.9 10*3/uL (ref 1.4–7.0)
Neutrophils: 65 %
Platelets: 224 10*3/uL (ref 150–450)
RBC: 4.7 x10E6/uL (ref 4.14–5.80)
RDW: 13.3 % (ref 11.6–15.4)
WBC: 4.6 10*3/uL (ref 3.4–10.8)

## 2021-09-06 LAB — LIPID PANEL
Chol/HDL Ratio: 4.7 ratio (ref 0.0–5.0)
Cholesterol, Total: 186 mg/dL (ref 100–199)
HDL: 40 mg/dL (ref >39)
LDL Chol Calc (NIH): 79 mg/dL (ref 0–99)
Triglycerides: 419 mg/dL — ABNORMAL HIGH (ref 0–149)
VLDL Cholesterol Cal: 67 mg/dL — ABNORMAL HIGH (ref 5–40)

## 2021-09-12 DIAGNOSIS — M62838 Other muscle spasm: Secondary | ICD-10-CM | POA: Diagnosis not present

## 2021-09-12 DIAGNOSIS — M6281 Muscle weakness (generalized): Secondary | ICD-10-CM | POA: Diagnosis not present

## 2021-09-12 DIAGNOSIS — R350 Frequency of micturition: Secondary | ICD-10-CM | POA: Diagnosis not present

## 2021-09-19 DIAGNOSIS — M62838 Other muscle spasm: Secondary | ICD-10-CM | POA: Diagnosis not present

## 2021-09-19 DIAGNOSIS — R350 Frequency of micturition: Secondary | ICD-10-CM | POA: Diagnosis not present

## 2021-09-19 DIAGNOSIS — M6281 Muscle weakness (generalized): Secondary | ICD-10-CM | POA: Diagnosis not present

## 2021-09-21 LAB — BARBITURATES,MS,WB/SP RFX
Amobarbital: NEGATIVE ug/mL
Barbiturates Confirmation: POSITIVE
Butabarbital: NEGATIVE ug/mL
Butalbital: NEGATIVE ug/mL
Pentobarbital: NEGATIVE ug/mL
Phenobarbital: 12 ug/mL
Secobarbital: NEGATIVE ug/mL

## 2021-09-21 LAB — DRUG SCREEN 10 W/CONF, SERUM
Amphetamines, IA: NEGATIVE ng/mL
Barbiturates, IA: POSITIVE ug/mL — AB
Benzodiazepines, IA: NEGATIVE ng/mL
Cocaine & Metabolite, IA: NEGATIVE ng/mL
Methadone, IA: NEGATIVE ng/mL
Opiates, IA: NEGATIVE ng/mL
Oxycodones, IA: NEGATIVE ng/mL
Phencyclidine, IA: NEGATIVE ng/mL
Propoxyphene, IA: NEGATIVE ng/mL
THC(Marijuana) Metabolite, IA: NEGATIVE ng/mL

## 2021-09-28 DIAGNOSIS — M6281 Muscle weakness (generalized): Secondary | ICD-10-CM | POA: Diagnosis not present

## 2021-09-28 DIAGNOSIS — M62838 Other muscle spasm: Secondary | ICD-10-CM | POA: Diagnosis not present

## 2021-09-28 DIAGNOSIS — R350 Frequency of micturition: Secondary | ICD-10-CM | POA: Diagnosis not present

## 2021-09-30 ENCOUNTER — Other Ambulatory Visit: Payer: Self-pay | Admitting: Family Medicine

## 2021-10-07 DIAGNOSIS — M6281 Muscle weakness (generalized): Secondary | ICD-10-CM | POA: Diagnosis not present

## 2021-10-07 DIAGNOSIS — M62838 Other muscle spasm: Secondary | ICD-10-CM | POA: Diagnosis not present

## 2021-10-24 ENCOUNTER — Other Ambulatory Visit: Payer: Self-pay | Admitting: Family Medicine

## 2021-10-28 DIAGNOSIS — M62838 Other muscle spasm: Secondary | ICD-10-CM | POA: Diagnosis not present

## 2021-10-28 DIAGNOSIS — M6281 Muscle weakness (generalized): Secondary | ICD-10-CM | POA: Diagnosis not present

## 2021-11-05 ENCOUNTER — Other Ambulatory Visit: Payer: Self-pay | Admitting: Family Medicine

## 2021-11-14 DIAGNOSIS — M6281 Muscle weakness (generalized): Secondary | ICD-10-CM | POA: Diagnosis not present

## 2021-11-14 DIAGNOSIS — M62838 Other muscle spasm: Secondary | ICD-10-CM | POA: Diagnosis not present

## 2021-11-22 ENCOUNTER — Other Ambulatory Visit: Payer: Self-pay | Admitting: Family Medicine

## 2021-11-22 DIAGNOSIS — C775 Secondary and unspecified malignant neoplasm of intrapelvic lymph nodes: Secondary | ICD-10-CM | POA: Diagnosis not present

## 2021-12-05 DIAGNOSIS — Z5111 Encounter for antineoplastic chemotherapy: Secondary | ICD-10-CM | POA: Diagnosis not present

## 2021-12-07 ENCOUNTER — Encounter: Payer: Self-pay | Admitting: Family Medicine

## 2021-12-07 ENCOUNTER — Ambulatory Visit (INDEPENDENT_AMBULATORY_CARE_PROVIDER_SITE_OTHER): Payer: Medicare Other | Admitting: Family Medicine

## 2021-12-07 VITALS — BP 154/91 | HR 94 | Temp 98.2°F | Ht 72.0 in | Wt 260.0 lb

## 2021-12-07 DIAGNOSIS — R569 Unspecified convulsions: Secondary | ICD-10-CM | POA: Diagnosis not present

## 2021-12-07 DIAGNOSIS — I10 Essential (primary) hypertension: Secondary | ICD-10-CM

## 2021-12-07 DIAGNOSIS — R195 Other fecal abnormalities: Secondary | ICD-10-CM | POA: Diagnosis not present

## 2021-12-07 DIAGNOSIS — E785 Hyperlipidemia, unspecified: Secondary | ICD-10-CM

## 2021-12-07 MED ORDER — OLMESARTAN MEDOXOMIL 20 MG PO TABS: 10.0000 mg | ORAL_TABLET | Freq: Two times a day (BID) | ORAL | 1 refills | Status: DC

## 2021-12-07 MED ORDER — PHENOBARBITAL 100 MG PO TABS: 100.0000 mg | ORAL_TABLET | Freq: Every day | ORAL | 0 refills | Status: DC

## 2021-12-07 MED ORDER — BACLOFEN 10 MG PO TABS
10.0000 mg | ORAL_TABLET | Freq: Three times a day (TID) | ORAL | 5 refills | Status: DC
Start: 2021-12-07 — End: 2021-12-08

## 2021-12-07 MED ORDER — PHENOBARBITAL 16.2 MG PO TABS
32.4000 mg | ORAL_TABLET | Freq: Every day | ORAL | 0 refills | Status: DC
Start: 2021-12-07 — End: 2022-02-17

## 2021-12-07 MED ORDER — SILDENAFIL CITRATE 20 MG PO TABS: ORAL_TABLET | ORAL | 3 refills | Status: DC

## 2021-12-07 NOTE — Progress Notes (Signed)
BP (!) 154/91   Pulse 94   Temp 98.2 F (36.8 C)   Ht 6' (1.829 m)   Wt 260 lb (117.9 kg)   SpO2 97%   BMI 35.26 kg/m    Subjective:   Patient ID: Christopher Salas, male    DOB: January 16, 1966, 56 y.o.   MRN: [id number]  HPI: Christopher Salas is a 56 y.o. male presenting on 12/07/2021 for Medical Management of Chronic Issues, Hypertension, Hyperlipidemia, and Seizures   HPI Seizure disorder Current rx-phenobarbital 132.4 mg daily # meds rx-90-day supply of both Effectiveness of current meds-works well Adverse reactions form meds-none  Pill count performed-No Last drug screen -09/13/2021 ( high risk q66m moderate risk q628mlow risk yearly ) Urine drug screen today- No [place]  If yes were their any concerning findings? -None  No flowsheet data found.   Controlled substance contract signed on: 09/13/2021  Hypertension Patient is currently on olmesartan, and their blood pressure today is 154/91 and 129/76. Patient denies any lightheadedness or dizziness. Patient denies headaches, blurred vision, chest pains, shortness of breath, or weakness. Denies any side effects from medication and is content with current medication.   Hyperlipidemia Patient is coming in for recheck of his hyperlipidemia. The patient is currently taking fish oil and Crestor. They deny any issues with myalgias or history of liver damage from it. They deny any focal numbness or weakness or chest pain.   Relevant past medical, surgical, family and social history reviewed and updated as indicated. Interim medical history since our last visit reviewed. Allergies and medications reviewed and updated.  Review of Systems  Constitutional:  Positive for fatigue (From prostate cancer treatment). Negative for chills and fever.  Respiratory:  Negative for shortness of breath and wheezing.   Cardiovascular:  Negative for chest pain and leg swelling.  Musculoskeletal:  Negative for back pain and gait  problem.  Skin:  Negative for rash.  All other systems reviewed and are negative.   Per HPI unless specifically indicated above   Allergies as of 12/07/2021   No Known Allergies      Medication List        Accurate as of December 07, 2021  2:31 PM. If you have any questions, ask your nurse or doctor.          acetaminophen 325 MG tablet Commonly known as: TYLENOL Take 650 mg by mouth every 6 (six) hours as needed for moderate pain.   baclofen 10 MG tablet Commonly known as: LIORESAL Take 1 tablet (10 mg total) by mouth 3 (three) times daily.   cyclobenzaprine 10 MG tablet Commonly known as: FLEXERIL Take 10 mg by mouth every 8 (eight) hours as needed for muscle spasms.   docusate sodium 100 MG capsule Commonly known as: COLACE Take 1 capsule (100 mg total) by mouth 2 (two) times daily.   fenofibrate 48 MG tablet Commonly known as: TRICOR TAKE 2 TABLETS (96 MG TOTAL) BY MOUTH DAILY.   gabapentin 400 MG capsule Commonly known as: NEURONTIN Take 1 capsule (400 mg total) by mouth 3 (three) times daily.   HYDROcodone-acetaminophen 5-325 MG tablet Commonly known as: Norco Take 1-2 tablets by mouth every 6 (six) hours as needed for moderate pain.   IRON-150 PO Take 1 tablet by mouth daily.   niacin 500 MG ER tablet Commonly known as: VITAMIN B3 Take 1 tablet (500 mg total) by mouth daily with breakfast.   olmesartan 20 MG tablet Commonly known  asCephus Richer Take 0.5 tablets (10 mg total) by mouth in the morning and at bedtime.   omega-3 acid ethyl esters 1 g capsule Commonly known as: LOVAZA Take 2 capsules (2 g total) by mouth 2 (two) times daily.   PHENObarbital 100 MG tablet Commonly known as: LUMINAL Take 1 tablet (100 mg total) by mouth at bedtime.   phenobarbital 16.2 MG tablet Commonly known as: LUMINAL Take 2 tablets (32.4 mg total) by mouth at bedtime.   rosuvastatin 10 MG tablet Commonly known as: CRESTOR Take 1 tablet (10 mg total) by  mouth daily.   sildenafil 20 MG tablet Commonly known as: REVATIO TAKE 1 TO 4 TABLETS BY MOUTH ONCE DAILY AS NEEDED         Objective:   BP (!) 154/91   Pulse 94   Temp 98.2 F (36.8 C)   Ht 6' (1.829 m)   Wt 260 lb (117.9 kg)   SpO2 97%   BMI 35.26 kg/m   Wt Readings from Last 3 Encounters:  12/07/21 260 lb (117.9 kg)  09/05/21 260 lb (117.9 kg)  05/18/21 268 lb (121.6 kg)    Physical Exam Vitals and nursing note reviewed.  Constitutional:      General: He is not in acute distress.    Appearance: He is well-developed. He is not diaphoretic.  Eyes:     General: No scleral icterus.    Conjunctiva/sclera: Conjunctivae normal.  Neck:     Thyroid: No thyromegaly.  Cardiovascular:     Rate and Rhythm: Normal rate and regular rhythm.     Heart sounds: Normal heart sounds. No murmur heard. Pulmonary:     Effort: Pulmonary effort is normal. No respiratory distress.     Breath sounds: Normal breath sounds. No wheezing.  Musculoskeletal:        General: Normal range of motion.     Cervical back: Neck supple.  Lymphadenopathy:     Cervical: No cervical adenopathy.  Skin:    General: Skin is warm and dry.     Findings: No rash.  Neurological:     Mental Status: He is alert and oriented to person, place, and time.     Coordination: Coordination normal.  Psychiatric:        Behavior: Behavior normal.       Assessment & Plan:   Problem List Items Addressed This Visit       Cardiovascular and Mediastinum   Hypertension   Relevant Medications   olmesartan (BENICAR) 20 MG tablet   sildenafil (REVATIO) 20 MG tablet     Other   Hyperlipidemia - Primary   Relevant Medications   olmesartan (BENICAR) 20 MG tablet   sildenafil (REVATIO) 20 MG tablet   Other Relevant Orders   Lipid panel   Seizures (HCC)   Relevant Medications   PHENObarbital (LUMINAL) 100 MG tablet   phenobarbital (LUMINAL) 16.2 MG tablet   Other Visit Diagnoses     Positive colorectal  cancer screening using Cologuard test       Relevant Orders   Ambulatory referral to Gastroenterology       Continue current medicine, seems to be doing well except fatigue. Follow up plan: Return in about 3 months (around 03/08/2022), or if symptoms worsen or fail to improve, for Hyperlipidemia and seizure.  Counseling provided for all of the vaccine components Orders Placed This Encounter  Procedures   Lipid panel   Ambulatory referral to Gastroenterology    Caryl Pina, MD [facility] 12/07/2021, 2:31 PM

## 2021-12-08 ENCOUNTER — Telehealth: Payer: Self-pay | Admitting: *Deleted

## 2021-12-08 LAB — LIPID PANEL
Chol/HDL Ratio: 5.1 ratio — ABNORMAL HIGH (ref 0.0–5.0)
Cholesterol, Total: 224 mg/dL — ABNORMAL HIGH (ref 100–199)
HDL: 44 mg/dL (ref >39)
LDL Chol Calc (NIH): 113 mg/dL — ABNORMAL HIGH (ref 0–99)
Triglycerides: 386 mg/dL — ABNORMAL HIGH (ref 0–149)
VLDL Cholesterol Cal: 67 mg/dL — ABNORMAL HIGH (ref 5–40)

## 2021-12-08 MED ORDER — BACLOFEN 10 MG PO TABS: 10.0000 mg | ORAL_TABLET | Freq: Three times a day (TID) | ORAL | 5 refills | Status: DC

## 2021-12-08 MED ORDER — OLMESARTAN MEDOXOMIL 20 MG PO TABS: 10.0000 mg | ORAL_TABLET | Freq: Two times a day (BID) | ORAL | 1 refills | Status: DC

## 2021-12-08 NOTE — Telephone Encounter (Signed)
Fax from [facility] RE: Rx for Baclofen & Olmesartan written on 12/07/21 They are no longer pt's mail order pharmacy. I looked up pt's new insurance card, they now use [facility] Above prescriptions sent to [facility] and I took [facility] out of pt's profile

## 2021-12-14 ENCOUNTER — Encounter: Payer: Self-pay | Admitting: Gastroenterology

## 2021-12-14 DIAGNOSIS — M62838 Other muscle spasm: Secondary | ICD-10-CM | POA: Diagnosis not present

## 2021-12-14 DIAGNOSIS — M6281 Muscle weakness (generalized): Secondary | ICD-10-CM | POA: Diagnosis not present

## 2022-01-06 ENCOUNTER — Ambulatory Visit (AMBULATORY_SURGERY_CENTER): Payer: Self-pay | Admitting: *Deleted

## 2022-01-06 VITALS — Ht 72.0 in | Wt 265.0 lb

## 2022-01-06 DIAGNOSIS — Z1211 Encounter for screening for malignant neoplasm of colon: Secondary | ICD-10-CM

## 2022-01-06 MED ORDER — NA SULFATE-K SULFATE-MG SULF 17.5-3.13-1.6 GM/177ML PO SOLN: 1.0000 | Freq: Once | ORAL | 0 refills | Status: AC

## 2022-01-06 NOTE — Progress Notes (Signed)

## 2022-01-11 DIAGNOSIS — M6281 Muscle weakness (generalized): Secondary | ICD-10-CM | POA: Diagnosis not present

## 2022-01-11 DIAGNOSIS — M62838 Other muscle spasm: Secondary | ICD-10-CM | POA: Diagnosis not present

## 2022-01-15 DIAGNOSIS — I110 Hypertensive heart disease with heart failure: Secondary | ICD-10-CM | POA: Diagnosis not present

## 2022-01-15 DIAGNOSIS — G8929 Other chronic pain: Secondary | ICD-10-CM | POA: Diagnosis not present

## 2022-01-15 DIAGNOSIS — S42191A Fracture of other part of scapula, right shoulder, initial encounter for closed fracture: Secondary | ICD-10-CM | POA: Diagnosis not present

## 2022-01-15 DIAGNOSIS — S42001A Fracture of unspecified part of right clavicle, initial encounter for closed fracture: Secondary | ICD-10-CM | POA: Diagnosis not present

## 2022-01-15 DIAGNOSIS — M25561 Pain in right knee: Secondary | ICD-10-CM | POA: Diagnosis not present

## 2022-01-15 DIAGNOSIS — S2241XA Multiple fractures of ribs, right side, initial encounter for closed fracture: Secondary | ICD-10-CM | POA: Diagnosis not present

## 2022-01-15 DIAGNOSIS — S82291A Other fracture of shaft of right tibia, initial encounter for closed fracture: Secondary | ICD-10-CM | POA: Diagnosis not present

## 2022-01-15 DIAGNOSIS — S42101A Fracture of unspecified part of scapula, right shoulder, initial encounter for closed fracture: Secondary | ICD-10-CM | POA: Diagnosis not present

## 2022-01-15 DIAGNOSIS — S2241XD Multiple fractures of ribs, right side, subsequent encounter for fracture with routine healing: Secondary | ICD-10-CM | POA: Diagnosis not present

## 2022-01-15 DIAGNOSIS — R519 Headache, unspecified: Secondary | ICD-10-CM | POA: Diagnosis not present

## 2022-01-15 DIAGNOSIS — R262 Difficulty in walking, not elsewhere classified: Secondary | ICD-10-CM | POA: Diagnosis not present

## 2022-01-15 DIAGNOSIS — S82141A Displaced bicondylar fracture of right tibia, initial encounter for closed fracture: Secondary | ICD-10-CM | POA: Diagnosis not present

## 2022-01-15 DIAGNOSIS — S199XXA Unspecified injury of neck, initial encounter: Secondary | ICD-10-CM | POA: Diagnosis not present

## 2022-01-15 DIAGNOSIS — Z981 Arthrodesis status: Secondary | ICD-10-CM | POA: Diagnosis not present

## 2022-01-15 DIAGNOSIS — R079 Chest pain, unspecified: Secondary | ICD-10-CM | POA: Diagnosis not present

## 2022-01-15 DIAGNOSIS — S42001D Fracture of unspecified part of right clavicle, subsequent encounter for fracture with routine healing: Secondary | ICD-10-CM | POA: Diagnosis not present

## 2022-01-15 DIAGNOSIS — S42011A Anterior displaced fracture of sternal end of right clavicle, initial encounter for closed fracture: Secondary | ICD-10-CM | POA: Diagnosis not present

## 2022-01-15 DIAGNOSIS — S42021A Displaced fracture of shaft of right clavicle, initial encounter for closed fracture: Secondary | ICD-10-CM | POA: Diagnosis not present

## 2022-01-15 DIAGNOSIS — Z20822 Contact with and (suspected) exposure to covid-19: Secondary | ICD-10-CM | POA: Diagnosis not present

## 2022-01-15 DIAGNOSIS — Z471 Aftercare following joint replacement surgery: Secondary | ICD-10-CM | POA: Diagnosis not present

## 2022-01-15 DIAGNOSIS — S42111A Displaced fracture of body of scapula, right shoulder, initial encounter for closed fracture: Secondary | ICD-10-CM | POA: Diagnosis not present

## 2022-01-15 DIAGNOSIS — S42111D Displaced fracture of body of scapula, right shoulder, subsequent encounter for fracture with routine healing: Secondary | ICD-10-CM | POA: Diagnosis not present

## 2022-01-15 DIAGNOSIS — S0990XA Unspecified injury of head, initial encounter: Secondary | ICD-10-CM | POA: Diagnosis not present

## 2022-01-15 DIAGNOSIS — I429 Cardiomyopathy, unspecified: Secondary | ICD-10-CM | POA: Diagnosis not present

## 2022-01-15 DIAGNOSIS — R0689 Other abnormalities of breathing: Secondary | ICD-10-CM | POA: Diagnosis not present

## 2022-01-15 DIAGNOSIS — S299XXA Unspecified injury of thorax, initial encounter: Secondary | ICD-10-CM | POA: Diagnosis not present

## 2022-01-15 DIAGNOSIS — S2242XA Multiple fractures of ribs, left side, initial encounter for closed fracture: Secondary | ICD-10-CM | POA: Diagnosis not present

## 2022-01-15 DIAGNOSIS — I10 Essential (primary) hypertension: Secondary | ICD-10-CM | POA: Diagnosis not present

## 2022-01-15 DIAGNOSIS — S82101D Unspecified fracture of upper end of right tibia, subsequent encounter for closed fracture with routine healing: Secondary | ICD-10-CM | POA: Diagnosis not present

## 2022-01-15 DIAGNOSIS — R569 Unspecified convulsions: Secondary | ICD-10-CM | POA: Diagnosis not present

## 2022-01-15 DIAGNOSIS — S80211A Abrasion, right knee, initial encounter: Secondary | ICD-10-CM | POA: Diagnosis not present

## 2022-01-15 DIAGNOSIS — G5600 Carpal tunnel syndrome, unspecified upper limb: Secondary | ICD-10-CM | POA: Diagnosis not present

## 2022-01-15 DIAGNOSIS — I509 Heart failure, unspecified: Secondary | ICD-10-CM | POA: Diagnosis not present

## 2022-01-15 DIAGNOSIS — S270XXA Traumatic pneumothorax, initial encounter: Secondary | ICD-10-CM | POA: Diagnosis not present

## 2022-01-15 DIAGNOSIS — M5136 Other intervertebral disc degeneration, lumbar region: Secondary | ICD-10-CM | POA: Diagnosis not present

## 2022-01-15 DIAGNOSIS — E785 Hyperlipidemia, unspecified: Secondary | ICD-10-CM | POA: Diagnosis not present

## 2022-01-15 DIAGNOSIS — Z1159 Encounter for screening for other viral diseases: Secondary | ICD-10-CM | POA: Diagnosis not present

## 2022-01-15 DIAGNOSIS — G4730 Sleep apnea, unspecified: Secondary | ICD-10-CM | POA: Diagnosis not present

## 2022-01-15 DIAGNOSIS — S225XXA Flail chest, initial encounter for closed fracture: Secondary | ICD-10-CM | POA: Diagnosis not present

## 2022-01-15 DIAGNOSIS — Z1152 Encounter for screening for COVID-19: Secondary | ICD-10-CM | POA: Diagnosis not present

## 2022-01-15 DIAGNOSIS — E279 Disorder of adrenal gland, unspecified: Secondary | ICD-10-CM | POA: Diagnosis not present

## 2022-01-15 DIAGNOSIS — I959 Hypotension, unspecified: Secondary | ICD-10-CM | POA: Diagnosis not present

## 2022-01-15 DIAGNOSIS — R269 Unspecified abnormalities of gait and mobility: Secondary | ICD-10-CM | POA: Diagnosis not present

## 2022-01-15 DIAGNOSIS — M5459 Other low back pain: Secondary | ICD-10-CM | POA: Diagnosis not present

## 2022-01-15 DIAGNOSIS — S3991XA Unspecified injury of abdomen, initial encounter: Secondary | ICD-10-CM | POA: Diagnosis not present

## 2022-01-15 DIAGNOSIS — R52 Pain, unspecified: Secondary | ICD-10-CM | POA: Diagnosis not present

## 2022-01-15 DIAGNOSIS — Z0389 Encounter for observation for other suspected diseases and conditions ruled out: Secondary | ICD-10-CM | POA: Diagnosis not present

## 2022-01-15 DIAGNOSIS — Z91199 Patient's noncompliance with other medical treatment and regimen due to unspecified reason: Secondary | ICD-10-CM | POA: Diagnosis not present

## 2022-01-15 DIAGNOSIS — M6281 Muscle weakness (generalized): Secondary | ICD-10-CM | POA: Diagnosis not present

## 2022-01-15 DIAGNOSIS — R197 Diarrhea, unspecified: Secondary | ICD-10-CM | POA: Diagnosis not present

## 2022-01-15 DIAGNOSIS — E278 Other specified disorders of adrenal gland: Secondary | ICD-10-CM | POA: Diagnosis not present

## 2022-01-15 DIAGNOSIS — S2243XA Multiple fractures of ribs, bilateral, initial encounter for closed fracture: Secondary | ICD-10-CM | POA: Diagnosis not present

## 2022-01-15 DIAGNOSIS — S272XXA Traumatic hemopneumothorax, initial encounter: Secondary | ICD-10-CM | POA: Diagnosis not present

## 2022-01-16 DIAGNOSIS — Z0389 Encounter for observation for other suspected diseases and conditions ruled out: Secondary | ICD-10-CM | POA: Diagnosis not present

## 2022-01-16 DIAGNOSIS — S2241XA Multiple fractures of ribs, right side, initial encounter for closed fracture: Secondary | ICD-10-CM | POA: Diagnosis not present

## 2022-01-16 DIAGNOSIS — S82141A Displaced bicondylar fracture of right tibia, initial encounter for closed fracture: Secondary | ICD-10-CM | POA: Diagnosis not present

## 2022-01-16 DIAGNOSIS — S42001A Fracture of unspecified part of right clavicle, initial encounter for closed fracture: Secondary | ICD-10-CM | POA: Diagnosis not present

## 2022-01-16 DIAGNOSIS — S42101A Fracture of unspecified part of scapula, right shoulder, initial encounter for closed fracture: Secondary | ICD-10-CM | POA: Diagnosis not present

## 2022-01-16 DIAGNOSIS — S42011A Anterior displaced fracture of sternal end of right clavicle, initial encounter for closed fracture: Secondary | ICD-10-CM | POA: Diagnosis not present

## 2022-01-17 DIAGNOSIS — S42191A Fracture of other part of scapula, right shoulder, initial encounter for closed fracture: Secondary | ICD-10-CM | POA: Diagnosis not present

## 2022-01-17 DIAGNOSIS — S270XXA Traumatic pneumothorax, initial encounter: Secondary | ICD-10-CM | POA: Diagnosis not present

## 2022-01-17 DIAGNOSIS — S2243XA Multiple fractures of ribs, bilateral, initial encounter for closed fracture: Secondary | ICD-10-CM | POA: Diagnosis not present

## 2022-01-17 DIAGNOSIS — S42001A Fracture of unspecified part of right clavicle, initial encounter for closed fracture: Secondary | ICD-10-CM | POA: Diagnosis not present

## 2022-01-17 DIAGNOSIS — S82141A Displaced bicondylar fracture of right tibia, initial encounter for closed fracture: Secondary | ICD-10-CM | POA: Diagnosis not present

## 2022-01-17 DIAGNOSIS — S2241XA Multiple fractures of ribs, right side, initial encounter for closed fracture: Secondary | ICD-10-CM | POA: Diagnosis not present

## 2022-01-18 ENCOUNTER — Encounter: Payer: Self-pay | Admitting: Gastroenterology

## 2022-01-18 DIAGNOSIS — S2241XA Multiple fractures of ribs, right side, initial encounter for closed fracture: Secondary | ICD-10-CM | POA: Diagnosis not present

## 2022-01-19 DIAGNOSIS — S2241XA Multiple fractures of ribs, right side, initial encounter for closed fracture: Secondary | ICD-10-CM | POA: Diagnosis not present

## 2022-01-20 DIAGNOSIS — S2241XA Multiple fractures of ribs, right side, initial encounter for closed fracture: Secondary | ICD-10-CM | POA: Diagnosis not present

## 2022-01-21 DIAGNOSIS — S2241XA Multiple fractures of ribs, right side, initial encounter for closed fracture: Secondary | ICD-10-CM | POA: Diagnosis not present

## 2022-01-22 DIAGNOSIS — S2241XA Multiple fractures of ribs, right side, initial encounter for closed fracture: Secondary | ICD-10-CM | POA: Diagnosis not present

## 2022-01-23 DIAGNOSIS — S2241XA Multiple fractures of ribs, right side, initial encounter for closed fracture: Secondary | ICD-10-CM | POA: Diagnosis not present

## 2022-01-23 DIAGNOSIS — S42101A Fracture of unspecified part of scapula, right shoulder, initial encounter for closed fracture: Secondary | ICD-10-CM | POA: Diagnosis not present

## 2022-01-23 DIAGNOSIS — S42001A Fracture of unspecified part of right clavicle, initial encounter for closed fracture: Secondary | ICD-10-CM | POA: Diagnosis not present

## 2022-01-23 DIAGNOSIS — S82141A Displaced bicondylar fracture of right tibia, initial encounter for closed fracture: Secondary | ICD-10-CM | POA: Diagnosis not present

## 2022-01-24 DIAGNOSIS — S2241XA Multiple fractures of ribs, right side, initial encounter for closed fracture: Secondary | ICD-10-CM | POA: Diagnosis not present

## 2022-01-25 DIAGNOSIS — S2241XA Multiple fractures of ribs, right side, initial encounter for closed fracture: Secondary | ICD-10-CM | POA: Diagnosis not present

## 2022-01-26 DIAGNOSIS — S2241XA Multiple fractures of ribs, right side, initial encounter for closed fracture: Secondary | ICD-10-CM | POA: Diagnosis not present

## 2022-01-27 ENCOUNTER — Encounter: Payer: Medicare Other | Admitting: Gastroenterology

## 2022-01-27 DIAGNOSIS — E279 Disorder of adrenal gland, unspecified: Secondary | ICD-10-CM | POA: Diagnosis not present

## 2022-01-27 DIAGNOSIS — R262 Difficulty in walking, not elsewhere classified: Secondary | ICD-10-CM | POA: Diagnosis not present

## 2022-01-27 DIAGNOSIS — R197 Diarrhea, unspecified: Secondary | ICD-10-CM | POA: Diagnosis not present

## 2022-01-27 DIAGNOSIS — S2241XA Multiple fractures of ribs, right side, initial encounter for closed fracture: Secondary | ICD-10-CM | POA: Diagnosis not present

## 2022-01-27 DIAGNOSIS — R269 Unspecified abnormalities of gait and mobility: Secondary | ICD-10-CM | POA: Diagnosis not present

## 2022-01-27 DIAGNOSIS — S82141A Displaced bicondylar fracture of right tibia, initial encounter for closed fracture: Secondary | ICD-10-CM | POA: Diagnosis not present

## 2022-01-27 DIAGNOSIS — E785 Hyperlipidemia, unspecified: Secondary | ICD-10-CM | POA: Diagnosis not present

## 2022-01-27 DIAGNOSIS — I10 Essential (primary) hypertension: Secondary | ICD-10-CM | POA: Diagnosis not present

## 2022-01-27 DIAGNOSIS — E860 Dehydration: Secondary | ICD-10-CM | POA: Diagnosis not present

## 2022-01-27 DIAGNOSIS — S42111D Displaced fracture of body of scapula, right shoulder, subsequent encounter for fracture with routine healing: Secondary | ICD-10-CM | POA: Diagnosis not present

## 2022-01-27 DIAGNOSIS — S42001D Fracture of unspecified part of right clavicle, subsequent encounter for fracture with routine healing: Secondary | ICD-10-CM | POA: Diagnosis not present

## 2022-01-27 DIAGNOSIS — S42101A Fracture of unspecified part of scapula, right shoulder, initial encounter for closed fracture: Secondary | ICD-10-CM | POA: Diagnosis not present

## 2022-01-27 DIAGNOSIS — M5459 Other low back pain: Secondary | ICD-10-CM | POA: Diagnosis not present

## 2022-01-27 DIAGNOSIS — R109 Unspecified abdominal pain: Secondary | ICD-10-CM | POA: Diagnosis not present

## 2022-01-27 DIAGNOSIS — S82101D Unspecified fracture of upper end of right tibia, subsequent encounter for closed fracture with routine healing: Secondary | ICD-10-CM | POA: Diagnosis not present

## 2022-01-27 DIAGNOSIS — S2241XD Multiple fractures of ribs, right side, subsequent encounter for fracture with routine healing: Secondary | ICD-10-CM | POA: Diagnosis not present

## 2022-01-27 DIAGNOSIS — R569 Unspecified convulsions: Secondary | ICD-10-CM | POA: Diagnosis not present

## 2022-01-27 DIAGNOSIS — G5600 Carpal tunnel syndrome, unspecified upper limb: Secondary | ICD-10-CM | POA: Diagnosis not present

## 2022-01-27 DIAGNOSIS — Z471 Aftercare following joint replacement surgery: Secondary | ICD-10-CM | POA: Diagnosis not present

## 2022-01-27 DIAGNOSIS — Z1159 Encounter for screening for other viral diseases: Secondary | ICD-10-CM | POA: Diagnosis not present

## 2022-01-27 DIAGNOSIS — M6281 Muscle weakness (generalized): Secondary | ICD-10-CM | POA: Diagnosis not present

## 2022-01-27 DIAGNOSIS — S82141D Displaced bicondylar fracture of right tibia, subsequent encounter for closed fracture with routine healing: Secondary | ICD-10-CM | POA: Diagnosis not present

## 2022-01-27 DIAGNOSIS — R52 Pain, unspecified: Secondary | ICD-10-CM | POA: Diagnosis not present

## 2022-01-27 DIAGNOSIS — S42001A Fracture of unspecified part of right clavicle, initial encounter for closed fracture: Secondary | ICD-10-CM | POA: Diagnosis not present

## 2022-01-28 ENCOUNTER — Other Ambulatory Visit: Payer: Self-pay | Admitting: Family Medicine

## 2022-01-28 DIAGNOSIS — R569 Unspecified convulsions: Secondary | ICD-10-CM

## 2022-01-30 DIAGNOSIS — S42001D Fracture of unspecified part of right clavicle, subsequent encounter for fracture with routine healing: Secondary | ICD-10-CM | POA: Diagnosis not present

## 2022-01-30 DIAGNOSIS — I10 Essential (primary) hypertension: Secondary | ICD-10-CM | POA: Diagnosis not present

## 2022-01-30 DIAGNOSIS — S2241XD Multiple fractures of ribs, right side, subsequent encounter for fracture with routine healing: Secondary | ICD-10-CM | POA: Diagnosis not present

## 2022-01-30 DIAGNOSIS — E785 Hyperlipidemia, unspecified: Secondary | ICD-10-CM | POA: Diagnosis not present

## 2022-01-30 DIAGNOSIS — S42111D Displaced fracture of body of scapula, right shoulder, subsequent encounter for fracture with routine healing: Secondary | ICD-10-CM | POA: Diagnosis not present

## 2022-01-30 DIAGNOSIS — S82101D Unspecified fracture of upper end of right tibia, subsequent encounter for closed fracture with routine healing: Secondary | ICD-10-CM | POA: Diagnosis not present

## 2022-01-30 NOTE — Telephone Encounter (Signed)
Patient was given a 95-monthsupply on September 20, he should have enough to get through till December 20 which would be the day before his appointment.  He should not need this refilled, I do not know why this request is coming through

## 2022-01-31 ENCOUNTER — Other Ambulatory Visit: Payer: Self-pay | Admitting: Family Medicine

## 2022-01-31 DIAGNOSIS — R197 Diarrhea, unspecified: Secondary | ICD-10-CM | POA: Diagnosis not present

## 2022-02-01 DIAGNOSIS — E860 Dehydration: Secondary | ICD-10-CM | POA: Diagnosis not present

## 2022-02-02 DIAGNOSIS — S82141D Displaced bicondylar fracture of right tibia, subsequent encounter for closed fracture with routine healing: Secondary | ICD-10-CM | POA: Diagnosis not present

## 2022-02-02 DIAGNOSIS — S2241XD Multiple fractures of ribs, right side, subsequent encounter for fracture with routine healing: Secondary | ICD-10-CM | POA: Diagnosis not present

## 2022-02-02 DIAGNOSIS — S42001D Fracture of unspecified part of right clavicle, subsequent encounter for fracture with routine healing: Secondary | ICD-10-CM | POA: Diagnosis not present

## 2022-02-03 DIAGNOSIS — S42111D Displaced fracture of body of scapula, right shoulder, subsequent encounter for fracture with routine healing: Secondary | ICD-10-CM | POA: Diagnosis not present

## 2022-02-03 DIAGNOSIS — S2241XD Multiple fractures of ribs, right side, subsequent encounter for fracture with routine healing: Secondary | ICD-10-CM | POA: Diagnosis not present

## 2022-02-03 DIAGNOSIS — S82101D Unspecified fracture of upper end of right tibia, subsequent encounter for closed fracture with routine healing: Secondary | ICD-10-CM | POA: Diagnosis not present

## 2022-02-03 DIAGNOSIS — S42001D Fracture of unspecified part of right clavicle, subsequent encounter for fracture with routine healing: Secondary | ICD-10-CM | POA: Diagnosis not present

## 2022-02-06 DIAGNOSIS — I10 Essential (primary) hypertension: Secondary | ICD-10-CM | POA: Diagnosis not present

## 2022-02-06 DIAGNOSIS — S42111D Displaced fracture of body of scapula, right shoulder, subsequent encounter for fracture with routine healing: Secondary | ICD-10-CM | POA: Diagnosis not present

## 2022-02-06 DIAGNOSIS — S2241XD Multiple fractures of ribs, right side, subsequent encounter for fracture with routine healing: Secondary | ICD-10-CM | POA: Diagnosis not present

## 2022-02-06 DIAGNOSIS — S82101D Unspecified fracture of upper end of right tibia, subsequent encounter for closed fracture with routine healing: Secondary | ICD-10-CM | POA: Diagnosis not present

## 2022-02-07 DIAGNOSIS — S42111D Displaced fracture of body of scapula, right shoulder, subsequent encounter for fracture with routine healing: Secondary | ICD-10-CM | POA: Diagnosis not present

## 2022-02-07 DIAGNOSIS — S2241XD Multiple fractures of ribs, right side, subsequent encounter for fracture with routine healing: Secondary | ICD-10-CM | POA: Diagnosis not present

## 2022-02-07 DIAGNOSIS — S82101D Unspecified fracture of upper end of right tibia, subsequent encounter for closed fracture with routine healing: Secondary | ICD-10-CM | POA: Diagnosis not present

## 2022-02-07 DIAGNOSIS — S42001D Fracture of unspecified part of right clavicle, subsequent encounter for fracture with routine healing: Secondary | ICD-10-CM | POA: Diagnosis not present

## 2022-02-08 ENCOUNTER — Other Ambulatory Visit: Payer: Self-pay | Admitting: Family Medicine

## 2022-02-08 DIAGNOSIS — R569 Unspecified convulsions: Secondary | ICD-10-CM

## 2022-02-08 NOTE — Telephone Encounter (Signed)
Patient had phenobarbital 90-day supply given on September 20 so he should be good until December 20 which should get him to his appointment

## 2022-02-10 DIAGNOSIS — S82101D Unspecified fracture of upper end of right tibia, subsequent encounter for closed fracture with routine healing: Secondary | ICD-10-CM | POA: Diagnosis not present

## 2022-02-10 DIAGNOSIS — S42111D Displaced fracture of body of scapula, right shoulder, subsequent encounter for fracture with routine healing: Secondary | ICD-10-CM | POA: Diagnosis not present

## 2022-02-10 DIAGNOSIS — S2241XD Multiple fractures of ribs, right side, subsequent encounter for fracture with routine healing: Secondary | ICD-10-CM | POA: Diagnosis not present

## 2022-02-10 DIAGNOSIS — S42001D Fracture of unspecified part of right clavicle, subsequent encounter for fracture with routine healing: Secondary | ICD-10-CM | POA: Diagnosis not present

## 2022-02-10 DIAGNOSIS — I10 Essential (primary) hypertension: Secondary | ICD-10-CM | POA: Diagnosis not present

## 2022-02-13 ENCOUNTER — Other Ambulatory Visit: Payer: Self-pay | Admitting: Family Medicine

## 2022-02-13 DIAGNOSIS — R569 Unspecified convulsions: Secondary | ICD-10-CM

## 2022-02-15 ENCOUNTER — Other Ambulatory Visit: Payer: Self-pay | Admitting: Family Medicine

## 2022-02-15 DIAGNOSIS — S2241XD Multiple fractures of ribs, right side, subsequent encounter for fracture with routine healing: Secondary | ICD-10-CM | POA: Diagnosis not present

## 2022-02-15 DIAGNOSIS — I10 Essential (primary) hypertension: Secondary | ICD-10-CM | POA: Diagnosis not present

## 2022-02-15 DIAGNOSIS — S82101D Unspecified fracture of upper end of right tibia, subsequent encounter for closed fracture with routine healing: Secondary | ICD-10-CM | POA: Diagnosis not present

## 2022-02-15 DIAGNOSIS — E785 Hyperlipidemia, unspecified: Secondary | ICD-10-CM | POA: Diagnosis not present

## 2022-02-15 DIAGNOSIS — S42001D Fracture of unspecified part of right clavicle, subsequent encounter for fracture with routine healing: Secondary | ICD-10-CM | POA: Diagnosis not present

## 2022-02-15 DIAGNOSIS — R569 Unspecified convulsions: Secondary | ICD-10-CM

## 2022-02-15 DIAGNOSIS — S42111D Displaced fracture of body of scapula, right shoulder, subsequent encounter for fracture with routine healing: Secondary | ICD-10-CM | POA: Diagnosis not present

## 2022-02-16 ENCOUNTER — Telehealth: Payer: Self-pay | Admitting: Family Medicine

## 2022-02-16 DIAGNOSIS — E785 Hyperlipidemia, unspecified: Secondary | ICD-10-CM | POA: Diagnosis not present

## 2022-02-16 DIAGNOSIS — S42001D Fracture of unspecified part of right clavicle, subsequent encounter for fracture with routine healing: Secondary | ICD-10-CM | POA: Diagnosis not present

## 2022-02-16 DIAGNOSIS — I10 Essential (primary) hypertension: Secondary | ICD-10-CM | POA: Diagnosis not present

## 2022-02-16 DIAGNOSIS — S42111D Displaced fracture of body of scapula, right shoulder, subsequent encounter for fracture with routine healing: Secondary | ICD-10-CM | POA: Diagnosis not present

## 2022-02-16 DIAGNOSIS — S2241XD Multiple fractures of ribs, right side, subsequent encounter for fracture with routine healing: Secondary | ICD-10-CM | POA: Diagnosis not present

## 2022-02-16 DIAGNOSIS — R569 Unspecified convulsions: Secondary | ICD-10-CM

## 2022-02-16 NOTE — Telephone Encounter (Signed)
Please call the pharmacy and ask them where the status is because I sent a 90-day prescription at the appointment of September which should last 3 months which should last him until December 18th, he should not be due for needing more unless they did not feel this 1.  Please call the pharmacy and let me know what they say

## 2022-02-16 NOTE — Telephone Encounter (Signed)
Verified with mail order pharmacy [facility] that they do not have an active Rx for pt since 2020. [facility] The last refill pt received was in Aug. A 90 day supply of both strengths of phenobarbital.  Please review for refill. He did not get a Rx in September for 90 day supply.

## 2022-02-16 NOTE — Telephone Encounter (Signed)
  Prescription Request  02/16/2022  Is this a "Controlled Substance" medicine? yes  Have you seen your PCP in the last 2 weeks? no  If YES, route message to pool  -  If NO, patient needs to be scheduled for appointment.  What is the name of the medication or equipment? Phenobarbital 100 mg, Phenobarbial 16.2 mg - Patient states it was denied and only has less than week left. Please call.  Have you contacted your pharmacy to request a refill? yes   [facility] [place]   Patient notified that their request is being sent to the clinical staff for review and that they should receive a response within 2 business days.

## 2022-02-16 NOTE — Telephone Encounter (Signed)
Last office visit and refill 12/07/21

## 2022-02-17 MED ORDER — PHENOBARBITAL 16.2 MG PO TABS: 32.4000 mg | ORAL_TABLET | Freq: Every day | ORAL | 0 refills | Status: DC

## 2022-02-17 MED ORDER — PHENOBARBITAL 100 MG PO TABS: 100.0000 mg | ORAL_TABLET | Freq: Every day | ORAL | 0 refills | Status: DC

## 2022-02-17 NOTE — Telephone Encounter (Signed)
I sent the medication to his pharmacy for him.  I do not know why they did not have my prescription from September that I sent.  That is weird but I did send 1 for now.  For some reason he must be off cycle as well because if he did not have September is refilled and he should have run out 2 months ago unless he had some extra.  Since I sent this today have him go ahead and push his appointment back to about 2-1/2 months from now

## 2022-02-17 NOTE — Telephone Encounter (Signed)
Pt scheduled for 05/08/21 at 12:55 for follow up and is aware rx sent in.

## 2022-02-21 ENCOUNTER — Other Ambulatory Visit: Payer: Self-pay | Admitting: Family Medicine

## 2022-02-21 DIAGNOSIS — R569 Unspecified convulsions: Secondary | ICD-10-CM

## 2022-02-22 ENCOUNTER — Encounter: Payer: Self-pay | Admitting: Family Medicine

## 2022-02-22 ENCOUNTER — Other Ambulatory Visit: Payer: Self-pay | Admitting: Family Medicine

## 2022-02-22 DIAGNOSIS — R569 Unspecified convulsions: Secondary | ICD-10-CM

## 2022-02-22 MED ORDER — PHENOBARBITAL 100 MG PO TABS: 100.0000 mg | ORAL_TABLET | Freq: Every day | ORAL | 0 refills | Status: DC

## 2022-02-22 MED ORDER — PHENOBARBITAL 16.2 MG PO TABS: 32.4000 mg | ORAL_TABLET | Freq: Every day | ORAL | 0 refills | Status: DC

## 2022-02-22 NOTE — Telephone Encounter (Signed)
TC to [facility] pharmacy to make sure pt does not receive mail order script of Phenobarbital as well. They do not have him as an active pt since 2020

## 2022-02-22 NOTE — Telephone Encounter (Signed)
I sent him to the local pharmacy but we are going to have to call the mail-order pharmacy and make sure that the prescriptions get canceled out so that he does not get double.

## 2022-02-22 NOTE — Addendum Note (Signed)
Addended by: Caryl Pina on: 02/22/2022 03:27 PM   Modules accepted: Orders

## 2022-02-22 NOTE — Telephone Encounter (Signed)
  Prescription Request  02/22/2022  Is this a "Controlled Substance" medicine?   Have you seen your PCP in the last 2 weeks? No, sent 11/30  If YES, route message to pool  -  If NO, patient needs to be scheduled for appointment.  What is the name of the medication or equipment? PHENObarbital (LUMINAL) 100 MG tablet phenobarbital (LUMINAL) 16.2 MG tablet  Have you contacted your pharmacy to request a refill? yes   [facility] [place]   Patient notified that their request is being sent to the clinical staff for review and that they should receive a response within 2 business days.

## 2022-02-27 DIAGNOSIS — S42021D Displaced fracture of shaft of right clavicle, subsequent encounter for fracture with routine healing: Secondary | ICD-10-CM | POA: Diagnosis not present

## 2022-02-27 DIAGNOSIS — S82141D Displaced bicondylar fracture of right tibia, subsequent encounter for closed fracture with routine healing: Secondary | ICD-10-CM | POA: Diagnosis not present

## 2022-02-27 DIAGNOSIS — S42011D Anterior displaced fracture of sternal end of right clavicle, subsequent encounter for fracture with routine healing: Secondary | ICD-10-CM | POA: Diagnosis not present

## 2022-03-01 ENCOUNTER — Telehealth: Payer: Self-pay

## 2022-03-01 ENCOUNTER — Ambulatory Visit (INDEPENDENT_AMBULATORY_CARE_PROVIDER_SITE_OTHER): Payer: Medicare Other

## 2022-03-01 DIAGNOSIS — Z0000 Encounter for general adult medical examination without abnormal findings: Secondary | ICD-10-CM

## 2022-03-01 MED ORDER — TOLTERODINE TARTRATE 1 MG PO TABS: 1.0000 mg | ORAL_TABLET | Freq: Two times a day (BID) | ORAL | 3 refills | Status: DC

## 2022-03-01 NOTE — Progress Notes (Signed)
Subjective:   Christopher Salas is a 56 y.o. male who presents for Medicare Annual/Subsequent preventive examination.  I connected with  Christopher Salas on 03/01/22 by a audio enabled telemedicine application and verified that I am speaking with the correct person using two identifiers.  Patient Location: Home  Provider Location: Office/Clinic  I discussed the limitations of evaluation and management by telemedicine. The patient expressed understanding and agreed to proceed.   Review of Systems    Defer to PCP  Cardiac Risk Factors include: advanced age (>4mn, >>39women);dyslipidemia;hypertension;male gender;sedentary lifestyle     Objective:    There were no vitals filed for this visit. There is no height or weight on file to calculate BMI.     03/01/2022   11:54 AM 05/18/2021    7:50 AM 05/09/2021    9:17 AM 02/15/2021    2:43 PM 12/10/2017    9:43 AM  Advanced Directives  Does Patient Have a Medical Advance Directive? Yes Yes Yes No Yes  Type of AParamedicof [place]Living will [place]St. [place]Living will [place]Living will  [place]Living will  Does patient want to make changes to medical advance directive? No - Patient declined No - Patient declined     Copy of [place] Chart? No - copy requested No - copy requested     Would patient like information on creating a medical advance directive?    No - Patient declined     Current Medications (verified) Outpatient Encounter Medications as of 03/01/2022  Medication Sig   acetaminophen (TYLENOL) 325 MG tablet Take 650 mg by mouth every 6 (six) hours as needed for moderate pain.   baclofen (LIORESAL) 10 MG tablet TAKE 1 TABLET BY MOUTH THREE TIMES A DAY   docusate sodium (COLACE) 100 MG capsule Take 1 capsule (100 mg total) by mouth 2 (two) times daily.   Fe Bisgly-Succ-C-Thre-B12-FA (IRON-150 PO) Take 1 tablet by mouth daily.    fenofibrate (TRICOR) 48 MG tablet TAKE 2 TABLETS (96 MG TOTAL) BY MOUTH DAILY.   gabapentin (NEURONTIN) 400 MG capsule Take 1 capsule (400 mg total) by mouth 3 (three) times daily.   HYDROcodone-acetaminophen (NORCO) 5-325 MG tablet Take 1-2 tablets by mouth every 6 (six) hours as needed for moderate pain.   meloxicam (MOBIC) 15 MG tablet Take 15 mg by mouth daily.   niacin (NIASPAN) 500 MG CR tablet Take 1 tablet (500 mg total) by mouth daily with breakfast.   olmesartan (BENICAR) 20 MG tablet Take 0.5 tablets (10 mg total) by mouth in the morning and at bedtime.   omega-3 acid ethyl esters (LOVAZA) 1 g capsule Take 2 capsules (2 g total) by mouth 2 (two) times daily.   PHENObarbital (LUMINAL) 100 MG tablet Take 1 tablet (100 mg total) by mouth at bedtime.   phenobarbital (LUMINAL) 16.2 MG tablet Take 2 tablets (32.4 mg total) by mouth at bedtime.   rosuvastatin (CRESTOR) 10 MG tablet Take 1 tablet (10 mg total) by mouth daily.   tolterodine (DETROL) 2 MG tablet Take 2 mg by mouth daily.   bicalutamide (CASODEX) 50 MG tablet Take 50-100 mg by mouth daily. (Patient not taking: Reported on 03/01/2022)   cyclobenzaprine (FLEXERIL) 10 MG tablet Take 10 mg by mouth every 8 (eight) hours as needed for muscle spasms. (Patient not taking: Reported on 03/01/2022)   sildenafil (REVATIO) 20 MG tablet TAKE 1 TO 4 TABLETS BY MOUTH  ONCE DAILY AS NEEDED (Patient not taking: Reported on 01/06/2022)   No facility-administered encounter medications on file as of 03/01/2022.    Allergies (verified) Patient has no known allergies.   History: Past Medical History:  Diagnosis Date   Allergy    seasonal   Arthritis    hands   Cancer ([place])    prostate   Hyperlipidemia    Hypertension    Insomnia    Neuromuscular disorder ([place])    Left leg numb and weak   Seizures ([place])    last one 40 years ago   Past Surgical History:  Procedure Laterality Date   BACK SURGERY  2020   L4-L6   HAND SURGERY Right     LAMINECTOMY AND MICRODISCECTOMY LUMBAR SPINE  2021   LYMPH NODE DISSECTION Bilateral 05/18/2021   Procedure: LYMPH NODE DISSECTION;  Surgeon: Alexis Frock, MD;  Location: [facility];  Service: Urology;  Laterality: Bilateral;   ROBOT ASSISTED LAPAROSCOPIC RADICAL PROSTATECTOMY N/A 05/18/2021   Procedure: XI ROBOTIC ASSISTED LAPAROSCOPIC RADICAL PROSTATECTOMY WITH INDOCYANINE GREEN DYE INJECTION;  Surgeon: Alexis Frock, MD;  Location: [facility];  Service: Urology;  Laterality: N/A;  3 HRS   Family History  Problem Relation Age of Onset   Emphysema Mother    Prostate cancer Father    Diabetes Father    CAD Father 17       CABG X 5 (surgery twice)   Colon polyps Brother    Heart disease Maternal Grandfather    Heart disease Paternal Grandfather    Colon cancer Neg Hx    Crohn's disease Neg Hx    Esophageal cancer Neg Hx    Rectal cancer Neg Hx    Stomach cancer Neg Hx    Ulcerative colitis Neg Hx    Social History   Socioeconomic History   Marital status: Divorced    Spouse name: Not on file   Number of children: 1   Years of education: Not on file   Highest education level: Not on file  Occupational History   Occupation: disability  Tobacco Use   Smoking status: Never    Passive exposure: Never   Smokeless tobacco: Never  Vaping Use   Vaping Use: Never used  Substance and Sexual Activity   Alcohol use: Never   Drug use: Never   Sexual activity: Yes  Other Topics Concern   Not on file  Social History Narrative   Not on file   Social Determinants of Health   Financial Resource Strain: Low Risk  (03/01/2022)   Overall Financial Resource Strain (CARDIA)    Difficulty of Paying Living Expenses: Not hard at all  Food Insecurity: No Food Insecurity (03/01/2022)   Hunger Vital Sign    Worried About Running Out of Food in the Last Year: Never true    Ran Out of Food in the Last Year: Never true  Transportation Needs: No Transportation Needs (03/01/2022)   PRAPARE -  Hydrologist (Medical): No    Lack of Transportation (Non-Medical): No  Physical Activity: Inactive (03/01/2022)   Exercise Vital Sign    Days of Exercise per Week: 0 days    Minutes of Exercise per Session: 0 min  Stress: No Stress Concern Present (03/01/2022)   [place]    Feeling of Stress : Not at all  Social Connections: Moderately Isolated (03/01/2022)   Social Connection and Isolation Panel [NHANES]  Frequency of Communication with Friends and Family: More than three times a week    Frequency of Social Gatherings with Friends and Family: Three times a week    Attends Religious Services: 1 to 4 times per year    Active Member of Clubs or Organizations: No    Attends Archivist Meetings: Never    Marital Status: Divorced    Tobacco Counseling Counseling given: Not Answered   Clinical Intake:  Pre-visit preparation completed: Yes  Pain : No/denies pain     Nutritional Risks: Unintentional weight loss Diabetes: No  How often do you need to have someone help you when you read instructions, pamphlets, or other written materials from your doctor or pharmacy?: 1 - Never What is the last grade level you completed in school?: 12th grade  Diabetic?  No  Interpreter Needed?: No  Information entered by :: Felicity Coyer LPN   Activities of Daily Living    03/01/2022   11:56 AM 02/28/2022    1:01 PM  In your present state of health, do you have any difficulty performing the following activities:  Hearing? 0 0  Vision? 0 0  Difficulty concentrating or making decisions? 0 0  Walking or climbing stairs? 0 0  Dressing or bathing? 0 0  Doing errands, shopping? 0 0  Preparing Food and eating ? N N  Using the Toilet? N N  In the past six months, have you accidently leaked urine? N Y  Do you have problems with loss of bowel control? N N  Managing your Medications? N N   Managing your Finances? N N  Housekeeping or managing your Housekeeping? N N    Patient Care Team: Dettinger, Fransisca Kaufmann, MD as PCP - General (Family Medicine) Minus Breeding, MD as PCP - Cardiology (Cardiology)  Indicate any recent Medical Services you may have received from other than [facility] providers in the past year (date may be approximate).     Assessment:   This is a routine wellness examination for [place].  Hearing/Vision screen No results found.  Dietary issues and exercise activities discussed: Current Exercise Habits: The patient does not participate in regular exercise at present   Goals Addressed             This Visit's Progress    Patient Stated       03/01/2022 AWV Goal: Exercise for General Health  Patient will verbalize understanding of the benefits of increased physical activity: Exercising regularly is important. It will improve your overall fitness, flexibility, and endurance. Regular exercise also will improve your overall health. It can help you control your weight, reduce stress, and improve your bone density. Over the next year, patient will increase physical activity as tolerated with a goal of at least 150 minutes of moderate physical activity per week.  You can tell that you are exercising at a moderate intensity if your heart starts beating faster and you start breathing faster but can still hold a conversation. Moderate-intensity exercise ideas include: Walking 1 mile (1.6 km) in about 15 minutes Biking Hiking Golfing Dancing Water aerobics Patient will verbalize understanding of everyday activities that increase physical activity by providing examples like the following: Yard work, such as: Sales promotion account executive Gardening Washing windows or floors Patient will be able to explain general safety guidelines for exercising:  Before you start a new exercise program,  talk with your health care provider. Do  not exercise so much that you hurt yourself, feel dizzy, or get very short of breath. Wear comfortable clothes and wear shoes with good support. Drink plenty of water while you exercise to prevent dehydration or heat stroke. Work out until your breathing and your heartbeat get faster.        Depression Screen    03/01/2022   11:53 AM 12/07/2021    2:11 PM 09/05/2021    3:09 PM 02/15/2021    2:42 PM 11/25/2020    2:54 PM 05/26/2020    1:08 PM 11/26/2019    1:17 PM  PHQ 2/9 Scores  PHQ - 2 Score 0 2 0 2 4 0 0  PHQ- 9 Score  '8  13 25      '$ Fall Risk    03/01/2022   11:57 AM 02/28/2022    1:01 PM 12/07/2021    2:11 PM 09/05/2021    3:08 PM 02/15/2021    2:55 PM  Fall Risk   Falls in the past year? 0 '1 1 1 1  '$ Number falls in past yr:  1 0 1 0  Injury with Fall?  0 1 0 0  Risk for fall due to :   Impaired balance/gait Impaired balance/gait Impaired balance/gait;Orthopedic patient;Medication side effect;Other (Comment)  Risk for fall due to: Comment     neuropathy  Follow up Falls evaluation completed  Falls evaluation completed Falls evaluation completed Falls prevention discussed    FALL RISK PREVENTION PERTAINING TO THE HOME:  Any stairs in or around the home? Yes  If so, are there any without handrails? No  Home free of loose throw rugs in walkways, pet beds, electrical cords, etc? No  Adequate lighting in your home to reduce risk of falls? Yes   ASSISTIVE DEVICES UTILIZED TO PREVENT FALLS:  Life alert? No  Use of a cane, walker or w/c? Yes  Grab bars in the bathroom? Yes  Shower chair or bench in shower? No  Elevated toilet seat or a handicapped toilet? No   TIMED UP AND GO:  Was the test performed? No .   Cognitive Function:        03/01/2022   11:56 AM 02/15/2021    2:44 PM  6CIT Screen  What Year? 0 points 0 points  What month? 0 points 0 points  What time? 0 points 0 points  Count back from 20 0 points 0 points   Months in reverse 0 points 4 points  Repeat phrase 0 points 2 points  Total Score 0 points 6 points    Immunizations Immunization History  Administered Date(s) Administered   Janssen (J&J) SARS-COV-2 Vaccination 08/26/2019, 03/28/2020   PPD Test 03/15/2016   Tdap 03/15/2016    TDAP status: Up to date  Flu Vaccine status: Declined, Education has been provided regarding the importance of this vaccine but patient still declined. Advised may receive this vaccine at local pharmacy or Health Dept. Aware to provide a copy of the vaccination record if obtained from local pharmacy or Health Dept. Verbalized acceptance and understanding.  Pneumococcal vaccine status: Due, Education has been provided regarding the importance of this vaccine. Advised may receive this vaccine at local pharmacy or Health Dept. Aware to provide a copy of the vaccination record if obtained from local pharmacy or Health Dept. Verbalized acceptance and understanding.  Covid-19 vaccine status: Completed vaccines  Qualifies for Shingles Vaccine? Yes   Zostavax completed No   Shingrix Completed?: No.    Education has been provided  regarding the importance of this vaccine. Patient has been advised to call insurance company to determine out of pocket expense if they have not yet received this vaccine. Advised may also receive vaccine at local pharmacy or Health Dept. Verbalized acceptance and understanding.  Screening Tests Health Maintenance  Topic Date Due   COVID-19 Vaccine (3 - 2023-24 season) 11/18/2021   Medicare Annual Wellness (AWV)  02/15/2022   INFLUENZA VACCINE  06/18/2022 (Originally 10/18/2021)   Zoster Vaccines- Shingrix (1 of 2) 12/04/2022 (Originally 08/30/1982)   Fecal DNA (Cologuard)  01/18/2024   DTaP/Tdap/Td (2 - Td or Tdap) 03/15/2026   Hepatitis C Screening  Completed   HIV Screening  Completed   HPV VACCINES  Aged Out    Health Maintenance  Health Maintenance Due  Topic Date Due   COVID-19  Vaccine (3 - 2023-24 season) 11/18/2021   Medicare Annual Wellness (AWV)  02/15/2022    Colorectal cancer screening: Type of screening: Colonoscopy. Completed 01/17/2021. Repeat every three years  Lung Cancer Screening: (Low Dose CT Chest recommended if Age 35-80 years, 30 pack-year currently smoking OR have quit w/in 15years.) does not qualify.   Lung Cancer Screening Referral: N/A  Additional Screening:  Hepatitis C Screening: does qualify; Completed 03/15/2016  Vision Screening: Recommended annual ophthalmology exams for early detection of glaucoma and other disorders of the eye. Is the patient up to date with their annual eye exam?  No  Who is the provider or what is the name of the office in which the patient attends annual eye exams? Does not have an eye doctor If pt is not established with a provider, would they like to be referred to a provider to establish care? No .   Dental Screening: Recommended annual dental exams for proper oral hygiene  Community Resource Referral / Chronic Care Management: CRR required this visit?  No   CCM required this visit?  No      Plan:     I have personally reviewed and noted the following in the patient's chart:   Medical and social history Use of alcohol, tobacco or illicit drugs  Current medications and supplements including opioid prescriptions. Patient is currently taking opioid prescriptions. Information provided to patient regarding non-opioid alternatives. Patient advised to discuss non-opioid treatment plan with their provider. Functional ability and status Nutritional status Physical activity Advanced directives List of other physicians Hospitalizations, surgeries, and ER visits in previous 12 months Vitals Screenings to include cognitive, depression, and falls Referrals and appointments  In addition, I have reviewed and discussed with patient certain preventive protocols, quality metrics, and best practice  recommendations. A written personalized care plan for preventive services as well as general preventive health recommendations were provided to patient.     Burnadette Pop, LPN   [id number]   Nurse Notes: Patient is due for eye exam, Shingrix vaccine, Flu vaccine, and Prevnar 20.

## 2022-03-01 NOTE — Telephone Encounter (Signed)
Here is a generic that short acting that he can try.

## 2022-03-01 NOTE — Patient Instructions (Signed)
  [name] , Thank you for taking time to come for your Medicare Wellness Visit. I appreciate your ongoing commitment to your health goals. Please review the following plan we discussed and let me know if I can assist you in the future.   These are the goals we discussed:  Goals      Exercise 150 min/wk Moderate Activity     Patient Stated     03/01/2022 AWV Goal: Exercise for General Health  Patient will verbalize understanding of the benefits of increased physical activity: Exercising regularly is important. It will improve your overall fitness, flexibility, and endurance. Regular exercise also will improve your overall health. It can help you control your weight, reduce stress, and improve your bone density. Over the next year, patient will increase physical activity as tolerated with a goal of at least 150 minutes of moderate physical activity per week.  You can tell that you are exercising at a moderate intensity if your heart starts beating faster and you start breathing faster but can still hold a conversation. Moderate-intensity exercise ideas include: Walking 1 mile (1.6 km) in about 15 minutes Biking Hiking Golfing Dancing Water aerobics Patient will verbalize understanding of everyday activities that increase physical activity by providing examples like the following: Yard work, such as: Sales promotion account executive Gardening Washing windows or floors Patient will be able to explain general safety guidelines for exercising:  Before you start a new exercise program, talk with your health care provider. Do not exercise so much that you hurt yourself, feel dizzy, or get very short of breath. Wear comfortable clothes and wear shoes with good support. Drink plenty of water while you exercise to prevent dehydration or heat stroke. Work out until your breathing and your heartbeat get faster.          This is a list of the screening recommended for you and due dates:  Health Maintenance  Topic Date Due   COVID-19 Vaccine (3 - 2023-24 season) 11/18/2021   Medicare Annual Wellness Visit  02/15/2022   Flu Shot  06/18/2022*   Zoster (Shingles) Vaccine (1 of 2) 12/04/2022*   Cologuard (Stool DNA test)  01/18/2024   DTaP/Tdap/Td vaccine (2 - Td or Tdap) 03/15/2026   Hepatitis C Screening: USPSTF Recommendation to screen - Ages 78-79 yo.  Completed   HIV Screening  Completed   HPV Vaccine  Aged Out  *Topic was postponed. The date shown is not the original due date.

## 2022-03-01 NOTE — Telephone Encounter (Signed)
Pt informed and understood. He will call his insurance to verify cost and call back if needed.

## 2022-03-01 NOTE — Telephone Encounter (Signed)
Patient's Detrol was $70 at [facility].  He is wondering if it can be sent in as a generic to [facility] so that the cost will be lower.  If it is too expensive there, he would like it to be sent to [facility] as a generic.

## 2022-03-09 ENCOUNTER — Ambulatory Visit: Payer: Medicare Other | Admitting: Family Medicine

## 2022-03-13 ENCOUNTER — Other Ambulatory Visit: Payer: Self-pay

## 2022-03-13 ENCOUNTER — Inpatient Hospital Stay (HOSPITAL_COMMUNITY)
Admission: EM | Admit: 2022-03-13 | Discharge: 2022-03-21 | DRG: 871 | Disposition: A | Discharge status: Home / Self Care | Payer: Medicare Other | Attending: Internal Medicine | Admitting: Internal Medicine

## 2022-03-13 ENCOUNTER — Emergency Department (HOSPITAL_COMMUNITY): Payer: Medicare Other

## 2022-03-13 DIAGNOSIS — S42109D Fracture of unspecified part of scapula, unspecified shoulder, subsequent encounter for fracture with routine healing: Secondary | ICD-10-CM

## 2022-03-13 DIAGNOSIS — K2091 Esophagitis, unspecified with bleeding: Secondary | ICD-10-CM | POA: Diagnosis not present

## 2022-03-13 DIAGNOSIS — N12 Tubulo-interstitial nephritis, not specified as acute or chronic: Secondary | ICD-10-CM | POA: Diagnosis present

## 2022-03-13 DIAGNOSIS — S82141D Displaced bicondylar fracture of right tibia, subsequent encounter for closed fracture with routine healing: Secondary | ICD-10-CM

## 2022-03-13 DIAGNOSIS — Y92410 Unspecified street and highway as the place of occurrence of the external cause: Secondary | ICD-10-CM | POA: Diagnosis not present

## 2022-03-13 DIAGNOSIS — K259 Gastric ulcer, unspecified as acute or chronic, without hemorrhage or perforation: Secondary | ICD-10-CM | POA: Diagnosis not present

## 2022-03-13 DIAGNOSIS — K2970 Gastritis, unspecified, without bleeding: Secondary | ICD-10-CM | POA: Diagnosis not present

## 2022-03-13 DIAGNOSIS — Z1152 Encounter for screening for COVID-19: Secondary | ICD-10-CM

## 2022-03-13 DIAGNOSIS — K222 Esophageal obstruction: Secondary | ICD-10-CM | POA: Diagnosis not present

## 2022-03-13 DIAGNOSIS — E876 Hypokalemia: Secondary | ICD-10-CM | POA: Diagnosis not present

## 2022-03-13 DIAGNOSIS — R569 Unspecified convulsions: Secondary | ICD-10-CM

## 2022-03-13 DIAGNOSIS — D61818 Other pancytopenia: Secondary | ICD-10-CM | POA: Diagnosis present

## 2022-03-13 DIAGNOSIS — K529 Noninfective gastroenteritis and colitis, unspecified: Secondary | ICD-10-CM | POA: Diagnosis present

## 2022-03-13 DIAGNOSIS — K254 Chronic or unspecified gastric ulcer with hemorrhage: Secondary | ICD-10-CM | POA: Diagnosis present

## 2022-03-13 DIAGNOSIS — L89152 Pressure ulcer of sacral region, stage 2: Secondary | ICD-10-CM | POA: Diagnosis not present

## 2022-03-13 DIAGNOSIS — R0902 Hypoxemia: Secondary | ICD-10-CM | POA: Diagnosis not present

## 2022-03-13 DIAGNOSIS — I5022 Chronic systolic (congestive) heart failure: Secondary | ICD-10-CM | POA: Diagnosis not present

## 2022-03-13 DIAGNOSIS — R1319 Other dysphagia: Secondary | ICD-10-CM

## 2022-03-13 DIAGNOSIS — K760 Fatty (change of) liver, not elsewhere classified: Secondary | ICD-10-CM | POA: Diagnosis present

## 2022-03-13 DIAGNOSIS — D649 Anemia, unspecified: Secondary | ICD-10-CM | POA: Diagnosis present

## 2022-03-13 DIAGNOSIS — D62 Acute posthemorrhagic anemia: Secondary | ICD-10-CM | POA: Diagnosis not present

## 2022-03-13 DIAGNOSIS — A4151 Sepsis due to Escherichia coli [E. coli]: Principal | ICD-10-CM | POA: Diagnosis present

## 2022-03-13 DIAGNOSIS — I951 Orthostatic hypotension: Secondary | ICD-10-CM | POA: Diagnosis present

## 2022-03-13 DIAGNOSIS — Z825 Family history of asthma and other chronic lower respiratory diseases: Secondary | ICD-10-CM

## 2022-03-13 DIAGNOSIS — R739 Hyperglycemia, unspecified: Secondary | ICD-10-CM | POA: Diagnosis present

## 2022-03-13 DIAGNOSIS — R6889 Other general symptoms and signs: Secondary | ICD-10-CM | POA: Diagnosis not present

## 2022-03-13 DIAGNOSIS — N179 Acute kidney failure, unspecified: Principal | ICD-10-CM | POA: Diagnosis present

## 2022-03-13 DIAGNOSIS — Z79899 Other long term (current) drug therapy: Secondary | ICD-10-CM

## 2022-03-13 DIAGNOSIS — A4150 Gram-negative sepsis, unspecified: Secondary | ICD-10-CM | POA: Diagnosis not present

## 2022-03-13 DIAGNOSIS — I110 Hypertensive heart disease with heart failure: Secondary | ICD-10-CM | POA: Diagnosis not present

## 2022-03-13 DIAGNOSIS — S42021A Displaced fracture of shaft of right clavicle, initial encounter for closed fracture: Secondary | ICD-10-CM | POA: Diagnosis not present

## 2022-03-13 DIAGNOSIS — K921 Melena: Secondary | ICD-10-CM | POA: Diagnosis not present

## 2022-03-13 DIAGNOSIS — Z791 Long term (current) use of non-steroidal anti-inflammatories (NSAID): Secondary | ICD-10-CM

## 2022-03-13 DIAGNOSIS — E785 Hyperlipidemia, unspecified: Secondary | ICD-10-CM | POA: Diagnosis present

## 2022-03-13 DIAGNOSIS — G8929 Other chronic pain: Secondary | ICD-10-CM | POA: Diagnosis present

## 2022-03-13 DIAGNOSIS — Z8546 Personal history of malignant neoplasm of prostate: Secondary | ICD-10-CM

## 2022-03-13 DIAGNOSIS — N281 Cyst of kidney, acquired: Secondary | ICD-10-CM | POA: Diagnosis not present

## 2022-03-13 DIAGNOSIS — R404 Transient alteration of awareness: Secondary | ICD-10-CM | POA: Diagnosis not present

## 2022-03-13 DIAGNOSIS — S42001D Fracture of unspecified part of right clavicle, subsequent encounter for fracture with routine healing: Secondary | ICD-10-CM

## 2022-03-13 DIAGNOSIS — Z83719 Family history of colon polyps, unspecified: Secondary | ICD-10-CM

## 2022-03-13 DIAGNOSIS — G9341 Metabolic encephalopathy: Secondary | ICD-10-CM | POA: Diagnosis present

## 2022-03-13 DIAGNOSIS — S299XXA Unspecified injury of thorax, initial encounter: Secondary | ICD-10-CM | POA: Diagnosis not present

## 2022-03-13 DIAGNOSIS — E860 Dehydration: Secondary | ICD-10-CM | POA: Diagnosis present

## 2022-03-13 DIAGNOSIS — K209 Esophagitis, unspecified without bleeding: Secondary | ICD-10-CM | POA: Diagnosis not present

## 2022-03-13 DIAGNOSIS — R197 Diarrhea, unspecified: Secondary | ICD-10-CM

## 2022-03-13 DIAGNOSIS — I10 Essential (primary) hypertension: Secondary | ICD-10-CM | POA: Diagnosis not present

## 2022-03-13 DIAGNOSIS — Z743 Need for continuous supervision: Secondary | ICD-10-CM | POA: Diagnosis not present

## 2022-03-13 DIAGNOSIS — E782 Mixed hyperlipidemia: Secondary | ICD-10-CM | POA: Diagnosis not present

## 2022-03-13 DIAGNOSIS — Z8042 Family history of malignant neoplasm of prostate: Secondary | ICD-10-CM

## 2022-03-13 DIAGNOSIS — R195 Other fecal abnormalities: Secondary | ICD-10-CM | POA: Insufficient documentation

## 2022-03-13 DIAGNOSIS — R509 Fever, unspecified: Secondary | ICD-10-CM | POA: Diagnosis present

## 2022-03-13 DIAGNOSIS — I429 Cardiomyopathy, unspecified: Secondary | ICD-10-CM

## 2022-03-13 DIAGNOSIS — I428 Other cardiomyopathies: Secondary | ICD-10-CM | POA: Diagnosis not present

## 2022-03-13 DIAGNOSIS — R7881 Bacteremia: Secondary | ICD-10-CM | POA: Insufficient documentation

## 2022-03-13 DIAGNOSIS — R000 Tachycardia, unspecified: Secondary | ICD-10-CM | POA: Diagnosis not present

## 2022-03-13 DIAGNOSIS — B9620 Unspecified Escherichia coli [E. coli] as the cause of diseases classified elsewhere: Secondary | ICD-10-CM | POA: Diagnosis not present

## 2022-03-13 DIAGNOSIS — M1990 Unspecified osteoarthritis, unspecified site: Secondary | ICD-10-CM | POA: Diagnosis not present

## 2022-03-13 DIAGNOSIS — Z8249 Family history of ischemic heart disease and other diseases of the circulatory system: Secondary | ICD-10-CM | POA: Diagnosis not present

## 2022-03-13 DIAGNOSIS — R6520 Severe sepsis without septic shock: Secondary | ICD-10-CM | POA: Diagnosis present

## 2022-03-13 DIAGNOSIS — I499 Cardiac arrhythmia, unspecified: Secondary | ICD-10-CM | POA: Diagnosis not present

## 2022-03-13 DIAGNOSIS — Z833 Family history of diabetes mellitus: Secondary | ICD-10-CM

## 2022-03-13 DIAGNOSIS — A419 Sepsis, unspecified organism: Secondary | ICD-10-CM | POA: Diagnosis not present

## 2022-03-13 LAB — URINALYSIS, ROUTINE W REFLEX MICROSCOPIC
Bilirubin Urine: NEGATIVE
Glucose, UA: NEGATIVE mg/dL
Ketones, ur: 5 mg/dL — AB
Nitrite: NEGATIVE
Protein, ur: 100 mg/dL — AB
Specific Gravity, Urine: 1.012 (ref 1.005–1.030)
pH: 6 (ref 5.0–8.0)

## 2022-03-13 LAB — CBC WITH DIFFERENTIAL/PLATELET
Abs Immature Granulocytes: 0.1 10*3/uL — ABNORMAL HIGH (ref 0.00–0.07)
Band Neutrophils: 3 %
Basophils Absolute: 0 10*3/uL (ref 0.0–0.1)
Basophils Relative: 0 %
Eosinophils Absolute: 0 10*3/uL (ref 0.0–0.5)
Eosinophils Relative: 0 %
HCT: 32.6 % — ABNORMAL LOW (ref 39.0–52.0)
Hemoglobin: 11.1 g/dL — ABNORMAL LOW (ref 13.0–17.0)
Lymphocytes Relative: 6 %
Lymphs Abs: 0.1 10*3/uL — ABNORMAL LOW (ref 0.7–4.0)
MCH: 31.2 pg (ref 26.0–34.0)
MCHC: 34 g/dL (ref 30.0–36.0)
MCV: 91.6 fL (ref 80.0–100.0)
Metamyelocytes Relative: 3 %
Monocytes Absolute: 0.2 10*3/uL (ref 0.1–1.0)
Monocytes Relative: 9 %
Neutro Abs: 1.7 10*3/uL (ref 1.7–7.7)
Neutrophils Relative %: 79 %
Platelets: 137 10*3/uL — ABNORMAL LOW (ref 150–400)
RBC: 3.56 MIL/uL — ABNORMAL LOW (ref 4.22–5.81)
RDW: 13.3 % (ref 11.5–15.5)
WBC: 2.1 10*3/uL — ABNORMAL LOW (ref 4.0–10.5)
nRBC: 0 % (ref 0.0–0.2)

## 2022-03-13 LAB — COMPREHENSIVE METABOLIC PANEL
ALT: 56 U/L — ABNORMAL HIGH (ref 0–44)
AST: 174 U/L — ABNORMAL HIGH (ref 15–41)
Albumin: 2.9 g/dL — ABNORMAL LOW (ref 3.5–5.0)
Alkaline Phosphatase: 214 U/L — ABNORMAL HIGH (ref 38–126)
Anion gap: 14 (ref 5–15)
BUN: 66 mg/dL — ABNORMAL HIGH (ref 6–20)
CO2: 18 mmol/L — ABNORMAL LOW (ref 22–32)
Calcium: 8.6 mg/dL — ABNORMAL LOW (ref 8.9–10.3)
Chloride: 106 mmol/L (ref 98–111)
Creatinine, Ser: 3.16 mg/dL — ABNORMAL HIGH (ref 0.61–1.24)
GFR, Estimated: 22 mL/min — ABNORMAL LOW (ref >60)
Glucose, Bld: 171 mg/dL — ABNORMAL HIGH (ref 70–99)
Potassium: 3.2 mmol/L — ABNORMAL LOW (ref 3.5–5.1)
Sodium: 138 mmol/L (ref 135–145)
Total Bilirubin: 1.1 mg/dL (ref 0.3–1.2)
Total Protein: 7.2 g/dL (ref 6.5–8.1)

## 2022-03-13 LAB — RAPID URINE DRUG SCREEN, HOSP PERFORMED
Amphetamines: NOT DETECTED
Barbiturates: POSITIVE — AB
Benzodiazepines: NOT DETECTED
Cocaine: NOT DETECTED
Opiates: NOT DETECTED
Tetrahydrocannabinol: NOT DETECTED

## 2022-03-13 LAB — RESP PANEL BY RT-PCR (RSV, FLU A&B, COVID)  RVPGX2
Influenza A by PCR: NEGATIVE
Influenza B by PCR: NEGATIVE
Resp Syncytial Virus by PCR: NEGATIVE
SARS Coronavirus 2 by RT PCR: NEGATIVE

## 2022-03-13 LAB — PROCALCITONIN: Procalcitonin: 79.21 ng/mL

## 2022-03-13 LAB — CK: Total CK: 331 U/L (ref 49–397)

## 2022-03-13 LAB — HIV ANTIBODY (ROUTINE TESTING W REFLEX): HIV Screen 4th Generation wRfx: NONREACTIVE

## 2022-03-13 LAB — APTT: aPTT: 35 seconds (ref 24–36)

## 2022-03-13 LAB — PROTIME-INR
INR: 1.3 — ABNORMAL HIGH (ref 0.8–1.2)
Prothrombin Time: 15.9 seconds — ABNORMAL HIGH (ref 11.4–15.2)

## 2022-03-13 LAB — LACTIC ACID, PLASMA: Lactic Acid, Venous: 1.7 mmol/L (ref 0.5–1.9)

## 2022-03-13 LAB — MAGNESIUM: Magnesium: 2.1 mg/dL (ref 1.7–2.4)

## 2022-03-13 MED ORDER — POTASSIUM CHLORIDE 10 MEQ/100ML IV SOLN
10.0000 meq | INTRAVENOUS | Status: AC
  Administered 2022-03-13 (×2): 10 meq via INTRAVENOUS
  Filled 2022-03-13 (×2): qty 100

## 2022-03-13 MED ORDER — ACETAMINOPHEN 325 MG PO TABS
650.0000 mg | ORAL_TABLET | ORAL | Status: DC
  Administered 2022-03-13 – 2022-03-21 (×35): 650 mg via ORAL
  Filled 2022-03-13 (×40): qty 2

## 2022-03-13 MED ORDER — ONDANSETRON HCL 4 MG PO TABS: 4.0000 mg | ORAL_TABLET | Freq: Four times a day (QID) | ORAL | Status: DC | PRN

## 2022-03-13 MED ORDER — HYDRALAZINE HCL 20 MG/ML IJ SOLN: 10.0000 mg | INTRAMUSCULAR | Status: DC | PRN

## 2022-03-13 MED ORDER — FENTANYL CITRATE PF 50 MCG/ML IJ SOSY
25.0000 ug | PREFILLED_SYRINGE | INTRAMUSCULAR | Status: DC | PRN
  Administered 2022-03-15 – 2022-03-16 (×2): 25 ug via INTRAVENOUS
  Filled 2022-03-13 (×2): qty 1

## 2022-03-13 MED ORDER — ACETAMINOPHEN 650 MG RE SUPP
650.0000 mg | RECTAL | Status: DC
  Filled 2022-03-13 (×2): qty 1

## 2022-03-13 MED ORDER — SODIUM CHLORIDE 0.9 % IV BOLUS (SEPSIS): 1000.0000 mL | Freq: Once | INTRAVENOUS | Status: DC

## 2022-03-13 MED ORDER — SODIUM CHLORIDE 0.9 % IV SOLN
2.0000 g | Freq: Once | INTRAVENOUS | Status: AC
  Administered 2022-03-13: 2 g via INTRAVENOUS
  Filled 2022-03-13: qty 12.5

## 2022-03-13 MED ORDER — ENOXAPARIN SODIUM 30 MG/0.3ML IJ SOSY
30.0000 mg | PREFILLED_SYRINGE | INTRAMUSCULAR | Status: DC
  Administered 2022-03-13: 30 mg via SUBCUTANEOUS
  Filled 2022-03-13: qty 0.3

## 2022-03-13 MED ORDER — METRONIDAZOLE 500 MG/100ML IV SOLN
500.0000 mg | Freq: Once | INTRAVENOUS | Status: AC
  Administered 2022-03-13: 500 mg via INTRAVENOUS
  Filled 2022-03-13: qty 100

## 2022-03-13 MED ORDER — ONDANSETRON HCL 4 MG/2ML IJ SOLN
4.0000 mg | Freq: Four times a day (QID) | INTRAMUSCULAR | Status: DC | PRN
  Administered 2022-03-15 – 2022-03-16 (×4): 4 mg via INTRAVENOUS
  Filled 2022-03-13 (×3): qty 2

## 2022-03-13 MED ORDER — VANCOMYCIN HCL IN DEXTROSE 1-5 GM/200ML-% IV SOLN: 1000.0000 mg | Freq: Once | INTRAVENOUS | Status: DC

## 2022-03-13 MED ORDER — LACTATED RINGERS IV BOLUS (SEPSIS)
1000.0000 mL | Freq: Once | INTRAVENOUS | Status: AC
  Administered 2022-03-13: 1000 mL via INTRAVENOUS

## 2022-03-13 MED ORDER — ACETAMINOPHEN 500 MG PO TABS
ORAL_TABLET | ORAL | Status: AC
  Filled 2022-03-13: qty 2

## 2022-03-13 MED ORDER — TRAZODONE HCL 50 MG PO TABS
25.0000 mg | ORAL_TABLET | Freq: Every evening | ORAL | Status: DC | PRN
  Administered 2022-03-17 – 2022-03-19 (×3): 25 mg via ORAL
  Filled 2022-03-13 (×4): qty 1

## 2022-03-13 MED ORDER — LACTATED RINGERS IV SOLN: INTRAVENOUS | Status: DC

## 2022-03-13 MED ORDER — ACETAMINOPHEN 500 MG PO TABS
1000.0000 mg | ORAL_TABLET | Freq: Once | ORAL | Status: AC
  Administered 2022-03-13: 1000 mg via ORAL
  Filled 2022-03-13: qty 2

## 2022-03-13 MED ORDER — SODIUM CHLORIDE 0.9 % IV SOLN: INTRAVENOUS | Status: DC

## 2022-03-13 MED ORDER — LACTATED RINGERS IV BOLUS
1000.0000 mL | Freq: Once | INTRAVENOUS | Status: AC
  Administered 2022-03-13: 1000 mL via INTRAVENOUS

## 2022-03-13 MED ORDER — SODIUM CHLORIDE 0.9 % IV SOLN: 2.0000 g | INTRAVENOUS | Status: DC

## 2022-03-13 MED ORDER — VANCOMYCIN HCL 1500 MG/300ML IV SOLN: 1500.0000 mg | INTRAVENOUS | Status: DC

## 2022-03-13 MED ORDER — VANCOMYCIN HCL 2000 MG/400ML IV SOLN
2000.0000 mg | Freq: Once | INTRAVENOUS | Status: AC
  Administered 2022-03-13: 2000 mg via INTRAVENOUS
  Filled 2022-03-13: qty 400

## 2022-03-13 MED ORDER — OXYCODONE HCL 5 MG PO TABS
5.0000 mg | ORAL_TABLET | ORAL | Status: DC | PRN
  Administered 2022-03-14 – 2022-03-18 (×8): 5 mg via ORAL
  Filled 2022-03-13 (×9): qty 1

## 2022-03-13 MED ORDER — PANTOPRAZOLE SODIUM 40 MG PO TBEC
40.0000 mg | DELAYED_RELEASE_TABLET | Freq: Every day | ORAL | Status: DC
  Administered 2022-03-14 – 2022-03-16 (×2): 40 mg via ORAL
  Filled 2022-03-13 (×2): qty 1

## 2022-03-13 NOTE — ED Notes (Addendum)
Significant drop in BP noted. Pt reassessed w/ no significant change noted.

## 2022-03-13 NOTE — ED Notes (Signed)
Patient transported to CT 
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
## 2022-03-13 NOTE — Plan of Care (Signed)
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
## 2022-03-13 NOTE — Sepsis Progress Note (Signed)
Elink tracking the Code Sepsis. 

## 2022-03-13 NOTE — ED Notes (Signed)
Pt noted to be confused and responds inappropriately to most questions. Friend w/ patient reports that he is not at his baseline orientation and mental status. This was first noticed today.

## 2022-03-13 NOTE — ED Notes (Signed)
As this NT was completing the bladder scan, pt started to urinate; 500 ml noted in canister after pt finished urinating; 53m was highest amount noted on bladder scan after pt urinated.

## 2022-03-13 NOTE — Progress Notes (Signed)
Pharmacy Antibiotic Note  Christopher Salas is a 56 y.o. male admitted on 03/13/2022 with  unknown source of infection .  Pharmacy has been consulted for vancomycin and cefepime dosing.  Plan: Vancomycin 2000 mg IV x 1 dose. Vancomycin 1500 mg IV every 48 hours. Cefepime 2000 mg IV every 24 hours. Monitor labs, c/s, and vanco level as indidcated.  Height: 6' (182.9 cm) Weight: 90.7 kg (200 lb) IBW/kg (Calculated) : 77.6  Temp (24hrs), Avg:103.4 F (39.7 C), Min:103.4 F (39.7 C), Max:103.4 F (39.7 C)  Recent Labs  Lab 03/13/22 1134  WBC 2.1*  CREATININE 3.16*  LATICACIDVEN 1.7    Estimated Creatinine Clearance: 28 mL/min (A) (by C-G formula based on SCr of 3.16 mg/dL (H)).    No Known Allergies  Antimicrobials this admission: Vanco 12/25 >> Cefepime 12/25 >>  Microbiology results: 12/25 BCx: pending 12/25 UCx: pending   Thank you for allowing pharmacy to be a part of this patient's care.  Margot Ables, PharmD Clinical Pharmacist 03/13/2022 12:11 PM
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
## 2022-03-14 ENCOUNTER — Inpatient Hospital Stay (HOSPITAL_COMMUNITY): Payer: Medicare Other

## 2022-03-14 ENCOUNTER — Encounter (HOSPITAL_COMMUNITY): Payer: Self-pay | Admitting: Family Medicine

## 2022-03-14 DIAGNOSIS — R739 Hyperglycemia, unspecified: Secondary | ICD-10-CM | POA: Diagnosis not present

## 2022-03-14 DIAGNOSIS — K529 Noninfective gastroenteritis and colitis, unspecified: Secondary | ICD-10-CM | POA: Diagnosis not present

## 2022-03-14 DIAGNOSIS — A419 Sepsis, unspecified organism: Secondary | ICD-10-CM | POA: Diagnosis not present

## 2022-03-14 DIAGNOSIS — N179 Acute kidney failure, unspecified: Secondary | ICD-10-CM | POA: Diagnosis not present

## 2022-03-14 DIAGNOSIS — I428 Other cardiomyopathies: Secondary | ICD-10-CM | POA: Diagnosis not present

## 2022-03-14 DIAGNOSIS — E782 Mixed hyperlipidemia: Secondary | ICD-10-CM

## 2022-03-14 LAB — ECHOCARDIOGRAM COMPLETE
AR max vel: 2.27 cm2
AV Area VTI: 2.12 cm2
AV Area mean vel: 2.11 cm2
AV Mean grad: 7 mmHg
AV Peak grad: 11.6 mmHg
Ao pk vel: 1.7 m/s
Area-P 1/2: 4.19 cm2
Height: 72 in
MV VTI: 2.24 cm2
S' Lateral: 4.3 cm
Weight: 3820.13 oz

## 2022-03-14 LAB — BLOOD CULTURE ID PANEL (REFLEXED) - BCID2

## 2022-03-14 LAB — COMPREHENSIVE METABOLIC PANEL
ALT: 42 U/L (ref 0–44)
AST: 125 U/L — ABNORMAL HIGH (ref 15–41)
Albumin: 2.5 g/dL — ABNORMAL LOW (ref 3.5–5.0)
Alkaline Phosphatase: 159 U/L — ABNORMAL HIGH (ref 38–126)
Anion gap: 8 (ref 5–15)
BUN: 48 mg/dL — ABNORMAL HIGH (ref 6–20)
CO2: 23 mmol/L (ref 22–32)
Calcium: 8.6 mg/dL — ABNORMAL LOW (ref 8.9–10.3)
Chloride: 110 mmol/L (ref 98–111)
Creatinine, Ser: 2.06 mg/dL — ABNORMAL HIGH (ref 0.61–1.24)
GFR, Estimated: 37 mL/min — ABNORMAL LOW (ref >60)
Glucose, Bld: 104 mg/dL — ABNORMAL HIGH (ref 70–99)
Potassium: 4 mmol/L (ref 3.5–5.1)
Sodium: 141 mmol/L (ref 135–145)
Total Bilirubin: 0.8 mg/dL (ref 0.3–1.2)
Total Protein: 6.2 g/dL — ABNORMAL LOW (ref 6.5–8.1)

## 2022-03-14 LAB — FOLATE: Folate: 16.6 ng/mL (ref >5.9)

## 2022-03-14 LAB — IRON AND TIBC
Iron: 16 ug/dL — ABNORMAL LOW (ref 45–182)
Saturation Ratios: 8 % — ABNORMAL LOW (ref 17.9–39.5)
TIBC: 212 ug/dL — ABNORMAL LOW (ref 250–450)
UIBC: 196 ug/dL

## 2022-03-14 LAB — CBC
HCT: 30.2 % — ABNORMAL LOW (ref 39.0–52.0)
Hemoglobin: 9.9 g/dL — ABNORMAL LOW (ref 13.0–17.0)
MCH: 31 pg (ref 26.0–34.0)
MCHC: 32.8 g/dL (ref 30.0–36.0)
MCV: 94.7 fL (ref 80.0–100.0)
Platelets: 121 10*3/uL — ABNORMAL LOW (ref 150–400)
RBC: 3.19 MIL/uL — ABNORMAL LOW (ref 4.22–5.81)
RDW: 13.8 % (ref 11.5–15.5)
WBC: 3.4 10*3/uL — ABNORMAL LOW (ref 4.0–10.5)
nRBC: 0 % (ref 0.0–0.2)

## 2022-03-14 LAB — MRSA NEXT GEN BY PCR, NASAL: MRSA by PCR Next Gen: DETECTED — AB

## 2022-03-14 LAB — URINE CULTURE: Culture: NO GROWTH

## 2022-03-14 LAB — RETICULOCYTES
Immature Retic Fract: 6.9 % (ref 2.3–15.9)
RBC.: 3.2 MIL/uL — ABNORMAL LOW (ref 4.22–5.81)
Retic Count, Absolute: 18.9 10*3/uL — ABNORMAL LOW (ref 19.0–186.0)
Retic Ct Pct: 0.6 % (ref 0.4–3.1)

## 2022-03-14 LAB — VITAMIN B12: Vitamin B-12: 1141 pg/mL — ABNORMAL HIGH (ref 180–914)

## 2022-03-14 LAB — PROCALCITONIN: Procalcitonin: 103.61 ng/mL

## 2022-03-14 LAB — MAGNESIUM: Magnesium: 2.1 mg/dL (ref 1.7–2.4)

## 2022-03-14 LAB — FERRITIN: Ferritin: 1214 ng/mL — ABNORMAL HIGH (ref 24–336)

## 2022-03-14 MED ORDER — GABAPENTIN 100 MG PO CAPS
200.0000 mg | ORAL_CAPSULE | Freq: Three times a day (TID) | ORAL | Status: DC | PRN
  Administered 2022-03-14: 200 mg via ORAL
  Filled 2022-03-14: qty 2

## 2022-03-14 MED ORDER — SODIUM CHLORIDE 0.9 % IV SOLN
1.0000 g | Freq: Once | INTRAVENOUS | Status: AC
  Administered 2022-03-14: 1 g via INTRAVENOUS
  Filled 2022-03-14: qty 20

## 2022-03-14 MED ORDER — FENOFIBRATE 54 MG PO TABS
54.0000 mg | ORAL_TABLET | Freq: Every day | ORAL | Status: DC
  Administered 2022-03-15 – 2022-03-20 (×4): 54 mg via ORAL
  Filled 2022-03-14 (×10): qty 1

## 2022-03-14 MED ORDER — SODIUM CHLORIDE 0.9 % IV SOLN
2.0000 g | INTRAVENOUS | Status: DC
  Administered 2022-03-14 – 2022-03-20 (×7): 2 g via INTRAVENOUS
  Filled 2022-03-14 (×7): qty 20

## 2022-03-14 MED ORDER — CHLORHEXIDINE GLUCONATE CLOTH 2 % EX PADS
6.0000 | MEDICATED_PAD | Freq: Every day | CUTANEOUS | Status: DC
  Administered 2022-03-14 – 2022-03-20 (×6): 6 via TOPICAL

## 2022-03-14 MED ORDER — PHENOBARBITAL 97.2 MG PO TABS
100.0000 mg | ORAL_TABLET | Freq: Every day | ORAL | Status: DC
  Administered 2022-03-14 – 2022-03-20 (×7): 97.2 mg via ORAL
  Filled 2022-03-14 (×7): qty 1

## 2022-03-14 MED ORDER — PHENOBARBITAL 32.4 MG PO TABS
32.4000 mg | ORAL_TABLET | Freq: Every day | ORAL | Status: DC
  Administered 2022-03-14 – 2022-03-20 (×7): 32.4 mg via ORAL
  Filled 2022-03-14 (×7): qty 1

## 2022-03-14 MED ORDER — MUPIROCIN 2 % EX OINT
TOPICAL_OINTMENT | Freq: Two times a day (BID) | CUTANEOUS | Status: DC
  Administered 2022-03-19: 1 via NASAL
  Filled 2022-03-14 (×3): qty 22

## 2022-03-14 MED ORDER — FESOTERODINE FUMARATE ER 4 MG PO TB24
4.0000 mg | ORAL_TABLET | Freq: Every day | ORAL | Status: DC
  Administered 2022-03-14 – 2022-03-20 (×6): 4 mg via ORAL
  Filled 2022-03-14 (×11): qty 1

## 2022-03-14 MED ORDER — ENOXAPARIN SODIUM 40 MG/0.4ML IJ SOSY
40.0000 mg | PREFILLED_SYRINGE | INTRAMUSCULAR | Status: DC
  Administered 2022-03-14 – 2022-03-15 (×2): 40 mg via SUBCUTANEOUS
  Filled 2022-03-14 (×3): qty 0.4

## 2022-03-14 MED ORDER — PERFLUTREN LIPID MICROSPHERE
1.0000 mL | INTRAVENOUS | Status: AC | PRN
  Administered 2022-03-14: 9 mL via INTRAVENOUS

## 2022-03-14 MED ORDER — ROSUVASTATIN CALCIUM 10 MG PO TABS
10.0000 mg | ORAL_TABLET | Freq: Every day | ORAL | Status: DC
  Administered 2022-03-15 – 2022-03-20 (×5): 10 mg via ORAL
  Filled 2022-03-14 (×7): qty 1

## 2022-03-14 NOTE — Progress Notes (Signed)
*  PRELIMINARY RESULTS* Echocardiogram 2D Echocardiogram has been performed.  Christopher [facility]as 03/14/2022, 3:11 PM
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
## 2022-03-14 NOTE — Progress Notes (Signed)
PHARMACY - PHYSICIAN COMMUNICATION CRITICAL VALUE ALERT - BLOOD CULTURE IDENTIFICATION (BCID)  Christopher Salas is an 56 y.o. male who presented to [place] on 03/13/2022   Assessment:  e. Coli bacteremia. BCID with no resistance  Name of physician (or Provider) Contacted: Dr. Wynetta Emery  Current antibiotics: Meropenem   Changes to prescribed antibiotics recommended:  Recommendations accepted by provider - de-escalate to CTX  Results for orders placed or performed during the hospital encounter of 03/13/22  Blood Culture ID Panel (Reflexed) (Collected: 03/13/2022 11:34 AM)  Result Value Ref Range   Enterococcus faecalis NOT DETECTED NOT DETECTED   Enterococcus Faecium NOT DETECTED NOT DETECTED   Listeria monocytogenes NOT DETECTED NOT DETECTED   Staphylococcus species NOT DETECTED NOT DETECTED   Staphylococcus aureus (BCID) NOT DETECTED NOT DETECTED   Staphylococcus epidermidis NOT DETECTED NOT DETECTED   Staphylococcus lugdunensis NOT DETECTED NOT DETECTED   Streptococcus species NOT DETECTED NOT DETECTED   Streptococcus agalactiae NOT DETECTED NOT DETECTED   Streptococcus pneumoniae NOT DETECTED NOT DETECTED   Streptococcus pyogenes NOT DETECTED NOT DETECTED   A.calcoaceticus-baumannii NOT DETECTED NOT DETECTED   Bacteroides fragilis NOT DETECTED NOT DETECTED   Enterobacterales DETECTED (A) NOT DETECTED   Enterobacter cloacae complex NOT DETECTED NOT DETECTED   Escherichia coli DETECTED (A) NOT DETECTED   Klebsiella aerogenes NOT DETECTED NOT DETECTED   Klebsiella oxytoca NOT DETECTED NOT DETECTED   Klebsiella pneumoniae NOT DETECTED NOT DETECTED   Proteus species NOT DETECTED NOT DETECTED   Salmonella species NOT DETECTED NOT DETECTED   Serratia marcescens NOT DETECTED NOT DETECTED   Haemophilus influenzae NOT DETECTED NOT DETECTED   Neisseria meningitidis NOT DETECTED NOT DETECTED   Pseudomonas aeruginosa NOT DETECTED NOT DETECTED   Stenotrophomonas maltophilia NOT DETECTED  NOT DETECTED   Candida albicans NOT DETECTED NOT DETECTED   Candida auris NOT DETECTED NOT DETECTED   Candida glabrata NOT DETECTED NOT DETECTED   Candida krusei NOT DETECTED NOT DETECTED   Candida parapsilosis NOT DETECTED NOT DETECTED   Candida tropicalis NOT DETECTED NOT DETECTED   Cryptococcus neoformans/gattii NOT DETECTED NOT DETECTED   CTX-M ESBL NOT DETECTED NOT DETECTED   Carbapenem resistance IMP NOT DETECTED NOT DETECTED   Carbapenem resistance KPC NOT DETECTED NOT DETECTED   Carbapenem resistance NDM NOT DETECTED NOT DETECTED   Carbapenem resist OXA 48 LIKE NOT DETECTED NOT DETECTED   Carbapenem resistance VIM NOT DETECTED NOT DETECTED    Ramond Craver 03/14/2022  1:17 PM

## 2022-03-14 NOTE — TOC Progression Note (Signed)
  Transition of Care [facility]) Screening Note   Patient Details  Name: Christopher Salas Date of Birth: [birth year]   Transition of Care [facility]) CM/SW Contact:    Boneta Lucks, RN Phone Number: 03/14/2022, 2:30 PM  Code Sepsis called. TOC to follow, Patient lives alone.   Transition of Care Department [facility]) has reviewed patient and no TOC needs have been identified at this time. We will continue to monitor patient advancement through interdisciplinary progression rounds. If new patient transition needs arise, please place a TOC consult.     Barriers to Discharge: Continued Medical Work up  Expected Discharge Plan and Services      Living arrangements for the past 2 months: [place] Determinants of Health (SDOH) Interventions [place]: No Food Insecurity (03/13/2022)  Housing: Low Risk  (03/13/2022)  Transportation Needs: No Transportation Needs (03/13/2022)  Utilities: Not At Risk (03/13/2022)  Alcohol Screen: Low Risk  (02/15/2021)  Depression (PHQ2-9): Low Risk  (03/01/2022)  Recent Concern: Depression (PHQ2-9) - Medium Risk (12/07/2021)  Financial Resource Strain: Low Risk  (03/01/2022)  Physical Activity: Inactive (03/01/2022)  Social Connections: Moderately Isolated (03/01/2022)  Stress: No Stress Concern Present (03/01/2022)  Tobacco Use: Low Risk  (03/14/2022)

## 2022-03-14 NOTE — Progress Notes (Signed)
Oxycodone and gabapentin given this shift for chronic pain in head/back.  No stools at all since admission, GI panel cancelled.  Pt has not eaten until this evening, ate 2 icee's.  Tolerating IV ABX.  T max 100.3 this shift.  Mupirocin started for + MRSA.  NSR on monitor.  Remains on 3L at this time.

## 2022-03-15 DIAGNOSIS — N179 Acute kidney failure, unspecified: Secondary | ICD-10-CM | POA: Diagnosis not present

## 2022-03-15 DIAGNOSIS — A4150 Gram-negative sepsis, unspecified: Secondary | ICD-10-CM | POA: Insufficient documentation

## 2022-03-15 DIAGNOSIS — R569 Unspecified convulsions: Secondary | ICD-10-CM | POA: Diagnosis not present

## 2022-03-15 DIAGNOSIS — R7881 Bacteremia: Secondary | ICD-10-CM | POA: Diagnosis not present

## 2022-03-15 DIAGNOSIS — B9620 Unspecified Escherichia coli [E. coli] as the cause of diseases classified elsewhere: Secondary | ICD-10-CM

## 2022-03-15 LAB — URINALYSIS, COMPLETE (UACMP) WITH MICROSCOPIC
Bacteria, UA: NONE SEEN
Bilirubin Urine: NEGATIVE
Glucose, UA: 50 mg/dL — AB
Ketones, ur: 20 mg/dL — AB
Leukocytes,Ua: NEGATIVE
Nitrite: NEGATIVE
Protein, ur: 100 mg/dL — AB
Specific Gravity, Urine: 1.012 (ref 1.005–1.030)
pH: 6 (ref 5.0–8.0)

## 2022-03-15 LAB — CBC
HCT: 24.8 % — ABNORMAL LOW (ref 39.0–52.0)
Hemoglobin: 8.3 g/dL — ABNORMAL LOW (ref 13.0–17.0)
MCH: 30.5 pg (ref 26.0–34.0)
MCHC: 33.5 g/dL (ref 30.0–36.0)
MCV: 91.2 fL (ref 80.0–100.0)
Platelets: 145 10*3/uL — ABNORMAL LOW (ref 150–400)
RBC: 2.72 MIL/uL — ABNORMAL LOW (ref 4.22–5.81)
RDW: 13.7 % (ref 11.5–15.5)
WBC: 4.3 10*3/uL (ref 4.0–10.5)
nRBC: 0 % (ref 0.0–0.2)

## 2022-03-15 LAB — COMPREHENSIVE METABOLIC PANEL
ALT: 33 U/L (ref 0–44)
AST: 84 U/L — ABNORMAL HIGH (ref 15–41)
Albumin: 2.1 g/dL — ABNORMAL LOW (ref 3.5–5.0)
Alkaline Phosphatase: 143 U/L — ABNORMAL HIGH (ref 38–126)
Anion gap: 8 (ref 5–15)
BUN: 23 mg/dL — ABNORMAL HIGH (ref 6–20)
CO2: 25 mmol/L (ref 22–32)
Calcium: 8.4 mg/dL — ABNORMAL LOW (ref 8.9–10.3)
Chloride: 107 mmol/L (ref 98–111)
Creatinine, Ser: 1.14 mg/dL (ref 0.61–1.24)
GFR, Estimated: >60 mL/min (ref >60)
Glucose, Bld: 102 mg/dL — ABNORMAL HIGH (ref 70–99)
Potassium: 3 mmol/L — ABNORMAL LOW (ref 3.5–5.1)
Sodium: 140 mmol/L (ref 135–145)
Total Bilirubin: 0.5 mg/dL (ref 0.3–1.2)
Total Protein: 5.5 g/dL — ABNORMAL LOW (ref 6.5–8.1)

## 2022-03-15 LAB — MAGNESIUM: Magnesium: 1.6 mg/dL — ABNORMAL LOW (ref 1.7–2.4)

## 2022-03-15 LAB — PROCALCITONIN: Procalcitonin: 50.46 ng/mL

## 2022-03-15 MED ORDER — POTASSIUM CHLORIDE IN NACL 40-0.9 MEQ/L-% IV SOLN: INTRAVENOUS | Status: DC

## 2022-03-15 MED ORDER — MAGNESIUM SULFATE 2 GM/50ML IV SOLN
2.0000 g | Freq: Once | INTRAVENOUS | Status: AC
  Administered 2022-03-15: 2 g via INTRAVENOUS
  Filled 2022-03-15: qty 50

## 2022-03-15 NOTE — Progress Notes (Signed)
   03/15/22 0950  Vitals  Temp (!) 101.6 F (38.7 C) (recheck temp was 102.6 oral)  Temp Source Oral  BP (!) 140/61  MAP (mmHg) 84  BP Location Left Arm  BP Method Automatic  Patient Position (if appropriate) Lying  Pulse Rate 82  Resp 20  MEWS COLOR  MEWS Score Color Yellow  Oxygen Therapy  SpO2 100 %  MEWS Score  MEWS Temp 2  MEWS Systolic 0  MEWS Pulse 0  MEWS RR 0  MEWS LOC 0  MEWS Score 2  Provider Notification  Provider Name/Title Dr Tat  Date Provider Notified 03/15/22  Time Provider Notified 1001  Method of Notification Page  Notification Reason Other (Comment) (temp 101.6, temp 102.6)  Date Critical Result Received 03/15/22  Time Critical Result Received 0950  Provider response No new orders  Date of Provider Response 03/15/22  Time of Provider Response 1003

## 2022-03-15 NOTE — Progress Notes (Signed)
Patient complained of a headache. Prn medications given. See MAR.

## 2022-03-15 NOTE — Progress Notes (Addendum)
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 evidence of appendicitis. No evidence of bowel wall thickening, distention, or inflammatory changes. Vascular/Lymphatic: Aortic atherosclerosis. No enlarged abdominal or pelvic lymph nodes. Reproductive: Prostate is unremarkable. Other: No abdominal wall hernia or abnormality. No abdominopelvic ascites. Musculoskeletal: No acute or significant osseous findings. IMPRESSION: 1. Hepatosplenomegaly. 2. Hepatic Fatty infiltration. 3. Left renal cysts unchanged. 4. Otherwise no acute abdominal or pelvic pathology identified Electronically Signed   By: Sammie Bench M.D.   On: 03/13/2022 13:40   DG Chest Port 1 View  Result Date: 03/13/2022 CLINICAL DATA:   Possible seps this. Patient fell attempting to take a shower. EXAM: PORTABLE CHEST 1 VIEW COMPARISON:  09/08/2015. FINDINGS: Cardiac silhouette normal in size.  No mediastinal or hilar masses. Low lung volumes with bronchovascular crowding. Allowing for this, lungs are clear. No convincing pleural effusion and no pneumothorax. Acute appearing fracture of the mid right clavicle, mildly comminuted, without significant displacement. IMPRESSION: 1. No acute cardiopulmonary disease. 2. Mildly comminuted fracture of the mid right clavicle without significant displacement. Electronically Signed   By: Lajean Manes M.D.   On: 03/13/2022 12:23   CT Head Wo Contrast  Result Date: 03/13/2022 CLINICAL DATA:  Fall.  Altered mental status. EXAM: CT HEAD WITHOUT CONTRAST TECHNIQUE: Contiguous axial images were obtained from the base of the skull through the vertex without intravenous contrast. RADIATION DOSE REDUCTION: This exam was performed according to the departmental dose-optimization program which includes automated exposure control, adjustment of the mA and/or kV according to patient size and/or use of iterative reconstruction technique.  COMPARISON:  None Available. FINDINGS: Brain: No evidence of acute infarction, hemorrhage, hydrocephalus, extra-axial collection or mass lesion/mass effect. Vascular: No hyperdense vessel or unexpected calcification. Skull: Normal. Negative for fracture or focal lesion. Sinuses/Orbits: No acute finding. Other: None. IMPRESSION: No CT evidence of intracranial injury Electronically Signed   By: Marin Roberts M.D.   On: 03/13/2022 12:21    Orson Eva, DO  [facility]  If 7PM-7AM, please contact night-coverage [email or web address] Password [email or web address] 03/15/2022, 4:17 PM   LOS: 2 days

## 2022-03-16 DIAGNOSIS — R7881 Bacteremia: Secondary | ICD-10-CM | POA: Diagnosis not present

## 2022-03-16 DIAGNOSIS — A4150 Gram-negative sepsis, unspecified: Secondary | ICD-10-CM | POA: Diagnosis not present

## 2022-03-16 DIAGNOSIS — B9620 Unspecified Escherichia coli [E. coli] as the cause of diseases classified elsewhere: Secondary | ICD-10-CM | POA: Diagnosis not present

## 2022-03-16 DIAGNOSIS — N179 Acute kidney failure, unspecified: Secondary | ICD-10-CM | POA: Diagnosis not present

## 2022-03-16 LAB — CULTURE, BLOOD (ROUTINE X 2)
Special Requests: ADEQUATE
Special Requests: ADEQUATE

## 2022-03-16 LAB — OCCULT BLOOD X 1 CARD TO LAB, STOOL: Fecal Occult Bld: POSITIVE — AB

## 2022-03-16 LAB — COMPREHENSIVE METABOLIC PANEL
ALT: 33 U/L (ref 0–44)
AST: 82 U/L — ABNORMAL HIGH (ref 15–41)
Albumin: 2.2 g/dL — ABNORMAL LOW (ref 3.5–5.0)
Alkaline Phosphatase: 173 U/L — ABNORMAL HIGH (ref 38–126)
Anion gap: 8 (ref 5–15)
BUN: 17 mg/dL (ref 6–20)
CO2: 26 mmol/L (ref 22–32)
Calcium: 8.3 mg/dL — ABNORMAL LOW (ref 8.9–10.3)
Chloride: 108 mmol/L (ref 98–111)
Creatinine, Ser: 0.95 mg/dL (ref 0.61–1.24)
GFR, Estimated: >60 mL/min (ref >60)
Glucose, Bld: 110 mg/dL — ABNORMAL HIGH (ref 70–99)
Potassium: 3.4 mmol/L — ABNORMAL LOW (ref 3.5–5.1)
Sodium: 142 mmol/L (ref 135–145)
Total Bilirubin: 0.6 mg/dL (ref 0.3–1.2)
Total Protein: 5.9 g/dL — ABNORMAL LOW (ref 6.5–8.1)

## 2022-03-16 LAB — CBC
HCT: 27.1 % — ABNORMAL LOW (ref 39.0–52.0)
Hemoglobin: 8.9 g/dL — ABNORMAL LOW (ref 13.0–17.0)
MCH: 30.4 pg (ref 26.0–34.0)
MCHC: 32.8 g/dL (ref 30.0–36.0)
MCV: 92.5 fL (ref 80.0–100.0)
Platelets: 224 10*3/uL (ref 150–400)
RBC: 2.93 MIL/uL — ABNORMAL LOW (ref 4.22–5.81)
RDW: 13.8 % (ref 11.5–15.5)
WBC: 5.8 10*3/uL (ref 4.0–10.5)
nRBC: 0 % (ref 0.0–0.2)

## 2022-03-16 LAB — MAGNESIUM: Magnesium: 2 mg/dL (ref 1.7–2.4)

## 2022-03-16 LAB — URINE CULTURE: Culture: NO GROWTH

## 2022-03-16 LAB — C DIFFICILE QUICK SCREEN W PCR REFLEX
C Diff antigen: NEGATIVE
C Diff interpretation: NOT DETECTED
C Diff toxin: NEGATIVE

## 2022-03-16 LAB — HEMOGLOBIN A1C
Hgb A1c MFr Bld: 5.5 % (ref 4.8–5.6)
Mean Plasma Glucose: 111 mg/dL

## 2022-03-16 MED ORDER — PANTOPRAZOLE SODIUM 40 MG IV SOLR
40.0000 mg | Freq: Two times a day (BID) | INTRAVENOUS | Status: DC
  Administered 2022-03-16 – 2022-03-21 (×9): 40 mg via INTRAVENOUS
  Filled 2022-03-16 (×10): qty 10

## 2022-03-16 MED ORDER — ONDANSETRON HCL 4 MG/2ML IJ SOLN
4.0000 mg | Freq: Four times a day (QID) | INTRAMUSCULAR | Status: DC
  Administered 2022-03-16 – 2022-03-17 (×4): 4 mg via INTRAVENOUS
  Filled 2022-03-16 (×4): qty 2

## 2022-03-16 MED ORDER — PANTOPRAZOLE SODIUM 40 MG IV SOLR: 40.0000 mg | Freq: Two times a day (BID) | INTRAVENOUS | Status: DC

## 2022-03-16 NOTE — Progress Notes (Addendum)
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 atelectasis or scarring and no pericardial or pleural effusion. Hepatobiliary: Liver is prominent and hypodense consistent with fatty infiltration. No focal lesions identified in the liver without contrast. Pancreas: Unremarkable. No pancreatic ductal dilatation or surrounding inflammatory changes. Spleen: Enlarged, 18 cm without focal lesions identified Adrenals/Urinary Tract: No adrenal lesions. No nephrolithiasis or hydronephrosis. Bilateral perinephric stranding consistent with previous obstruction or infection. Left-sided renal cysts measuring 4.6 cm and 5.9 cm which are stable findings and  do not require imaging follow up. Urinary bladder unremarkable. Stomach/Bowel: Stomach is within normal limits. Appendix not seen and no evidence of appendicitis. No evidence of bowel wall thickening, distention, or inflammatory changes. Vascular/Lymphatic: Aortic atherosclerosis. No enlarged abdominal or pelvic lymph nodes. Reproductive: Prostate is unremarkable. Other: No abdominal wall hernia or abnormality. No abdominopelvic ascites. Musculoskeletal: No acute or significant osseous findings. IMPRESSION: 1. Hepatosplenomegaly. 2. Hepatic Fatty infiltration. 3. Left renal cysts unchanged. 4. Otherwise no acute abdominal or pelvic pathology identified Electronically Signed   By: Sammie Bench M.D.   On:  03/13/2022 13:40   DG Chest Port 1 View  Result Date: 03/13/2022 CLINICAL DATA:  Possible seps this. Patient fell attempting to take a shower. EXAM: PORTABLE CHEST 1 VIEW COMPARISON:  09/08/2015. FINDINGS: Cardiac silhouette normal in size.  No mediastinal or hilar masses. Low lung volumes with bronchovascular crowding. Allowing for this, lungs are clear. No convincing pleural effusion and no pneumothorax. Acute appearing fracture of the mid right clavicle, mildly comminuted, without significant displacement. IMPRESSION: 1. No acute cardiopulmonary disease. 2. Mildly comminuted fracture of the mid right clavicle without significant displacement. Electronically Signed   By: Lajean Manes M.D.   On: 03/13/2022 12:23   CT Head Wo Contrast  Result Date: 03/13/2022 CLINICAL DATA:  Fall.  Altered mental status. EXAM: CT HEAD WITHOUT CONTRAST TECHNIQUE: Contiguous axial images were obtained from the base of the skull through the vertex without intravenous contrast. RADIATION DOSE REDUCTION: This exam was performed according to the departmental dose-optimization program which includes automated exposure control, adjustment of the mA and/or kV according to patient size and/or use of iterative reconstruction  technique. COMPARISON:  None Available. FINDINGS: Brain: No evidence of acute infarction, hemorrhage, hydrocephalus, extra-axial collection or mass lesion/mass effect. Vascular: No hyperdense vessel or unexpected calcification. Skull: Normal. Negative for fracture or focal lesion. Sinuses/Orbits: No acute finding. Other: None. IMPRESSION: No CT evidence of intracranial injury Electronically Signed   By: Marin Roberts M.D.   On: 03/13/2022 12:21    Orson Eva, DO  [facility]  If 7PM-7AM, please contact night-coverage [email or web address] Password [email or web address] 03/16/2022, 11:49 AM   LOS: 3 days

## 2022-03-16 NOTE — Progress Notes (Signed)
Patient has one episode of vomiting. Prn medication given. See MAR.

## 2022-03-16 NOTE — Care Management Important Message (Signed)
Important Message  Patient Details  Name: Christopher Salas MRN: [id number] Date of Birth: 1966-02-15   Medicare Important Message Given:  Other (see comment) (mailed to address on file due to precautions, no answer at [phone])     Tommy Medal 03/16/2022, 3:00 PM

## 2022-03-16 NOTE — Consult Note (Signed)
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 baseline unavailable. None seen on Care Everywhere either. Last LFTs in 08/2021 were normal. Suspect current elevation in setting of acute bacteremia. Will continue to follow, currently trending downward. He has hepatosplenomegaly on CT imaging with no prior history of liver issues.  Initially with pancytopenia but this has improved with resolution of leukopenia and thrombocytopenia.   Plan:   Await GI path results. If negative, he may require endoscopic evaluation while inpatient.  Concern for melena. Hgb stable. Await GI path results. May require EGD while inpatient depending on clinical course.  If patient does not need inpatient colonoscopy, he will need outpatient colonoscopy to evaluate positive Cologuard from 2022.  Monitor H/H closely.  PPI daily. Around the clock zofran.   LOS: 3 days   We would like to thank you for the opportunity to participate in the care of Christopher Salas.  Laureen Ochs. Bernarda Caffey [facility] [phone] 12/28/20233:51 PM

## 2022-03-16 NOTE — Progress Notes (Signed)
Patient has two type 6 stools so far during this shift. The stool appears to have some blood in it. MD notified. GI panel ordered. Stool collected and sent to the lab. Patient put on enteric precautions pending results.

## 2022-03-16 NOTE — H&P (View-Only) (Signed)
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 either. Last LFTs in 08/2021 were normal. Suspect current elevation in setting of acute bacteremia. Will continue to follow, currently trending downward. He has hepatosplenomegaly on CT imaging with no prior history of liver issues.  Initially with pancytopenia but this has improved with resolution of leukopenia and thrombocytopenia.   Plan:   Await GI path results. If negative, he may require endoscopic evaluation while inpatient.  Concern for melena. Hgb stable. Await GI path results. May require EGD while inpatient depending on clinical course.  If patient does not need inpatient colonoscopy, he will need outpatient colonoscopy to evaluate positive Cologuard from 2022.  Monitor H/H closely.  PPI daily. Around the clock zofran.   LOS: 3 days   We would like to thank you for the opportunity to participate in the care of Christopher Salas.  Laureen Ochs. Bernarda Caffey [facility] [phone] 12/28/20233:51 PM

## 2022-03-17 DIAGNOSIS — R195 Other fecal abnormalities: Secondary | ICD-10-CM | POA: Insufficient documentation

## 2022-03-17 DIAGNOSIS — N179 Acute kidney failure, unspecified: Secondary | ICD-10-CM | POA: Diagnosis not present

## 2022-03-17 DIAGNOSIS — R7881 Bacteremia: Secondary | ICD-10-CM | POA: Diagnosis not present

## 2022-03-17 DIAGNOSIS — R197 Diarrhea, unspecified: Secondary | ICD-10-CM

## 2022-03-17 DIAGNOSIS — B9620 Unspecified Escherichia coli [E. coli] as the cause of diseases classified elsewhere: Secondary | ICD-10-CM | POA: Diagnosis not present

## 2022-03-17 DIAGNOSIS — A4150 Gram-negative sepsis, unspecified: Secondary | ICD-10-CM | POA: Diagnosis not present

## 2022-03-17 DIAGNOSIS — D649 Anemia, unspecified: Secondary | ICD-10-CM

## 2022-03-17 LAB — GASTROINTESTINAL PANEL BY PCR, STOOL (REPLACES STOOL CULTURE)

## 2022-03-17 LAB — COMPREHENSIVE METABOLIC PANEL
ALT: 30 U/L (ref 0–44)
AST: 66 U/L — ABNORMAL HIGH (ref 15–41)
Albumin: 2.2 g/dL — ABNORMAL LOW (ref 3.5–5.0)
Alkaline Phosphatase: 154 U/L — ABNORMAL HIGH (ref 38–126)
Anion gap: 9 (ref 5–15)
BUN: 14 mg/dL (ref 6–20)
CO2: 22 mmol/L (ref 22–32)
Calcium: 8.3 mg/dL — ABNORMAL LOW (ref 8.9–10.3)
Chloride: 110 mmol/L (ref 98–111)
Creatinine, Ser: 0.82 mg/dL (ref 0.61–1.24)
GFR, Estimated: >60 mL/min (ref >60)
Glucose, Bld: 97 mg/dL (ref 70–99)
Potassium: 4 mmol/L (ref 3.5–5.1)
Sodium: 141 mmol/L (ref 135–145)
Total Bilirubin: 0.7 mg/dL (ref 0.3–1.2)
Total Protein: 6 g/dL — ABNORMAL LOW (ref 6.5–8.1)

## 2022-03-17 LAB — CBC
HCT: 25.9 % — ABNORMAL LOW (ref 39.0–52.0)
Hemoglobin: 8.6 g/dL — ABNORMAL LOW (ref 13.0–17.0)
MCH: 30.8 pg (ref 26.0–34.0)
MCHC: 33.2 g/dL (ref 30.0–36.0)
MCV: 92.8 fL (ref 80.0–100.0)
Platelets: 257 10*3/uL (ref 150–400)
RBC: 2.79 MIL/uL — ABNORMAL LOW (ref 4.22–5.81)
RDW: 13.7 % (ref 11.5–15.5)
WBC: 5 10*3/uL (ref 4.0–10.5)
nRBC: 0 % (ref 0.0–0.2)

## 2022-03-17 LAB — MAGNESIUM: Magnesium: 1.8 mg/dL (ref 1.7–2.4)

## 2022-03-17 MED ORDER — PROCHLORPERAZINE EDISYLATE 10 MG/2ML IJ SOLN
10.0000 mg | Freq: Four times a day (QID) | INTRAMUSCULAR | Status: DC
  Administered 2022-03-17 – 2022-03-21 (×16): 10 mg via INTRAVENOUS
  Filled 2022-03-17 (×16): qty 2
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
## 2022-03-17 NOTE — TOC Transition Note (Signed)
Transition of Care [facility]) - CM/SW Discharge Note   Patient Details  Name: Christopher Salas MRN: [id number] Date of Birth: 06-Feb-1966  [facility] CM/SW Contact:  Boneta Lucks, RN Phone Number: 03/17/2022, 1:47 PM   Clinical Narrative:   Patient discharging home. PT is recommending home health PT. Georgina Snell with [facility] accepted the referral. MD aware to order.   Final next level of care: Home w Home Health Services Barriers to Discharge: Barriers Resolved   Patient Goals and CMS Choice CMS Medicare.gov Compare Post Acute Care list provided to:: Patient    Discharge Placement      Patient and family notified of of transfer: 03/17/22  Discharge Plan and Services Additional resources added to the After Visit Summary for        [facility] Agency: [place] Date [place]: 03/17/22 Time [place]: 0093 Representative spoke with at [place]: [place] Determinants of Health ([place]) Interventions [place]: No Food Insecurity (03/13/2022)  Housing: Low Risk  (03/13/2022)  Transportation Needs: No Transportation Needs (03/13/2022)  Utilities: Not At Risk (03/13/2022)  Alcohol Screen: Low Risk  (02/15/2021)  Depression (PHQ2-9): Low Risk  (03/01/2022)  Recent Concern: Depression (PHQ2-9) - Medium Risk (12/07/2021)  Financial Resource Strain: Low Risk  (03/01/2022)  Physical Activity: Inactive (03/01/2022)  Social Connections: Moderately Isolated (03/01/2022)  Stress: No Stress Concern Present (03/01/2022)  Tobacco Use: Low Risk  (03/14/2022)   Readmission Risk Interventions    03/17/2022    1:46 PM  Readmission Risk Prevention Plan  Transportation Screening Complete  PCP or Specialist Appt within 5-7 Days Not Complete  Home Care Screening Complete  Medication Review (RN CM) Complete

## 2022-03-17 NOTE — Progress Notes (Signed)
Pt has been alert and oriented x4. Pt has complained of back pain this shift and Prn medication was administered. Pt was ambulated to the chair with one assist  this morning and pt tolerated it poorly. Pt was only able to sit up for 30 minutes.

## 2022-03-17 NOTE — Progress Notes (Addendum)
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 mmHg. IAS/Shunts: No atrial level shunt detected by color flow Doppler.  LEFT VENTRICLE PLAX 2D LVIDd:         5.70 cm   Diastology LVIDs:         4.30 cm   LV e' medial:    7.62 cm/s LV PW:         1.00 cm   LV E/e' medial:  10.8 LV IVS:        1.00 cm   LV e' lateral:   13.10 cm/s LVOT diam:     2.00 cm   LV E/e' lateral: 6.3 LV SV:         75 LV SV Index:   33 LVOT Area:     3.14 cm  RIGHT VENTRICLE RV Basal diam:  3.65 cm RV Mid diam:    3.30 cm RV S prime:     13.30 cm/s TAPSE (M-mode): 2.7 cm LEFT ATRIUM             Index        RIGHT ATRIUM           Index LA  diam:        4.30 cm 1.87 cm/m   RA Area:     18.50 cm LA Vol (A2C):   83.8 ml 36.48 ml/m  RA Volume:   57.50 ml  25.03 ml/m LA Vol (A4C):   75.1 ml 32.69 ml/m LA Biplane Vol: 79.5 ml 34.61 ml/m  AORTIC VALVE                     PULMONIC VALVE AV Area (Vmax):    2.27 cm      PV Vmax:       1.25 m/s AV Area (Vmean):   2.11 cm      PV Peak grad:  6.2 mmHg AV Area (VTI):     2.12 cm AV Vmax:           170.00 cm/s AV Vmean:          121.000 cm/s AV VTI:            0.352 m AV Peak Grad:      11.6 mmHg AV Mean Grad:      7.0 mmHg LVOT Vmax:  123.00 cm/s LVOT Vmean:        81.100 cm/s LVOT VTI:          0.238 m LVOT/AV VTI ratio: 0.68  AORTA Ao Root diam: 3.30 cm MITRAL VALVE               TRICUSPID VALVE MV Area (PHT): 4.19 cm    TR Peak grad:   22.3 mmHg MV Area VTI:   2.24 cm    TR Vmax:        236.00 cm/s MV Peak grad:  4.8 mmHg MV Mean grad:  2.0 mmHg    SHUNTS MV Vmax:       1.10 m/s    Systemic VTI:  0.24 m MV Vmean:      62.6 cm/s   Systemic Diam: 2.00 cm MV Decel Time: 181 msec MV E velocity: 82.30 cm/s MV A velocity: 82.30 cm/s MV E/A ratio:  1.00 Carlyle Dolly MD Electronically signed by Carlyle Dolly MD Signature Date/Time: 03/14/2022/3:43:11 PM    Final    CT ABDOMEN PELVIS WO CONTRAST  Result Date: 03/13/2022 CLINICAL DATA:  Impaired renal function EXAM: CT ABDOMEN AND PELVIS WITHOUT CONTRAST TECHNIQUE: Multidetector CT imaging of the abdomen and pelvis was performed following the standard protocol without IV contrast. RADIATION DOSE REDUCTION: This exam was performed according to the departmental dose-optimization program which includes automated exposure control, adjustment of the mA and/or kV according to patient size and/or use of iterative reconstruction technique. COMPARISON:  02/04/2021 FINDINGS: Lower chest: Cardiomegaly. There is dependent bibasilar subsegmental atelectasis or scarring and no pericardial or pleural effusion. Hepatobiliary: Liver is prominent and hypodense  consistent with fatty infiltration. No focal lesions identified in the liver without contrast. Pancreas: Unremarkable. No pancreatic ductal dilatation or surrounding inflammatory changes. Spleen: Enlarged, 18 cm without focal lesions identified Adrenals/Urinary Tract: No adrenal lesions. No nephrolithiasis or hydronephrosis. Bilateral perinephric stranding consistent with previous obstruction or infection. Left-sided renal cysts measuring 4.6 cm and 5.9 cm which are stable findings and do not require imaging follow up. Urinary bladder unremarkable. Stomach/Bowel: Stomach is within normal limits. Appendix not seen and no evidence of appendicitis. No evidence of bowel wall thickening, distention, or inflammatory changes. Vascular/Lymphatic: Aortic atherosclerosis. No enlarged abdominal or pelvic lymph nodes. Reproductive: Prostate is unremarkable. Other: No abdominal wall hernia or abnormality. No abdominopelvic ascites. Musculoskeletal: No acute or significant osseous findings. IMPRESSION: 1. Hepatosplenomegaly. 2. Hepatic Fatty infiltration. 3. Left renal cysts unchanged. 4. Otherwise no acute abdominal or pelvic pathology identified Electronically Signed   By: Sammie Bench M.D.   On: 03/13/2022 13:40   DG Chest Port 1 View  Result Date: 03/13/2022 CLINICAL DATA:  Possible seps this. Patient fell attempting to take a shower. EXAM: PORTABLE CHEST 1 VIEW COMPARISON:  09/08/2015. FINDINGS: Cardiac silhouette normal in size.  No mediastinal or hilar masses. Low lung volumes with bronchovascular crowding. Allowing for this, lungs are clear. No convincing pleural effusion and no pneumothorax. Acute appearing fracture of the mid right clavicle, mildly comminuted, without significant displacement. IMPRESSION: 1. No acute cardiopulmonary disease. 2. Mildly comminuted fracture of the mid right clavicle without significant displacement. Electronically Signed   By: Lajean Manes M.D.   On: 03/13/2022 12:23   CT Head  Wo Contrast  Result Date: 03/13/2022 CLINICAL DATA:  Fall.  Altered mental status. EXAM: CT HEAD WITHOUT CONTRAST TECHNIQUE: Contiguous axial images were obtained from the base of the skull through the vertex without intravenous contrast. RADIATION DOSE REDUCTION: This exam was  performed according to the departmental dose-optimization program which includes automated exposure control, adjustment of the mA and/or kV according to patient size and/or use of iterative reconstruction technique. COMPARISON:  None Available. FINDINGS: Brain: No evidence of acute infarction, hemorrhage, hydrocephalus, extra-axial collection or mass lesion/mass effect. Vascular: No hyperdense vessel or unexpected calcification. Skull: Normal. Negative for fracture or focal lesion. Sinuses/Orbits: No acute finding. Other: None. IMPRESSION: No CT evidence of intracranial injury Electronically Signed   By: Marin Roberts M.D.   On: 03/13/2022 12:21    Orson Eva, DO  [facility]  If 7PM-7AM, please contact night-coverage [email or web address] Password [email or web address] 03/17/2022, 4:40 PM   LOS: 4 days
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
## 2022-03-17 NOTE — Plan of Care (Signed)
  Problem: Acute Rehab PT Goals(only PT should resolve) Goal: Patient Will Transfer Sit To/From Stand Outcome: Progressing Flowsheets (Taken 03/17/2022 1041) Patient will transfer sit to/from stand: with modified independence Goal: Pt Will Transfer Bed To Chair/Chair To Bed Outcome: Progressing Flowsheets (Taken 03/17/2022 1041) Pt will Transfer Bed to Chair/Chair to Bed: with modified independence Goal: Pt Will Ambulate Outcome: Progressing Flowsheets (Taken 03/17/2022 1041) Pt will Ambulate:  50 feet  with min guard assist  with least restrictive assistive device Goal: Pt/caregiver will Perform Home Exercise Program Outcome: Progressing Flowsheets (Taken 03/17/2022 1041) Pt/caregiver will Perform Home Exercise Program:  For increased strengthening  For improved balance  Independently  10:41 AM, 03/17/22 Mearl Latin PT, DPT Physical Therapist at [facility]

## 2022-03-17 NOTE — Care Management Important Message (Signed)
Important Message  Patient Details  Name: Christopher Salas MRN: [id number] Date of Birth: November 20, 1965   Medicare Important Message Given:   (no answer at [phone], copy mailed to address on file 03/16/22)     Tommy Medal 03/17/2022, 1:32 PM

## 2022-03-17 NOTE — Progress Notes (Signed)
Overnight, patient c/o pain 8/10 in back r/t chronic pain, herniate discs, per patient. Gave PRN oxy, which provided some relief. Patient tolerated well, with no confusion. A&O x 4.

## 2022-03-18 DIAGNOSIS — N179 Acute kidney failure, unspecified: Secondary | ICD-10-CM | POA: Diagnosis not present

## 2022-03-18 DIAGNOSIS — R7881 Bacteremia: Secondary | ICD-10-CM | POA: Diagnosis not present

## 2022-03-18 DIAGNOSIS — R1319 Other dysphagia: Secondary | ICD-10-CM

## 2022-03-18 DIAGNOSIS — R569 Unspecified convulsions: Secondary | ICD-10-CM | POA: Diagnosis not present

## 2022-03-18 DIAGNOSIS — K921 Melena: Secondary | ICD-10-CM | POA: Diagnosis not present

## 2022-03-18 DIAGNOSIS — A4150 Gram-negative sepsis, unspecified: Secondary | ICD-10-CM | POA: Diagnosis not present

## 2022-03-18 DIAGNOSIS — R195 Other fecal abnormalities: Secondary | ICD-10-CM

## 2022-03-18 LAB — GLUCOSE, CAPILLARY: Glucose-Capillary: 97 mg/dL (ref 70–99)

## 2022-03-18 LAB — CBC
HCT: 28.9 % — ABNORMAL LOW (ref 39.0–52.0)
Hemoglobin: 9.4 g/dL — ABNORMAL LOW (ref 13.0–17.0)
MCH: 30.5 pg (ref 26.0–34.0)
MCHC: 32.5 g/dL (ref 30.0–36.0)
MCV: 93.8 fL (ref 80.0–100.0)
Platelets: 314 10*3/uL (ref 150–400)
RBC: 3.08 MIL/uL — ABNORMAL LOW (ref 4.22–5.81)
RDW: 13.3 % (ref 11.5–15.5)
WBC: 5.9 10*3/uL (ref 4.0–10.5)
nRBC: 0.3 % — ABNORMAL HIGH (ref 0.0–0.2)

## 2022-03-18 LAB — COMPREHENSIVE METABOLIC PANEL
ALT: 34 U/L (ref 0–44)
AST: 79 U/L — ABNORMAL HIGH (ref 15–41)
Albumin: 2.4 g/dL — ABNORMAL LOW (ref 3.5–5.0)
Alkaline Phosphatase: 157 U/L — ABNORMAL HIGH (ref 38–126)
Anion gap: 9 (ref 5–15)
BUN: 12 mg/dL (ref 6–20)
CO2: 22 mmol/L (ref 22–32)
Calcium: 8.5 mg/dL — ABNORMAL LOW (ref 8.9–10.3)
Chloride: 105 mmol/L (ref 98–111)
Creatinine, Ser: 0.86 mg/dL (ref 0.61–1.24)
GFR, Estimated: >60 mL/min (ref >60)
Glucose, Bld: 108 mg/dL — ABNORMAL HIGH (ref 70–99)
Potassium: 4.1 mmol/L (ref 3.5–5.1)
Sodium: 136 mmol/L (ref 135–145)
Total Bilirubin: 0.5 mg/dL (ref 0.3–1.2)
Total Protein: 6.6 g/dL (ref 6.5–8.1)

## 2022-03-18 LAB — MAGNESIUM: Magnesium: 1.7 mg/dL (ref 1.7–2.4)

## 2022-03-18 MED ORDER — MAGNESIUM SULFATE 2 GM/50ML IV SOLN
2.0000 g | Freq: Once | INTRAVENOUS | Status: AC
  Administered 2022-03-18: 2 g via INTRAVENOUS
  Filled 2022-03-18: qty 50

## 2022-03-18 MED ORDER — PEG 3350-KCL-NA BICARB-NACL 420 G PO SOLR
4000.0000 mL | Freq: Once | ORAL | Status: AC
  Administered 2022-03-18: 4000 mL via ORAL

## 2022-03-18 NOTE — Progress Notes (Signed)
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 stenosis. Aortic Valve: The aortic valve is tricuspid. Aortic valve regurgitation is not visualized. No aortic stenosis is present. Aortic valve mean gradient measures 7.0 mmHg. Aortic valve peak gradient measures 11.6 mmHg. Aortic valve area, by VTI measures 2.12  cm. Pulmonic Valve: The pulmonic valve was not well visualized. Pulmonic valve regurgitation is not visualized. No evidence of pulmonic stenosis. Aorta: The aortic root is normal in size and structure. Venous: The inferior vena cava is  normal in size with greater than 50% respiratory variability, suggesting right atrial pressure of 3 mmHg. IAS/Shunts: No atrial level shunt detected by color flow Doppler.  LEFT VENTRICLE PLAX 2D LVIDd:         5.70 cm   Diastology LVIDs:         4.30 cm   LV e' medial:    7.62 cm/s LV PW:         1.00 cm   LV E/e' medial:  10.8 LV IVS:        1.00 cm   LV e' lateral:   13.10 cm/s LVOT diam:     2.00 cm   LV E/e' lateral: 6.3 LV SV:         75 LV SV Index:   33 LVOT Area:     3.14 cm  RIGHT VENTRICLE RV Basal diam:  3.65 cm RV Mid diam:    3.30 cm RV S prime:     13.30 cm/s TAPSE (M-mode): 2.7 cm LEFT ATRIUM             Index        RIGHT ATRIUM           Index LA diam:        4.30 cm 1.87 cm/m   RA Area:     18.50 cm LA Vol (A2C):   83.8 ml 36.48 ml/m  RA Volume:   57.50 ml  25.03 ml/m LA Vol (A4C):   75.1 ml 32.69 ml/m LA Biplane Vol: 79.5 ml 34.61 ml/m  AORTIC VALVE                     PULMONIC VALVE AV Area (Vmax):    2.27 cm      PV Vmax:  1.25 m/s AV Area (Vmean):   2.11 cm      PV Peak grad:  6.2 mmHg AV Area (VTI):     2.12 cm AV Vmax:           170.00 cm/s AV Vmean:          121.000 cm/s AV VTI:            0.352 m AV Peak Grad:      11.6 mmHg AV Mean Grad:      7.0 mmHg LVOT Vmax:         123.00 cm/s LVOT Vmean:        81.100 cm/s LVOT VTI:          0.238 m LVOT/AV VTI ratio: 0.68  AORTA Ao Root diam: 3.30 cm MITRAL VALVE               TRICUSPID VALVE MV Area (PHT): 4.19 cm    TR Peak grad:   22.3 mmHg MV Area VTI:   2.24 cm    TR Vmax:        236.00 cm/s MV Peak grad:  4.8 mmHg MV Mean grad:  2.0 mmHg    SHUNTS MV Vmax:       1.10 m/s    Systemic VTI:  0.24 m MV Vmean:      62.6 cm/s   Systemic Diam: 2.00 cm MV Decel Time: 181 msec MV E velocity: 82.30 cm/s MV A velocity: 82.30 cm/s MV E/A ratio:  1.00 Carlyle Dolly MD Electronically signed by Carlyle Dolly MD Signature Date/Time: 03/14/2022/3:43:11 PM    Final    CT ABDOMEN PELVIS WO CONTRAST  Result Date: 03/13/2022 CLINICAL  DATA:  Impaired renal function EXAM: CT ABDOMEN AND PELVIS WITHOUT CONTRAST TECHNIQUE: Multidetector CT imaging of the abdomen and pelvis was performed following the standard protocol without IV contrast. RADIATION DOSE REDUCTION: This exam was performed according to the departmental dose-optimization program which includes automated exposure control, adjustment of the mA and/or kV according to patient size and/or use of iterative reconstruction technique. COMPARISON:  02/04/2021 FINDINGS: Lower chest: Cardiomegaly. There is dependent bibasilar subsegmental atelectasis or scarring and no pericardial or pleural effusion. Hepatobiliary: Liver is prominent and hypodense consistent with fatty infiltration. No focal lesions identified in the liver without contrast. Pancreas: Unremarkable. No pancreatic ductal dilatation or surrounding inflammatory changes. Spleen: Enlarged, 18 cm without focal lesions identified Adrenals/Urinary Tract: No adrenal lesions. No nephrolithiasis or hydronephrosis. Bilateral perinephric stranding consistent with previous obstruction or infection. Left-sided renal cysts measuring 4.6 cm and 5.9 cm which are stable findings and do not require imaging follow up. Urinary bladder unremarkable. Stomach/Bowel: Stomach is within normal limits. Appendix not seen and no evidence of appendicitis. No evidence of bowel wall thickening, distention, or inflammatory changes. Vascular/Lymphatic: Aortic atherosclerosis. No enlarged abdominal or pelvic lymph nodes. Reproductive: Prostate is unremarkable. Other: No abdominal wall hernia or abnormality. No abdominopelvic ascites. Musculoskeletal: No acute or significant osseous findings. IMPRESSION: 1. Hepatosplenomegaly. 2. Hepatic Fatty infiltration. 3. Left renal cysts unchanged. 4. Otherwise no acute abdominal or pelvic pathology identified Electronically Signed   By: Sammie Bench M.D.   On: 03/13/2022 13:40   DG Chest Port 1 View  Result Date:  03/13/2022 CLINICAL DATA:  Possible seps this. Patient fell attempting to take a shower. EXAM: PORTABLE CHEST 1 VIEW COMPARISON:  09/08/2015. FINDINGS: Cardiac silhouette normal in size.  No mediastinal or hilar masses. Low lung volumes with bronchovascular crowding. Allowing for this, lungs  are clear. No convincing pleural effusion and no pneumothorax. Acute appearing fracture of the mid right clavicle, mildly comminuted, without significant displacement. IMPRESSION: 1. No acute cardiopulmonary disease. 2. Mildly comminuted fracture of the mid right clavicle without significant displacement. Electronically Signed   By: Lajean Manes M.D.   On: 03/13/2022 12:23   CT Head Wo Contrast  Result Date: 03/13/2022 CLINICAL DATA:  Fall.  Altered mental status. EXAM: CT HEAD WITHOUT CONTRAST TECHNIQUE: Contiguous axial images were obtained from the base of the skull through the vertex without intravenous contrast. RADIATION DOSE REDUCTION: This exam was performed according to the departmental dose-optimization program which includes automated exposure control, adjustment of the mA and/or kV according to patient size and/or use of iterative reconstruction technique. COMPARISON:  None Available. FINDINGS: Brain: No evidence of acute infarction, hemorrhage, hydrocephalus, extra-axial collection or mass lesion/mass effect. Vascular: No hyperdense vessel or unexpected calcification. Skull: Normal. Negative for fracture or focal lesion. Sinuses/Orbits: No acute finding. Other: None. IMPRESSION: No CT evidence of intracranial injury Electronically Signed   By: Marin Roberts M.D.   On: 03/13/2022 12:21    Orson Eva, DO  [facility]  If 7PM-7AM, please contact night-coverage [email or web address] Password [email or web address] 03/18/2022, 5:18 PM   LOS: 5 days

## 2022-03-18 NOTE — Progress Notes (Signed)
Pt has refused breakfast this morning. Pt was washed up and ambulated to the chair  after a lot of encouragement. Pt is agreeable to sit up until the lunch trays arrive but states " I will not be eating lunch either".
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
## 2022-03-18 NOTE — Progress Notes (Signed)
Consent signed by pt

## 2022-03-19 ENCOUNTER — Encounter (HOSPITAL_COMMUNITY)
Admission: EM | Disposition: A | Discharge status: Home / Self Care | Payer: Self-pay | Source: Home / Self Care | Attending: Internal Medicine

## 2022-03-19 DIAGNOSIS — B9620 Unspecified Escherichia coli [E. coli] as the cause of diseases classified elsewhere: Secondary | ICD-10-CM | POA: Diagnosis not present

## 2022-03-19 DIAGNOSIS — N179 Acute kidney failure, unspecified: Secondary | ICD-10-CM | POA: Diagnosis not present

## 2022-03-19 DIAGNOSIS — A4150 Gram-negative sepsis, unspecified: Secondary | ICD-10-CM | POA: Diagnosis not present

## 2022-03-19 DIAGNOSIS — K921 Melena: Secondary | ICD-10-CM | POA: Diagnosis not present

## 2022-03-19 DIAGNOSIS — R7881 Bacteremia: Secondary | ICD-10-CM | POA: Diagnosis not present

## 2022-03-19 LAB — COMPREHENSIVE METABOLIC PANEL
ALT: 34 U/L (ref 0–44)
AST: 67 U/L — ABNORMAL HIGH (ref 15–41)
Albumin: 2.5 g/dL — ABNORMAL LOW (ref 3.5–5.0)
Alkaline Phosphatase: 159 U/L — ABNORMAL HIGH (ref 38–126)
Anion gap: 10 (ref 5–15)
BUN: 12 mg/dL (ref 6–20)
CO2: 21 mmol/L — ABNORMAL LOW (ref 22–32)
Calcium: 8.6 mg/dL — ABNORMAL LOW (ref 8.9–10.3)
Chloride: 106 mmol/L (ref 98–111)
Creatinine, Ser: 0.81 mg/dL (ref 0.61–1.24)
GFR, Estimated: >60 mL/min (ref >60)
Glucose, Bld: 93 mg/dL (ref 70–99)
Potassium: 4.1 mmol/L (ref 3.5–5.1)
Sodium: 137 mmol/L (ref 135–145)
Total Bilirubin: 1 mg/dL (ref 0.3–1.2)
Total Protein: 7.2 g/dL (ref 6.5–8.1)

## 2022-03-19 LAB — CBC
HCT: 32 % — ABNORMAL LOW (ref 39.0–52.0)
Hemoglobin: 10.3 g/dL — ABNORMAL LOW (ref 13.0–17.0)
MCH: 30.4 pg (ref 26.0–34.0)
MCHC: 32.2 g/dL (ref 30.0–36.0)
MCV: 94.4 fL (ref 80.0–100.0)
Platelets: 397 10*3/uL (ref 150–400)
RBC: 3.39 MIL/uL — ABNORMAL LOW (ref 4.22–5.81)
RDW: 13.2 % (ref 11.5–15.5)
WBC: 6.3 10*3/uL (ref 4.0–10.5)
nRBC: 0 % (ref 0.0–0.2)

## 2022-03-19 LAB — MAGNESIUM: Magnesium: 2.2 mg/dL (ref 1.7–2.4)

## 2022-03-19 SURGERY — ESOPHAGOGASTRODUODENOSCOPY (EGD) WITH PROPOFOL
Anesthesia: Monitor Anesthesia Care

## 2022-03-19 NOTE — Progress Notes (Signed)
Patient remains stable.  He took his prep for colonoscopy last evening. He appears to be adequately prepped.  We have been unable to proceed today due to lack of anesthesia care.  I was just informed by administrative nursing staff that anesthesia coverage at [facility] would not be available until January 2.  This gentleman has been on a clear liquid diet now for 2 days.  It is acknowledged that leaving him on a clear liquid another 2 days would be an undue hardship.  I discussed the scenario with him via telephone this evening.  I offered to go ahead and allow patient to eat and regroup tomorrow about a re-prep/ re-schedule EGD and colonoscopy for a later time.Marland Kitchen  However, given his chronic dysphagia symptoms, presentation consistent with an UGI bleed and positive Cologuard last year, I do feel strongly he ought to have endoscopic evaluation before he goes home without further delays.  Patient states he has no problem with a clear liquid diet.  Therefore, we will continue on a clear liquid diet and plan for him to undergo EGD/esophageal dilation and colonoscopy on January 2.  Dr. [name] will be performing these procedures in my absence.Marland Kitchen

## 2022-03-19 NOTE — Progress Notes (Signed)
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 stenosis. MV peak gradient, 4.8 mmHg. The mean mitral valve gradient is 2.0 mmHg. Tricuspid Valve: The tricuspid valve is abnormal. Tricuspid valve regurgitation is mild . No evidence of tricuspid stenosis. Aortic Valve: The aortic valve is tricuspid. Aortic valve regurgitation is not visualized. No aortic stenosis is present. Aortic valve mean gradient measures 7.0 mmHg. Aortic valve peak gradient measures 11.6 mmHg. Aortic valve area, by VTI measures 2.12  cm. Pulmonic Valve: The pulmonic valve was not well visualized.  Pulmonic valve regurgitation is not visualized. No evidence of pulmonic stenosis. Aorta: The aortic root is normal in size and structure. Venous: The inferior vena cava is normal in size with greater than 50% respiratory variability, suggesting right atrial pressure of 3 mmHg. IAS/Shunts: No atrial level shunt detected by color flow Doppler.  LEFT VENTRICLE PLAX 2D LVIDd:         5.70 cm   Diastology LVIDs:         4.30 cm   LV e' medial:    7.62 cm/s LV PW:         1.00 cm   LV E/e' medial:  10.8 LV IVS:        1.00 cm   LV e' lateral:   13.10 cm/s LVOT diam:     2.00 cm   LV E/e' lateral: 6.3 LV SV:         75 LV SV Index:   33 LVOT Area:     3.14 cm  RIGHT VENTRICLE RV Basal diam:  3.65 cm RV Mid diam:    3.30 cm RV S prime:     13.30 cm/s TAPSE (M-mode): 2.7 cm LEFT ATRIUM             Index        RIGHT ATRIUM           Index LA diam:        4.30 cm 1.87 cm/m   RA Area:     18.50 cm LA Vol (A2C):   83.8 ml 36.48 ml/m  RA Volume:  57.50 ml  25.03 ml/m LA Vol (A4C):   75.1 ml 32.69 ml/m LA Biplane Vol: 79.5 ml 34.61 ml/m  AORTIC VALVE                     PULMONIC VALVE AV Area (Vmax):    2.27 cm      PV Vmax:       1.25 m/s AV Area (Vmean):   2.11 cm      PV Peak grad:  6.2 mmHg AV Area (VTI):     2.12 cm AV Vmax:           170.00 cm/s AV Vmean:          121.000 cm/s AV VTI:            0.352 m AV Peak Grad:      11.6 mmHg AV Mean Grad:      7.0 mmHg LVOT Vmax:         123.00 cm/s LVOT Vmean:        81.100 cm/s LVOT VTI:          0.238 m LVOT/AV VTI ratio: 0.68  AORTA Ao Root diam: 3.30 cm MITRAL VALVE               TRICUSPID VALVE MV Area (PHT): 4.19 cm    TR Peak grad:   22.3 mmHg MV Area VTI:   2.24 cm    TR Vmax:        236.00 cm/s MV Peak grad:  4.8 mmHg MV Mean grad:  2.0 mmHg    SHUNTS MV Vmax:       1.10 m/s    Systemic VTI:  0.24 m MV Vmean:      62.6 cm/s   Systemic Diam: 2.00 cm MV Decel Time: 181 msec MV E velocity: 82.30 cm/s MV A velocity: 82.30 cm/s MV E/A ratio:  1.00 Carlyle Dolly MD  Electronically signed by Carlyle Dolly MD Signature Date/Time: 03/14/2022/3:43:11 PM    Final    CT ABDOMEN PELVIS WO CONTRAST  Result Date: 03/13/2022 CLINICAL DATA:  Impaired renal function EXAM: CT ABDOMEN AND PELVIS WITHOUT CONTRAST TECHNIQUE: Multidetector CT imaging of the abdomen and pelvis was performed following the standard protocol without IV contrast. RADIATION DOSE REDUCTION: This exam was performed according to the departmental dose-optimization program which includes automated exposure control, adjustment of the mA and/or kV according to patient size and/or use of iterative reconstruction technique. COMPARISON:  02/04/2021 FINDINGS: Lower chest: Cardiomegaly. There is dependent bibasilar subsegmental atelectasis or scarring and no pericardial or pleural effusion. Hepatobiliary: Liver is prominent and hypodense consistent with fatty infiltration. No focal lesions identified in the liver without contrast. Pancreas: Unremarkable. No pancreatic ductal dilatation or surrounding inflammatory changes. Spleen: Enlarged, 18 cm without focal lesions identified Adrenals/Urinary Tract: No adrenal lesions. No nephrolithiasis or hydronephrosis. Bilateral perinephric stranding consistent with previous obstruction or infection. Left-sided renal cysts measuring 4.6 cm and 5.9 cm which are stable findings and do not require imaging follow up. Urinary bladder unremarkable. Stomach/Bowel: Stomach is within normal limits. Appendix not seen and no evidence of appendicitis. No evidence of bowel wall thickening, distention, or inflammatory changes. Vascular/Lymphatic: Aortic atherosclerosis. No enlarged abdominal or pelvic lymph nodes. Reproductive: Prostate is unremarkable. Other: No abdominal wall hernia or abnormality. No abdominopelvic ascites. Musculoskeletal: No acute or significant osseous findings. IMPRESSION: 1. Hepatosplenomegaly. 2. Hepatic Fatty infiltration. 3. Left renal cysts unchanged. 4. Otherwise no  acute abdominal or pelvic pathology identified Electronically  Signed   By: Sammie Bench M.D.   On: 03/13/2022 13:40   DG Chest Port 1 View  Result Date: 03/13/2022 CLINICAL DATA:  Possible seps this. Patient fell attempting to take a shower. EXAM: PORTABLE CHEST 1 VIEW COMPARISON:  09/08/2015. FINDINGS: Cardiac silhouette normal in size.  No mediastinal or hilar masses. Low lung volumes with bronchovascular crowding. Allowing for this, lungs are clear. No convincing pleural effusion and no pneumothorax. Acute appearing fracture of the mid right clavicle, mildly comminuted, without significant displacement. IMPRESSION: 1. No acute cardiopulmonary disease. 2. Mildly comminuted fracture of the mid right clavicle without significant displacement. Electronically Signed   By: Lajean Manes M.D.   On: 03/13/2022 12:23   CT Head Wo Contrast  Result Date: 03/13/2022 CLINICAL DATA:  Fall.  Altered mental status. EXAM: CT HEAD WITHOUT CONTRAST TECHNIQUE: Contiguous axial images were obtained from the base of the skull through the vertex without intravenous contrast. RADIATION DOSE REDUCTION: This exam was performed according to the departmental dose-optimization program which includes automated exposure control, adjustment of the mA and/or kV according to patient size and/or use of iterative reconstruction technique. COMPARISON:  None Available. FINDINGS: Brain: No evidence of acute infarction, hemorrhage, hydrocephalus, extra-axial collection or mass lesion/mass effect. Vascular: No hyperdense vessel or unexpected calcification. Skull: Normal. Negative for fracture or focal lesion. Sinuses/Orbits: No acute finding. Other: None. IMPRESSION: No CT evidence of intracranial injury Electronically Signed   By: Marin Roberts M.D.   On: 03/13/2022 12:21    Orson Eva, DO  [facility]  If 7PM-7AM, please contact night-coverage [email or web address] Password [email or web address] 03/19/2022, 5:31 PM   LOS: 6 days

## 2022-03-20 DIAGNOSIS — R7881 Bacteremia: Secondary | ICD-10-CM | POA: Diagnosis not present

## 2022-03-20 DIAGNOSIS — A4150 Gram-negative sepsis, unspecified: Secondary | ICD-10-CM | POA: Diagnosis not present

## 2022-03-20 DIAGNOSIS — R195 Other fecal abnormalities: Secondary | ICD-10-CM | POA: Diagnosis not present

## 2022-03-20 DIAGNOSIS — R1319 Other dysphagia: Secondary | ICD-10-CM | POA: Diagnosis not present

## 2022-03-20 DIAGNOSIS — N179 Acute kidney failure, unspecified: Secondary | ICD-10-CM | POA: Diagnosis not present

## 2022-03-20 LAB — CULTURE, BLOOD (ROUTINE X 2)
Culture: NO GROWTH
Culture: NO GROWTH
Special Requests: ADEQUATE

## 2022-03-20 LAB — CREATININE, SERUM
Creatinine, Ser: 0.76 mg/dL (ref 0.61–1.24)
GFR, Estimated: >60 mL/min (ref >60)

## 2022-03-20 MED ORDER — BISACODYL 5 MG PO TBEC
20.0000 mg | DELAYED_RELEASE_TABLET | Freq: Once | ORAL | Status: AC
  Administered 2022-03-20: 20 mg via ORAL
  Filled 2022-03-20: qty 4

## 2022-03-20 NOTE — Care Management Important Message (Signed)
Important Message  Patient Details  Name: Christopher Salas MRN: [id number] Date of Birth: [birth year]   Medicare Important Message Given:  Other (see comment)  Attempted to reach patient via room phone [phone]) and cell [phone]) to review Medicare IM.  Unable to reach upon attempts, message left on cell encouraging callback.    Dannette Barbara 03/20/2022, 2:24 PM

## 2022-03-20 NOTE — Progress Notes (Signed)
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 tricuspid valve is abnormal. Tricuspid valve regurgitation is mild . No evidence of tricuspid stenosis. Aortic Valve: The aortic valve is tricuspid. Aortic valve regurgitation is not visualized. No aortic stenosis is present. Aortic valve mean gradient measures 7.0 mmHg. Aortic valve peak gradient measures 11.6 mmHg. Aortic valve area, by VTI measures 2.12  cm. Pulmonic Valve: The pulmonic valve was not well visualized. Pulmonic valve regurgitation is not visualized. No evidence of pulmonic stenosis. Aorta: The aortic root is normal in size and structure. Venous: The  inferior vena cava is normal in size with greater than 50% respiratory variability, suggesting right atrial pressure of 3 mmHg. IAS/Shunts: No atrial level shunt detected by color flow Doppler.  LEFT VENTRICLE PLAX 2D LVIDd:         5.70 cm   Diastology LVIDs:         4.30 cm   LV e' medial:    7.62 cm/s LV PW:         1.00 cm   LV E/e' medial:  10.8 LV IVS:        1.00 cm   LV e' lateral:   13.10 cm/s LVOT diam:     2.00 cm   LV E/e' lateral: 6.3 LV SV:         75 LV SV Index:   33 LVOT Area:     3.14 cm  RIGHT VENTRICLE RV Basal diam:  3.65 cm RV Mid diam:    3.30 cm RV S prime:     13.30 cm/s TAPSE (M-mode): 2.7 cm LEFT ATRIUM             Index        RIGHT ATRIUM           Index LA diam:        4.30 cm 1.87 cm/m   RA Area:     18.50 cm LA Vol (A2C):   83.8 ml 36.48 ml/m  RA Volume:   57.50 ml  25.03 ml/m LA Vol (A4C):   75.1 ml 32.69 ml/m LA Biplane Vol: 79.5 ml 34.61 ml/m  AORTIC VALVE  PULMONIC VALVE AV Area (Vmax):    2.27 cm      PV Vmax:       1.25 m/s AV Area (Vmean):   2.11 cm      PV Peak grad:  6.2 mmHg AV Area (VTI):     2.12 cm AV Vmax:           170.00 cm/s AV Vmean:          121.000 cm/s AV VTI:            0.352 m AV Peak Grad:      11.6 mmHg AV Mean Grad:      7.0 mmHg LVOT Vmax:         123.00 cm/s LVOT Vmean:        81.100 cm/s LVOT VTI:          0.238 m LVOT/AV VTI ratio: 0.68  AORTA Ao Root diam: 3.30 cm MITRAL VALVE               TRICUSPID VALVE MV Area (PHT): 4.19 cm    TR Peak grad:   22.3 mmHg MV Area VTI:   2.24 cm    TR Vmax:        236.00 cm/s MV Peak grad:  4.8 mmHg MV Mean grad:  2.0 mmHg    SHUNTS MV Vmax:       1.10 m/s    Systemic VTI:  0.24 m MV Vmean:      62.6 cm/s   Systemic Diam: 2.00 cm MV Decel Time: 181 msec MV E velocity: 82.30 cm/s MV A velocity: 82.30 cm/s MV E/A ratio:  1.00 Carlyle Dolly MD Electronically signed by Carlyle Dolly MD Signature Date/Time: 03/14/2022/3:43:11 PM    Final    CT ABDOMEN PELVIS WO CONTRAST  Result Date:  03/13/2022 CLINICAL DATA:  Impaired renal function EXAM: CT ABDOMEN AND PELVIS WITHOUT CONTRAST TECHNIQUE: Multidetector CT imaging of the abdomen and pelvis was performed following the standard protocol without IV contrast. RADIATION DOSE REDUCTION: This exam was performed according to the departmental dose-optimization program which includes automated exposure control, adjustment of the mA and/or kV according to patient size and/or use of iterative reconstruction technique. COMPARISON:  02/04/2021 FINDINGS: Lower chest: Cardiomegaly. There is dependent bibasilar subsegmental atelectasis or scarring and no pericardial or pleural effusion. Hepatobiliary: Liver is prominent and hypodense consistent with fatty infiltration. No focal lesions identified in the liver without contrast. Pancreas: Unremarkable. No pancreatic ductal dilatation or surrounding inflammatory changes. Spleen: Enlarged, 18 cm without focal lesions identified Adrenals/Urinary Tract: No adrenal lesions. No nephrolithiasis or hydronephrosis. Bilateral perinephric stranding consistent with previous obstruction or infection. Left-sided renal cysts measuring 4.6 cm and 5.9 cm which are stable findings and do not require imaging follow up. Urinary bladder unremarkable. Stomach/Bowel: Stomach is within normal limits. Appendix not seen and no evidence of appendicitis. No evidence of bowel wall thickening, distention, or inflammatory changes. Vascular/Lymphatic: Aortic atherosclerosis. No enlarged abdominal or pelvic lymph nodes. Reproductive: Prostate is unremarkable. Other: No abdominal wall hernia or abnormality. No abdominopelvic ascites. Musculoskeletal: No acute or significant osseous findings. IMPRESSION: 1. Hepatosplenomegaly. 2. Hepatic Fatty infiltration. 3. Left renal cysts unchanged. 4. Otherwise no acute abdominal or pelvic pathology identified Electronically Signed   By: Sammie Bench M.D.   On: 03/13/2022 13:40   DG Chest Port 1  View  Result Date: 03/13/2022 CLINICAL DATA:  Possible seps this. Patient fell attempting to take a shower. EXAM: PORTABLE CHEST 1 VIEW COMPARISON:  09/08/2015. FINDINGS: Cardiac silhouette normal in size.  No mediastinal or hilar masses. Low lung volumes with bronchovascular crowding. Allowing for this, lungs are clear. No convincing pleural effusion and no pneumothorax. Acute appearing fracture of the mid right clavicle, mildly comminuted, without significant displacement. IMPRESSION: 1. No acute cardiopulmonary disease. 2. Mildly comminuted fracture of the mid right clavicle without significant displacement. Electronically Signed   By: Lajean Manes M.D.   On: 03/13/2022 12:23   CT Head Wo Contrast  Result Date: 03/13/2022 CLINICAL DATA:  Fall.  Altered mental status. EXAM: CT HEAD WITHOUT CONTRAST TECHNIQUE: Contiguous axial images were obtained from the base of the skull through the vertex without intravenous contrast. RADIATION DOSE REDUCTION: This exam was performed according to the departmental dose-optimization program which includes automated exposure control, adjustment of the mA and/or kV according to patient size and/or use of iterative reconstruction technique. COMPARISON:  None Available. FINDINGS: Brain: No evidence of acute infarction, hemorrhage, hydrocephalus, extra-axial collection or mass lesion/mass effect. Vascular: No hyperdense vessel or unexpected calcification. Skull: Normal. Negative for fracture or focal lesion. Sinuses/Orbits: No acute finding. Other: None. IMPRESSION: No CT evidence of intracranial injury Electronically Signed   By: Marin Roberts M.D.   On: 03/13/2022 12:21    Orson Eva, DO  [facility]  If 7PM-7AM, please contact night-coverage [email or web address] Password [email or web address] 03/20/2022, 4:56 PM   LOS: 7 days
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
## 2022-03-21 ENCOUNTER — Inpatient Hospital Stay (HOSPITAL_COMMUNITY): Payer: Medicare Other | Admitting: Anesthesiology

## 2022-03-21 ENCOUNTER — Telehealth: Payer: Self-pay | Admitting: Gastroenterology

## 2022-03-21 ENCOUNTER — Encounter (HOSPITAL_COMMUNITY): Payer: Self-pay | Admitting: Family Medicine

## 2022-03-21 ENCOUNTER — Encounter (HOSPITAL_COMMUNITY)
Admission: EM | Disposition: A | Discharge status: Home / Self Care | Payer: Self-pay | Source: Home / Self Care | Attending: Internal Medicine

## 2022-03-21 DIAGNOSIS — K222 Esophageal obstruction: Secondary | ICD-10-CM

## 2022-03-21 DIAGNOSIS — R7881 Bacteremia: Secondary | ICD-10-CM | POA: Diagnosis not present

## 2022-03-21 DIAGNOSIS — N179 Acute kidney failure, unspecified: Secondary | ICD-10-CM | POA: Diagnosis not present

## 2022-03-21 DIAGNOSIS — M1990 Unspecified osteoarthritis, unspecified site: Secondary | ICD-10-CM

## 2022-03-21 DIAGNOSIS — B9620 Unspecified Escherichia coli [E. coli] as the cause of diseases classified elsewhere: Secondary | ICD-10-CM | POA: Diagnosis not present

## 2022-03-21 DIAGNOSIS — K259 Gastric ulcer, unspecified as acute or chronic, without hemorrhage or perforation: Secondary | ICD-10-CM

## 2022-03-21 DIAGNOSIS — K2970 Gastritis, unspecified, without bleeding: Secondary | ICD-10-CM

## 2022-03-21 DIAGNOSIS — I10 Essential (primary) hypertension: Secondary | ICD-10-CM

## 2022-03-21 DIAGNOSIS — A4150 Gram-negative sepsis, unspecified: Secondary | ICD-10-CM | POA: Diagnosis not present

## 2022-03-21 HISTORY — PX: ESOPHAGOGASTRODUODENOSCOPY: SHX5428

## 2022-03-21 HISTORY — PX: BIOPSY: SHX5522

## 2022-03-21 HISTORY — PX: COLONOSCOPY WITH PROPOFOL: SHX5780

## 2022-03-21 HISTORY — PX: BALLOON DILATION: SHX5330

## 2022-03-21 HISTORY — PX: ESOPHAGOGASTRODUODENOSCOPY (EGD) WITH PROPOFOL: SHX5813

## 2022-03-21 LAB — COMPREHENSIVE METABOLIC PANEL
ALT: 28 U/L (ref 0–44)
AST: 43 U/L — ABNORMAL HIGH (ref 15–41)
Albumin: 2.6 g/dL — ABNORMAL LOW (ref 3.5–5.0)
Alkaline Phosphatase: 157 U/L — ABNORMAL HIGH (ref 38–126)
Anion gap: 13 (ref 5–15)
BUN: 16 mg/dL (ref 6–20)
CO2: 19 mmol/L — ABNORMAL LOW (ref 22–32)
Calcium: 8.8 mg/dL — ABNORMAL LOW (ref 8.9–10.3)
Chloride: 105 mmol/L (ref 98–111)
Creatinine, Ser: 0.7 mg/dL (ref 0.61–1.24)
GFR, Estimated: >60 mL/min (ref >60)
Glucose, Bld: 81 mg/dL (ref 70–99)
Potassium: 3.9 mmol/L (ref 3.5–5.1)
Sodium: 137 mmol/L (ref 135–145)
Total Bilirubin: 1 mg/dL (ref 0.3–1.2)
Total Protein: 7.2 g/dL (ref 6.5–8.1)

## 2022-03-21 LAB — CBC
HCT: 31 % — ABNORMAL LOW (ref 39.0–52.0)
Hemoglobin: 10 g/dL — ABNORMAL LOW (ref 13.0–17.0)
MCH: 30.1 pg (ref 26.0–34.0)
MCHC: 32.3 g/dL (ref 30.0–36.0)
MCV: 93.4 fL (ref 80.0–100.0)
Platelets: 441 10*3/uL — ABNORMAL HIGH (ref 150–400)
RBC: 3.32 MIL/uL — ABNORMAL LOW (ref 4.22–5.81)
RDW: 12.6 % (ref 11.5–15.5)
WBC: 4.9 10*3/uL (ref 4.0–10.5)
nRBC: 0 % (ref 0.0–0.2)

## 2022-03-21 LAB — MAGNESIUM: Magnesium: 2 mg/dL (ref 1.7–2.4)

## 2022-03-21 SURGERY — COLONOSCOPY WITH PROPOFOL
Anesthesia: General

## 2022-03-21 MED ORDER — SODIUM CHLORIDE 0.9 % IV SOLN: INTRAVENOUS | Status: DC

## 2022-03-21 MED ORDER — PROPOFOL 10 MG/ML IV BOLUS
INTRAVENOUS | Status: DC | PRN
  Administered 2022-03-21: 30 mg via INTRAVENOUS
  Administered 2022-03-21: 50 mg via INTRAVENOUS
  Administered 2022-03-21 (×2): 20 mg via INTRAVENOUS
  Administered 2022-03-21: 100 mg via INTRAVENOUS

## 2022-03-21 MED ORDER — CEFDINIR 300 MG PO CAPS: 300.0000 mg | ORAL_CAPSULE | Freq: Two times a day (BID) | ORAL | 0 refills | Status: DC

## 2022-03-21 MED ORDER — CEFDINIR 300 MG PO CAPS
300.0000 mg | ORAL_CAPSULE | Freq: Two times a day (BID) | ORAL | Status: DC
  Administered 2022-03-21: 300 mg via ORAL
  Filled 2022-03-21: qty 1

## 2022-03-21 MED ORDER — LIDOCAINE HCL 1 % IJ SOLN
INTRAMUSCULAR | Status: DC | PRN
  Administered 2022-03-21: 50 mg via INTRADERMAL

## 2022-03-21 MED ORDER — PANTOPRAZOLE SODIUM 40 MG PO TBEC: 40.0000 mg | DELAYED_RELEASE_TABLET | Freq: Two times a day (BID) | ORAL | Status: DC

## 2022-03-21 MED ORDER — PANTOPRAZOLE SODIUM 40 MG PO TBEC: 40.0000 mg | DELAYED_RELEASE_TABLET | Freq: Two times a day (BID) | ORAL | 1 refills | Status: DC

## 2022-03-21 MED ORDER — LACTATED RINGERS IV SOLN: INTRAVENOUS | Status: DC | PRN

## 2022-03-21 MED ORDER — LACTATED RINGERS IV SOLN: INTRAVENOUS | Status: DC

## 2022-03-21 MED ORDER — PROPOFOL 500 MG/50ML IV EMUL
INTRAVENOUS | Status: DC | PRN
  Administered 2022-03-21: 150 ug/kg/min via INTRAVENOUS

## 2022-03-21 MED ORDER — STERILE WATER FOR IRRIGATION IR SOLN
Status: DC | PRN
  Administered 2022-03-21: 120 mL

## 2022-03-21 NOTE — Care Management Important Message (Signed)
Important Message  Patient Details  Name: ARCHIMEDES HAROLD MRN: [id number] Date of Birth: [birth year]   Medicare Important Message Given:  Other (see comment)  Attempted to reach patient via room phone [phone]) to review Medicare IM.  Unable to reach upon attempt.     Dannette Barbara 03/21/2022, 1:08 PM

## 2022-03-21 NOTE — Op Note (Addendum)
[name] Procedure Date: 03/21/2022 9:41 AM MRN: [id number] Date of Birth: 04/25/1965 Attending MD: Elon Alas. [name] , [place], [id number] CSN: [id number] Age: 57 Admit Type: Inpatient Procedure:                Colonoscopy Indications:              Melena, Acute post hemorrhagic anemia, Positive                            Cologuard test Providers:                Elon Alas. [name], DO, Caprice Kluver, Rosina Lowenstein,                            RN, Ladoris Gene Technician, Technician Referring MD:              Medicines:                See the Anesthesia note for documentation of the                            administered medications Complications:            No immediate complications. Estimated Blood Loss:     Estimated blood loss: none. Procedure:                Pre-Anesthesia Assessment:                           - The anesthesia plan was to use monitored                            anesthesia care (MAC).                           After obtaining informed consent, the colonoscope                            was passed under direct vision. Throughout the                            procedure, the patient's blood pressure, pulse, and                            oxygen saturations were monitored continuously. The                            [id number]-HQ190L ([id number]) scope was introduced through                            the anus and advanced to the the cecum, identified                            by appendiceal orifice and ileocecal valve. The                            colonoscopy was performed without difficulty. The  patient tolerated the procedure well. The quality                            of the bowel preparation was evaluated using the                            BBPS [facility] Bowel Preparation Scale) with scores                            of: Right Colon = 2 (minor amount of residual                            staining, small fragments of  stool and/or opaque                            liquid, but mucosa seen well), Transverse Colon = 2                            (minor amount of residual staining, small fragments                            of stool and/or opaque liquid, but mucosa seen                            well) and Left Colon = 2 (minor amount of residual                            staining, small fragments of stool and/or opaque                            liquid, but mucosa seen well). The total BBPS score                            equals 6. Fair. Scope In: 10:10:02 AM Scope Out: 10:24:23 AM Scope Withdrawal Time: 0 hours 11 minutes 46 seconds  Total Procedure Duration: 0 hours 14 minutes 21 seconds  Findings:      The perianal and digital rectal examinations were normal.      A moderate amount of liquid stool was found in the entire colon, making       visualization difficult. Lavage of the area was performed using copious       amounts of sterile water, resulting in clearance with fair visualization.      The exam was otherwise without abnormality. Impression:               - Stool in the entire examined colon.                           - The examination was otherwise normal.                           - No specimens collected. Moderate Sedation:      Per Anesthesia Care Recommendation:           - Resume regular  diet.                           - Continue present medications.                           - Repeat colonoscopy in 10 years for screening                            purposes. Procedure Code(s):        --- Professional ---                           [phone], Colonoscopy, flexible; diagnostic, including                            collection of specimen(s) by brushing or washing,                            when performed (separate procedure) Diagnosis Code(s):        --- Professional ---                           R19.5, Other fecal abnormalities CPT copyright 2022 American Medical Association. All rights  reserved. The codes documented in this report are preliminary and upon coder review may  be revised to meet current compliance requirements. Elon Alas. [name], DO [name], DO 03/21/2022 10:26:28 AM This report has been signed electronically. Number of Addenda: 0

## 2022-03-21 NOTE — Anesthesia Preprocedure Evaluation (Signed)
Anesthesia Evaluation  Patient identified by MRN, date of birth, ID band Patient awake    Airway Mallampati: II  TM Distance: >3 FB Neck ROM: Full    Dental no notable dental hx.    Pulmonary neg pulmonary ROS   Pulmonary exam normal breath sounds clear to auscultation       Cardiovascular hypertension, Normal cardiovascular exam+ dysrhythmias  Rhythm:Regular Rate:Normal     Neuro/Psych Left leg numb and weak  Neuromuscular disease  negative psych ROS   GI/Hepatic Neg liver ROS,,,Esophageal dysphagia   Endo/Other    Renal/GU negative Renal ROS  negative genitourinary   Musculoskeletal  (+) Arthritis , Osteoarthritis,    Abdominal   Peds negative pediatric ROS (+)  Hematology  (+) Blood dyscrasia, anemia   Anesthesia Other Findings Cancer [facility])  Prostate Per internist Note:  patient was found to have sepsis secondary to E. coli bacteremia with a urinary source.  He was started on ceftriaxone IV.  Sepsis physiology improved.  Unfortunately, the patient has continued diarrhea that was heme positive with occasional hematochezia.  In addition, the patient had intractable nausea and vomiting.  GI was consulted to assist with management.  His intolerance to p.o. intake has prolonged hospitalization.  C. difficile and stool pathogen panel were negative.   Reproductive/Obstetrics                              Anesthesia Physical Anesthesia Plan  ASA: 3  Anesthesia Plan: General   Post-op Pain Management:    Induction: Intravenous  PONV Risk Score and Plan:   Airway Management Planned: Nasal Cannula and Natural Airway  Additional Equipment:   Intra-op Plan:   Post-operative Plan: Extubation in OR  Informed Consent: I have reviewed the patients History and Physical, chart, labs and discussed the procedure including the risks, benefits and alternatives for the proposed anesthesia with the  patient or authorized representative who has indicated his/her understanding and acceptance.       Plan Discussed with: CRNA  Anesthesia Plan Comments:          Anesthesia Quick Evaluation

## 2022-03-21 NOTE — Op Note (Signed)
[facility] Patient Name: Christopher Salas Procedure Date: 03/21/2022 9:42 AM MRN: [id number] Date of Birth: 11-May-1965 Attending MD: Elon Alas. [name] , [place], [id number] CSN: [id number] Age: 57 Admit Type: Inpatient Procedure:                Upper GI endoscopy Indications:              Acute post hemorrhagic anemia, Dysphagia, Melena Providers:                Elon Alas. [name], DO, Tammy Vaught, RN, Caprice Kluver Referring MD:              Medicines:                See the Anesthesia note for documentation of the                            administered medications Complications:            No immediate complications. Estimated Blood Loss:     Estimated blood loss was minimal. Procedure:                Pre-Anesthesia Assessment:                           - The anesthesia plan was to use monitored                            anesthesia care (MAC).                           After obtaining informed consent, the endoscope was                            passed under direct vision. Throughout the                            procedure, the patient's blood pressure, pulse, and                            oxygen saturations were monitored continuously. The                            GIF-H190 ([id number]) scope was introduced through the                            mouth, and advanced to the second part of duodenum.                            The upper GI endoscopy was accomplished without                            difficulty. The patient tolerated the procedure                            well. Scope In: 10:00:06 AM Scope Out: 10:05:19 AM Total Procedure Duration: 0 hours 5 minutes 13 seconds  Findings:      Mildly severe esophagitis with  no bleeding was found in the lower third       of the esophagus.      A mild Schatzki ring was found in the distal esophagus. A TTS dilator       was passed through the scope. Dilation with a 15-16.5-18 mm balloon       dilator was performed to 18 mm. The dilation  site was examined and       showed mild mucosal disruption and moderate improvement in luminal       narrowing.      Diffuse severe inflammation characterized by erosions and erythema was       found in the entire examined stomach. Biopsies were taken with a cold       forceps for Helicobacter pylori testing.      Multiple non-bleeding cratered and linear gastric ulcers with no       stigmata of bleeding were found in the gastric body and in the gastric       antrum. The largest lesion was 8 mm in largest dimension.      The duodenal bulb, first portion of the duodenum and second portion of       the duodenum were normal. Impression:               - Mildly severe esophagitis with no bleeding.                           - Mild Schatzki ring. Dilated.                           - Gastritis. Biopsied.                           - Non-bleeding gastric ulcers with no stigmata of                            bleeding.                           - Normal duodenal bulb, first portion of the                            duodenum and second portion of the duodenum. Moderate Sedation:      Per Anesthesia Care Recommendation:           - Return patient to hospital ward for ongoing care.                           - Resume regular diet.                           - Needs twice daily PPI. Avoid NSAIDs. I will                            follow-up on biopsies. Repeat EGD in 10 to 12 weeks                            to evaluate healing. Procedure Code(s):        --- Professional ---  [phone], Esophagogastroduodenoscopy, flexible,                            transoral; with transendoscopic balloon dilation of                            esophagus (less than 30 mm diameter)                           [id number], 57, Esophagogastroduodenoscopy, flexible,                            transoral; with biopsy, single or multiple Diagnosis Code(s):        --- Professional ---                            K20.90, Esophagitis, unspecified without bleeding                           K22.2, Esophageal obstruction                           K29.70, Gastritis, unspecified, without bleeding                           K25.9, Gastric ulcer, unspecified as acute or                            chronic, without hemorrhage or perforation                           D62, Acute posthemorrhagic anemia                           R13.10, Dysphagia, unspecified                           K92.1, Melena (includes Hematochezia) CPT copyright 2022 [employer]. All rights reserved. The codes documented in this report are preliminary and upon coder review may  be revised to meet current compliance requirements. Elon Alas. [name], DO Martin [name], DO 03/21/2022 10:09:25 AM This report has been signed electronically. Number of Addenda: 0

## 2022-03-21 NOTE — Interval H&P Note (Signed)
History and Physical Interval Note:  03/21/2022 9:46 AM  Christopher Salas  has presented today for surgery, with the diagnosis of Dysphagia and positive Cologuard.  The various methods of treatment have been discussed with the patient and family. After consideration of risks, benefits and other options for treatment, the patient has consented to  Procedure(s) with comments: COLONOSCOPY WITH PROPOFOL (N/A) ESOPHAGOGASTRODUODENOSCOPY (EGD) WITH PROPOFOL (N/A) ESOPHAGOGASTRODUODENOSCOPY (EGD) (N/A) - With possible esophageal dilation as a surgical intervention.  The patient's history has been reviewed, patient examined, no change in status, stable for surgery.  I have reviewed the patient's chart and labs.  Questions were answered to the patient's satisfaction.     [name]

## 2022-03-21 NOTE — Progress Notes (Signed)
Patient slept throughout the night, no complaints of pain or discomfort at this time. Patient has been NPO since midnight, he did have sips with tylenol this morning, he is unable to tolerate tylenol suppository d/t frequent loose bowel movements.

## 2022-03-21 NOTE — Telephone Encounter (Signed)
Please schedule patient a hospital follow up in 4-6 weeks. He is a Dr. [name] patient. May be with him Magda Paganini, or myself.   Venetia Night, MSN, APRN, FNP-BC, AGACNP-BC [facility]

## 2022-03-21 NOTE — Discharge Summary (Signed)
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 DETECTED NOT DETECTED Final   Haemophilus influenzae NOT DETECTED NOT DETECTED Final   Neisseria meningitidis NOT DETECTED NOT DETECTED Final   Pseudomonas aeruginosa NOT DETECTED NOT DETECTED Final   Stenotrophomonas maltophilia NOT DETECTED NOT DETECTED Final   Candida albicans NOT DETECTED NOT DETECTED Final   Candida auris NOT DETECTED NOT DETECTED Final   Candida glabrata NOT DETECTED NOT DETECTED Final   Candida krusei NOT DETECTED NOT DETECTED Final   Candida parapsilosis NOT DETECTED NOT DETECTED Final   Candida tropicalis NOT DETECTED NOT DETECTED Final   Cryptococcus neoformans/gattii NOT DETECTED NOT DETECTED Final   CTX-M ESBL NOT DETECTED NOT DETECTED Final   Carbapenem resistance IMP NOT DETECTED NOT DETECTED Final   Carbapenem resistance KPC NOT DETECTED NOT DETECTED Final    Carbapenem resistance NDM NOT DETECTED NOT DETECTED Final   Carbapenem resist OXA 48 LIKE NOT DETECTED NOT DETECTED Final   Carbapenem resistance VIM NOT DETECTED NOT DETECTED Final    Comment: Performed at [facility], [address], [place], [place] [address]  Urine Culture     Status: None   Collection Time: 03/13/22  3:41 PM   Specimen: Urine, Clean Catch  Result Value Ref Range Status   Specimen Description   Final    URINE, CLEAN CATCH Performed at [facility], [address] [place], [place] [address]    Special Requests   Final    NONE Performed at [facility], [address] [place], [place] [address]    Culture   Final    NO GROWTH Performed at [facility], [place] [address], [place], [place] [address]    Report Status 03/14/2022 FINAL  Final  MRSA Next Gen by PCR, Nasal     Status: Abnormal   Collection Time: 03/13/22  5:29 PM   Specimen: Nasal Mucosa; Nasal Swab  Result Value Ref Range Status   MRSA by PCR Next Gen DETECTED (A) NOT DETECTED Final    Comment: RESULT CALLED TO, READ BACK BY AND VERIFIED WITH: TINAJERO E. AT 0904A ON [id number] BY THOMPSON S. (NOTE) The GeneXpert MRSA Assay (FDA approved for NASAL specimens only), is one component of a comprehensive MRSA colonization surveillance program. It is not intended to diagnose MRSA infection nor to guide or monitor treatment for MRSA infections. Test performance is not FDA approved in patients less than 49 years old. Performed at [facility], [address], [place], [place] [address]   Urine Culture     Status: None   Collection Time: 03/15/22 10:01  AM   Specimen: Urine, Clean Catch  Result Value Ref Range Status   Specimen Description   Final    URINE, CLEAN CATCH Performed at [facility], [address] [place], [place] [address]    Special Requests   Final    NONE Performed at [facility], [address] [place], [place] [address]    Culture   Final    NO GROWTH Performed at [facility], [place] [address] [place], [place] [address]    Report Status 03/16/2022 FINAL  Final  Culture, blood (Routine X 2) w Reflex to ID Panel     Status: None   Collection Time: 03/15/22 10:26 AM   Specimen: BLOOD LEFT HAND  Result Value Ref Range Status   Specimen Description BLOOD LEFT HAND  Final   Special Requests   Final    BOTTLES DRAWN AEROBIC AND ANAEROBIC Blood Culture adequate volume   Culture   Final    NO GROWTH 5 DAYS Performed at [facility], [address], [place], [place] [address]    Report Status 03/20/2022 FINAL  Final  Culture, blood (Routine X 2) w Reflex to ID Panel     Status: None   Collection Time: 03/15/22 10:26 AM   Specimen: Right Antecubital; Blood  Result Value Ref Range Status   Specimen Description RIGHT ANTECUBITAL  Final   Special Requests   Final    BOTTLES DRAWN AEROBIC AND ANAEROBIC Blood Culture results may not be optimal due to an excessive volume of blood received in culture bottles   Culture   Final    NO GROWTH 5 DAYS Performed at [facility], [address], [place], [place] [address]    Report Status 03/20/2022 FINAL  Final  Gastrointestinal Panel by PCR , Stool     Status: None   Collection Time: 03/16/22  1:00 AM   Specimen: STOOL  Result Value Ref Range Status   Campylobacter species NOT DETECTED NOT DETECTED Final   Plesimonas shigelloides NOT DETECTED NOT DETECTED Final   Salmonella species NOT DETECTED NOT DETECTED Final   Yersinia enterocolitica NOT DETECTED NOT DETECTED Final   Vibrio species NOT DETECTED NOT DETECTED Final   Vibrio cholerae NOT DETECTED NOT DETECTED Final   Enteroaggregative E coli (EAEC) NOT DETECTED NOT DETECTED Final   Enteropathogenic E coli (EPEC) NOT DETECTED NOT DETECTED Final   Enterotoxigenic E coli (ETEC) NOT DETECTED NOT DETECTED Final   Shiga like toxin producing E coli (STEC) NOT DETECTED NOT DETECTED Final   Shigella/Enteroinvasive E coli (EIEC) NOT DETECTED NOT DETECTED Final    Cryptosporidium NOT DETECTED NOT DETECTED Final   Cyclospora cayetanensis NOT DETECTED NOT DETECTED Final   Entamoeba histolytica NOT DETECTED NOT DETECTED Final   Giardia lamblia NOT DETECTED NOT DETECTED Final   Adenovirus F40/41 NOT DETECTED NOT DETECTED Final   Astrovirus NOT DETECTED NOT DETECTED Final   Norovirus GI/GII NOT DETECTED NOT DETECTED Final   Rotavirus A NOT DETECTED NOT DETECTED Final   Sapovirus (I, II, IV, and V) NOT DETECTED NOT DETECTED Final    Comment: Performed at [facility], [address] [place], [place] [address]  C Difficile Quick Screen w PCR reflex     Status: None   Collection Time: 03/16/22 12:10 PM   Specimen: STOOL  Result Value Ref Range Status   C Diff antigen NEGATIVE NEGATIVE Final   C Diff toxin NEGATIVE NEGATIVE Final   C Diff interpretation No C. difficile  detected.  Final    Comment: Performed at [facility], [address], [place], [place] [address]    Labs: CBC: Recent Labs  Lab 03/16/22 [phone] 03/17/22 0543 03/18/22 0348 03/19/22 0447 03/21/22 0417  WBC 5.8 5.0 5.9 6.3 4.9  HGB 8.9* 8.6* 9.4* 10.3* 10.0*  HCT 27.1* 25.9* 28.9* 32.0* 31.0*  MCV 92.5 92.8 93.8 94.4 93.4  PLT 224 257 314 397 342*   Basic Metabolic Panel: Recent Labs  Lab 03/16/22 0337 03/17/22 0543 03/18/22 0348 03/19/22 0447 03/20/22 0505 03/21/22 0417  NA 142 141 136 137  --  137  K 3.4* 4.0 4.1 4.1  --  3.9  CL 108 110 105 106  --  105  CO2 '26 22 22 '$ 21*  --  19*  GLUCOSE 110* 97 108* 93  --  81  BUN '17 14 12 12  '$ --  16  CREATININE 0.95 0.82 0.86 0.81 0.76 0.70  CALCIUM 8.3* 8.3* 8.5* 8.6*  --  8.8*  MG 2.0 1.8 1.7 2.2  --  2.0   Liver Function Tests: Recent Labs  Lab 03/16/22 0337 03/17/22 0543 03/18/22 0348 03/19/22 0447 03/21/22 0417  AST 82* 66* 79* 67* 43*  ALT 33 30 34 34 28  ALKPHOS 173* 154* 157* 159* 157*  BILITOT 0.6 0.7 0.5 1.0 1.0  PROT 5.9* 6.0* 6.6 7.2 7.2  ALBUMIN 2.2* 2.2* 2.4* 2.5* 2.6*   CBG: Recent Labs   Lab 03/18/22 1041  GLUCAP 97    Discharge time spent: greater than 30 minutes.  Signed: Orson Eva, MD [facility] 03/21/2022

## 2022-03-21 NOTE — Transfer of Care (Signed)
Immediate Anesthesia Transfer of Care Note  Patient: Christopher Salas  Procedure(s) Performed: COLONOSCOPY WITH PROPOFOL ESOPHAGOGASTRODUODENOSCOPY (EGD) WITH PROPOFOL ESOPHAGOGASTRODUODENOSCOPY (EGD) BIOPSY BALLOON DILATION  Patient Location: PACU  Anesthesia Type:General  Level of Consciousness: awake  Airway & Oxygen Therapy: Patient Spontanous Breathing  Post-op Assessment: Report given to RN  Post vital signs: Reviewed and stable  Last Vitals:  Vitals Value Taken Time  BP 121/78 03/21/22 1031  Temp    Pulse 95 03/21/22 1033  Resp 31 03/21/22 1033  SpO2 97 % 03/21/22 1033  Vitals shown include unvalidated device data.  Last Pain:  Vitals:   03/21/22 0955  TempSrc:   PainSc: 0-No pain      Patients Stated Pain Goal: 2 (90/38/33 3832)  Complications: No notable events documented.

## 2022-03-22 ENCOUNTER — Telehealth: Payer: Self-pay | Admitting: *Deleted

## 2022-03-22 LAB — SURGICAL PATHOLOGY

## 2022-03-22 NOTE — Anesthesia Postprocedure Evaluation (Signed)
Anesthesia Post Note  Patient: Christopher Salas  Procedure(s) Performed: COLONOSCOPY WITH PROPOFOL ESOPHAGOGASTRODUODENOSCOPY (EGD) WITH PROPOFOL ESOPHAGOGASTRODUODENOSCOPY (EGD) BIOPSY BALLOON DILATION  Patient location during evaluation: PACU Anesthesia Type: General Level of consciousness: awake and alert Pain management: pain level controlled Vital Signs Assessment: post-procedure vital signs reviewed and stable Respiratory status: spontaneous breathing, nonlabored ventilation, respiratory function stable and patient connected to nasal cannula oxygen Cardiovascular status: blood pressure returned to baseline and stable Postop Assessment: no apparent nausea or vomiting Anesthetic complications: no   No notable events documented.   Last Vitals:  Vitals:   03/21/22 1045 03/21/22 1057  BP: 139/88 (!) 148/87  Pulse:  94  Resp:    Temp:  (!) 36.4 C  SpO2:  97%    Last Pain:  Vitals:   03/21/22 1057  TempSrc: Oral  PainSc:                  [name]

## 2022-03-22 NOTE — Patient Outreach (Signed)
  Care Coordination TOC Note Transition Care Management Unsuccessful Follow-up Telephone Call  Date of discharge and from where:  [facility] on 03/21/22  Attempts:  1st Attempt  Reason for unsuccessful TCM follow-up call:  Left voice message  Discharge Instructions: Please follow up with primary care provider within 1-2 weeks. Message sent to care guides to outreach patient for scheduling.  Please repeat BMP and CBC in one week. Can repeat at PCP appt. Stop mobic, omega 3, norco Start omnicef (cefdinir), colace, protonix  Chong Sicilian, BSN, RN-BC RN Care Coordinator [place] Direct Dial: [phone] Main #: [phone]

## 2022-03-22 NOTE — Progress Notes (Signed)
  Care Coordination  Note  03/22/2022 Name: Christopher Salas MRN: [id number] DOB: 11-15-1965  Christopher Salas is a 57 y.o. year old primary care patient of Dettinger, Fransisca Kaufmann, MD. I reached out to Christopher Salas by phone today to assist with scheduling a follow up appointment. Christopher Salas verbally consented to my assistance.       Follow up plan: Hospital Follow Up appointment scheduled with Dettinger, Fransisca Kaufmann, MD on 03/27/22 at 10:30 AM.   [place]  Direct Dial: [phone]

## 2022-03-23 ENCOUNTER — Telehealth: Payer: Self-pay | Admitting: *Deleted

## 2022-03-23 ENCOUNTER — Encounter: Payer: Self-pay | Admitting: *Deleted

## 2022-03-23 NOTE — Patient Outreach (Signed)
  Care Coordination TOC Note Transition Care Management Follow-up Telephone Call Date of discharge and from where: [facility] on 03/21/22 How have you been since you were released from the hospital? "I'm just real weak since all of that happened. I'm doing better than I was it just took a lot out of me." Any questions or concerns? Yes. Concerned about cost of Detrol.Says that insurance isn't covering it and it cost $65. Message sent to Lottie Dawson, PharmD to see if she can figure out if it's non-formulary or why the cost is so high.  Items Reviewed: Did the pt receive and understand the discharge instructions provided? Yes  Medications obtained and verified? Yes  Other? No  Any new allergies since your discharge? No  Dietary orders reviewed? Yes Do you have support at home? Yes   Home Care and Equipment/Supplies: Were home health services ordered? no If so, what is the name of the agency?   Has the agency set up a time to come to the patient's home? not applicable Were any new equipment or medical supplies ordered?  No What is the name of the medical supply agency?  Were you able to get the supplies/equipment? not applicable Do you have any questions related to the use of the equipment or supplies? No  Functional Questionnaire: (I = Independent and D = Dependent) ADLs: I  Bathing/Dressing- I  Meal Prep- I  Eating- I  Maintaining continence- I  Transferring/Ambulation- I  Managing Meds- I  Follow up appointments reviewed:  PCP Hospital f/u appt confirmed? Yes  Scheduled to see [facility] DOD on 03/27/22 at 10:35 [facility] f/u appt confirmed?  NOT INDICATED   Are transportation arrangements needed? No  If their condition worsens, is the pt aware to call PCP or go to the Emergency Dept.? Yes Was the patient provided with contact information for the PCP's office or ED? Yes Was to pt encouraged to call back with questions or concerns? Yes  SDOH assessments and interventions  completed:   Yes  SDOH Interventions Today    Flowsheet Row Most Recent Value  SDOH Interventions   Housing Interventions Intervention Not Indicated  Transportation Interventions Intervention Not Indicated  Physical Activity Interventions Other (Comments)  [Too sick to increase activity right now]      Care Coordination Interventions:  PCP follow up appointment requested  Message sent to Lottie Dawson, PharmD regarding cost of Detrol Therapeutic listening utilized regarding health related concerns and anxiety Offered transportation assistance, but patient declined. He states that he can drive himself but may need help getting into the office. Advised to park and call the front office and ask for triage to bring a wheelchair out to assist him into the office.   Encounter Outcome:  Pt. Visit Completed    Chong Sicilian, BSN, RN-BC RN Care Coordinator [place] Direct Dial: [phone] Main #: [phone]

## 2022-03-27 ENCOUNTER — Ambulatory Visit (INDEPENDENT_AMBULATORY_CARE_PROVIDER_SITE_OTHER): Payer: Medicare Other | Admitting: Family Medicine

## 2022-03-27 ENCOUNTER — Encounter: Payer: Self-pay | Admitting: Family Medicine

## 2022-03-27 VITALS — BP 117/75 | HR 114 | Temp 98.0°F | Ht 72.0 in | Wt 225.0 lb

## 2022-03-27 DIAGNOSIS — K259 Gastric ulcer, unspecified as acute or chronic, without hemorrhage or perforation: Secondary | ICD-10-CM

## 2022-03-27 DIAGNOSIS — N10 Acute tubulo-interstitial nephritis: Secondary | ICD-10-CM

## 2022-03-27 DIAGNOSIS — K529 Noninfective gastroenteritis and colitis, unspecified: Secondary | ICD-10-CM | POA: Diagnosis not present

## 2022-03-27 DIAGNOSIS — R6520 Severe sepsis without septic shock: Secondary | ICD-10-CM

## 2022-03-27 DIAGNOSIS — N179 Acute kidney failure, unspecified: Secondary | ICD-10-CM | POA: Diagnosis not present

## 2022-03-27 DIAGNOSIS — A419 Sepsis, unspecified organism: Secondary | ICD-10-CM | POA: Diagnosis not present

## 2022-03-27 DIAGNOSIS — R7989 Other specified abnormal findings of blood chemistry: Secondary | ICD-10-CM

## 2022-03-27 MED ORDER — SOLIFENACIN SUCCINATE 5 MG PO TABS: 5.0000 mg | ORAL_TABLET | Freq: Every day | ORAL | 1 refills | Status: DC

## 2022-03-27 NOTE — Progress Notes (Signed)
BP 117/75   Pulse (!) 114   Temp 98 F (36.7 C)   Ht 6' (1.829 m)   Wt 225 lb (102.1 kg)   SpO2 97%   BMI 30.52 kg/m    Subjective:   Patient ID: Christopher Salas, male    DOB: 1965/12/13, 57 y.o.   MRN: [id number]  HPI: Christopher Salas is a 57 y.o. male presenting on 03/27/2022 for Hospitalization Follow-up (Acute renal failure)   HPI Transition Care Management Follow-up Telephone Call Date of discharge and from where: [facility] on 03/21/22 How have you been since you were released from the hospital? "I'm just real weak since all of that happened. I'm doing better than I was it just took a lot out of me." Any questions or concerns? Yes. Concerned about cost of Detrol.Says that insurance isn't covering it and it cost $65. Message sent to Lottie Dawson, PharmD to see if she can figure out if it's non-formulary or why the cost is so high. Patient was contacted on 03/23/2022 by Trina Ao RN  [place] office visit Patient was admitted to the hospital on 03/13/2022 and discharged on 03/21/2022 for sepsis and dehydration secondary to UTI and severe diarrhea.  He was also diagnosed with gastric ulcers during his time in the hospital.  He was treated for E. coli sepsis/bacteremia.  He says today he is feeling much better although his energy is still down but he is eating and drinking and keeping fluids down.  He says he is starting to get around some but his energy and strength is still very low.  He was started on some medicine for the gastric ulcers and has follow-up with GI as well.  Is due for rechecking some blood work today.  He is trying to stay hydrated as best as he can.  Relevant past medical, surgical, family and social history reviewed and updated as indicated. Interim medical history since our last visit reviewed. Allergies and medications reviewed and updated.  Review of Systems  Constitutional:  Positive for fatigue. Negative for chills and fever.  Eyes:  Negative for discharge.   Respiratory:  Negative for shortness of breath and wheezing.   Cardiovascular:  Negative for chest pain and leg swelling.  Gastrointestinal:  Negative for abdominal pain, blood in stool, constipation, diarrhea, nausea and vomiting.  Musculoskeletal:  Negative for back pain and gait problem.  Skin:  Negative for rash.  Neurological:  Positive for weakness. Negative for dizziness, light-headedness and headaches.  All other systems reviewed and are negative.   Per HPI unless specifically indicated above   Allergies as of 03/27/2022   No Known Allergies      Medication List        Accurate as of March 27, 2022 11:31 AM. If you have any questions, ask your nurse or doctor.          STOP taking these medications    IRON-150 PO       TAKE these medications    acetaminophen 325 MG tablet Commonly known as: TYLENOL Take 650 mg by mouth every 6 (six) hours as needed for moderate pain.   cefdinir 300 MG capsule Commonly known as: OMNICEF Take 1 capsule (300 mg total) by mouth every 12 (twelve) hours.   docusate sodium 100 MG capsule Commonly known as: COLACE Take 1 capsule (100 mg total) by mouth 2 (two) times daily.   fenofibrate 48 MG tablet Commonly known as: TRICOR TAKE 2 TABLETS (96 MG TOTAL)  BY MOUTH DAILY.   gabapentin 400 MG capsule Commonly known as: NEURONTIN Take 1 capsule (400 mg total) by mouth 3 (three) times daily.   niacin 500 MG ER tablet Commonly known as: VITAMIN B3 Take 1 tablet (500 mg total) by mouth daily with breakfast.   olmesartan 20 MG tablet Commonly known as: BENICAR Take 0.5 tablets (10 mg total) by mouth in the morning and at bedtime.   pantoprazole 40 MG tablet Commonly known as: PROTONIX Take 1 tablet (40 mg total) by mouth 2 (two) times daily.   phenobarbital 16.2 MG tablet Commonly known as: LUMINAL Take 2 tablets (32.4 mg total) by mouth at bedtime.   PHENObarbital 100 MG tablet Commonly known as: LUMINAL Take 1  tablet (100 mg total) by mouth at bedtime.   rosuvastatin 10 MG tablet Commonly known as: CRESTOR Take 1 tablet (10 mg total) by mouth daily.   solifenacin 5 MG tablet Commonly known as: VESICARE Take 1 tablet (5 mg total) by mouth daily.   tolterodine 1 MG tablet Commonly known as: DETROL Take 1 tablet (1 mg total) by mouth 2 (two) times daily. What changed:  how much to take when to take this         Objective:   BP 117/75   Pulse (!) 114   Temp 98 F (36.7 C)   Ht 6' (1.829 m)   Wt 225 lb (102.1 kg)   SpO2 97%   BMI 30.52 kg/m   Wt Readings from Last 3 Encounters:  03/27/22 225 lb (102.1 kg)  03/21/22 (P) 227 lb 1.2 oz (103 kg)  01/06/22 265 lb (120.2 kg)    Physical Exam Vitals and nursing note reviewed.  Constitutional:      General: He is not in acute distress.    Appearance: He is well-developed. He is not diaphoretic.  Eyes:     General: No scleral icterus.    Conjunctiva/sclera: Conjunctivae normal.  Neck:     Thyroid: No thyromegaly.  Cardiovascular:     Rate and Rhythm: Normal rate and regular rhythm.     Heart sounds: Normal heart sounds. No murmur heard. Pulmonary:     Effort: Pulmonary effort is normal. No respiratory distress.     Breath sounds: Normal breath sounds. No wheezing.  Abdominal:     General: Abdomen is flat. Bowel sounds are normal. There is no distension.     Tenderness: There is no abdominal tenderness. There is no guarding or rebound.     Hernia: No hernia is present.  Musculoskeletal:        General: No swelling. Normal range of motion.     Cervical back: Neck supple.  Lymphadenopathy:     Cervical: No cervical adenopathy.  Skin:    General: Skin is warm and dry.     Findings: No rash.  Neurological:     Mental Status: He is alert and oriented to person, place, and time.     Coordination: Coordination normal.  Psychiatric:        Behavior: Behavior normal.       Assessment & Plan:   Problem List Items  Addressed This Visit       Other   Sepsis ([place])   Relevant Orders   CBC with Differential/Platelet   CMP14+EGFR   Severe diarrhea - Primary   Relevant Orders   CBC with Differential/Platelet   CMP14+EGFR   Other Visit Diagnoses     Acute pyelonephritis  Relevant Orders   CBC with Differential/Platelet   CMP14+EGFR   Gastric ulcer without hemorrhage or perforation, unspecified chronicity       Relevant Orders   CBC with Differential/Platelet   CMP14+EGFR   Elevated LFTs       Relevant Orders   CBC with Differential/Platelet   CMP14+EGFR     Will check some blood work performed to recheck his kidney function and liver function from the hospital.  Based on his renal function I did discuss the possibility of doing [place] although with him being slightly dehydrated I do not want to start today but that may be something we do in the future.  He says he was given Detrol but it was too expensive to help with bladder symptoms and we will try and give him Vesicare to see if it helps.  Follow up plan: Return if symptoms worsen or fail to improve.  Counseling provided for all of the vaccine components Orders Placed This Encounter  Procedures   CBC with Differential/Platelet   [place] Davius Goudeau, MD [facility] 03/27/2022, 11:31 AM

## 2022-03-28 ENCOUNTER — Encounter: Payer: Self-pay | Admitting: Family Medicine

## 2022-03-28 ENCOUNTER — Encounter (HOSPITAL_COMMUNITY): Payer: Self-pay | Admitting: Internal Medicine

## 2022-03-28 LAB — CBC WITH DIFFERENTIAL/PLATELET
Basophils Absolute: 0 10*3/uL (ref 0.0–0.2)
Basos: 1 %
EOS (ABSOLUTE): 0 10*3/uL (ref 0.0–0.4)
Eos: 1 %
Hematocrit: 35.6 % — ABNORMAL LOW (ref 37.5–51.0)
Hemoglobin: 11.7 g/dL — ABNORMAL LOW (ref 13.0–17.7)
Immature Grans (Abs): 0 10*3/uL (ref 0.0–0.1)
Immature Granulocytes: 1 %
Lymphocytes Absolute: 0.8 10*3/uL (ref 0.7–3.1)
Lymphs: 16 %
MCH: 30.2 pg (ref 26.6–33.0)
MCHC: 32.9 g/dL (ref 31.5–35.7)
MCV: 92 fL (ref 79–97)
Monocytes Absolute: 0.3 10*3/uL (ref 0.1–0.9)
Monocytes: 7 %
Neutrophils Absolute: 3.6 10*3/uL (ref 1.4–7.0)
Neutrophils: 74 %
Platelets: 506 10*3/uL — ABNORMAL HIGH (ref 150–450)
RBC: 3.88 x10E6/uL — ABNORMAL LOW (ref 4.14–5.80)
RDW: 12.7 % (ref 11.6–15.4)
WBC: 4.8 10*3/uL (ref 3.4–10.8)

## 2022-03-28 LAB — CMP14+EGFR
ALT: 19 IU/L (ref 0–44)
AST: 32 IU/L (ref 0–40)
Albumin/Globulin Ratio: 1.3 (ref 1.2–2.2)
Albumin: 4.1 g/dL (ref 3.8–4.9)
Alkaline Phosphatase: 246 IU/L — ABNORMAL HIGH (ref 44–121)
BUN/Creatinine Ratio: 8 — ABNORMAL LOW (ref 9–20)
BUN: 8 mg/dL (ref 6–24)
Bilirubin Total: 0.3 mg/dL (ref 0.0–1.2)
CO2: 21 mmol/L (ref 20–29)
Calcium: 9.9 mg/dL (ref 8.7–10.2)
Chloride: 101 mmol/L (ref 96–106)
Creatinine, Ser: 1.06 mg/dL (ref 0.76–1.27)
Globulin, Total: 3.2 g/dL (ref 1.5–4.5)
Glucose: 117 mg/dL — ABNORMAL HIGH (ref 70–99)
Potassium: 4.2 mmol/L (ref 3.5–5.2)
Sodium: 140 mmol/L (ref 134–144)
Total Protein: 7.3 g/dL (ref 6.0–8.5)
eGFR: 81 mL/min/{1.73_m2} (ref >59)

## 2022-04-26 ENCOUNTER — Other Ambulatory Visit: Payer: Self-pay

## 2022-04-26 ENCOUNTER — Encounter: Payer: Self-pay | Admitting: Gastroenterology

## 2022-04-26 ENCOUNTER — Ambulatory Visit (INDEPENDENT_AMBULATORY_CARE_PROVIDER_SITE_OTHER): Payer: Medicare Other | Admitting: Gastroenterology

## 2022-04-26 VITALS — BP 138/89 | HR 92 | Temp 97.3°F | Ht 72.0 in | Wt 230.0 lb

## 2022-04-26 DIAGNOSIS — R7989 Other specified abnormal findings of blood chemistry: Secondary | ICD-10-CM

## 2022-04-26 DIAGNOSIS — D649 Anemia, unspecified: Secondary | ICD-10-CM | POA: Diagnosis not present

## 2022-04-26 DIAGNOSIS — K250 Acute gastric ulcer with hemorrhage: Secondary | ICD-10-CM

## 2022-04-26 DIAGNOSIS — K219 Gastro-esophageal reflux disease without esophagitis: Secondary | ICD-10-CM | POA: Insufficient documentation

## 2022-04-26 DIAGNOSIS — K2100 Gastro-esophageal reflux disease with esophagitis, without bleeding: Secondary | ICD-10-CM | POA: Diagnosis not present

## 2022-04-26 DIAGNOSIS — K259 Gastric ulcer, unspecified as acute or chronic, without hemorrhage or perforation: Secondary | ICD-10-CM | POA: Insufficient documentation

## 2022-04-26 MED ORDER — PANTOPRAZOLE SODIUM 40 MG PO TBEC: 40.0000 mg | DELAYED_RELEASE_TABLET | Freq: Two times a day (BID) | ORAL | 5 refills | Status: DC

## 2022-04-26 NOTE — Addendum Note (Signed)
Addended by: Mahala Menghini on: 04/26/2022 04:08 PM   Modules accepted: Orders

## 2022-04-26 NOTE — Patient Instructions (Signed)
Upper endoscopy around April to verify ulcer healing. We will update your labs at that time. Continue pantoprazole twice daily before breakfast and supper. Continue to avoid ibuprofen, naprosyn, and other NSAIDs.  Please call if you have any questions or concerns.
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
## 2022-04-27 DIAGNOSIS — C775 Secondary and unspecified malignant neoplasm of intrapelvic lymph nodes: Secondary | ICD-10-CM | POA: Diagnosis not present

## 2022-04-27 DIAGNOSIS — M8580 Other specified disorders of bone density and structure, unspecified site: Secondary | ICD-10-CM | POA: Diagnosis not present

## 2022-05-08 ENCOUNTER — Encounter: Payer: Self-pay | Admitting: Family Medicine

## 2022-05-08 ENCOUNTER — Ambulatory Visit (INDEPENDENT_AMBULATORY_CARE_PROVIDER_SITE_OTHER): Payer: Medicare Other | Admitting: Family Medicine

## 2022-05-08 VITALS — BP 148/87 | HR 77 | Ht 72.0 in | Wt 223.0 lb

## 2022-05-08 DIAGNOSIS — B372 Candidiasis of skin and nail: Secondary | ICD-10-CM | POA: Diagnosis not present

## 2022-05-08 DIAGNOSIS — I10 Essential (primary) hypertension: Secondary | ICD-10-CM

## 2022-05-08 DIAGNOSIS — R569 Unspecified convulsions: Secondary | ICD-10-CM | POA: Diagnosis not present

## 2022-05-08 DIAGNOSIS — E782 Mixed hyperlipidemia: Secondary | ICD-10-CM

## 2022-05-08 MED ORDER — PHENOBARBITAL 100 MG PO TABS: 100.0000 mg | ORAL_TABLET | Freq: Every day | ORAL | 0 refills | Status: DC

## 2022-05-08 MED ORDER — PHENOBARBITAL 16.2 MG PO TABS: 32.4000 mg | ORAL_TABLET | Freq: Every day | ORAL | 0 refills | Status: DC

## 2022-05-08 NOTE — Progress Notes (Signed)
BP (!) 148/87   Pulse 77   Ht 6' (1.829 m)   Wt 223 lb (101.2 kg)   SpO2 100%   BMI 30.24 kg/m    Subjective:   Patient ID: Christopher Salas, male    DOB: 30-Apr-1965, 57 y.o.   MRN: [id number]  HPI: Christopher Salas is a 57 y.o. male presenting on 05/08/2022 for Medical Management of Chronic Issues, Hyperlipidemia, and Hypertension   HPI Hypertension Patient is currently on no medicine currently, diet control, and their blood pressure today is 148/87. Patient denies any lightheadedness or dizziness. Patient denies headaches, blurred vision, chest pains, shortness of breath, or weakness. Denies any side effects from medication and is content with current medication.   Hyperlipidemia Patient is coming in for recheck of his hyperlipidemia. The patient is currently taking Crestor and fenofibrate and niacin. They deny any issues with myalgias or history of liver damage from it. They deny any focal numbness or weakness or chest pain.   Seizure disorder Current rx-phenobarbital 132 mg daily at bedtime and gabapentin 400 mg 3 times daily # meds rx-30-day supply of each Effectiveness of current meds-works well Adverse reactions form meds-none  Pill count performed-No Last drug screen -09/13/2021 ( high risk q40m moderate risk q667mlow risk yearly ) Urine drug screen today- No Was [place]  If yes were their any concerning findings? -None  No flowsheet data found. Controlled substance contract signed on: 09/13/2021  Recently saw GI and had a scope that found some gastric ulcers.  He is under treatment for those.  Patient says that more recently has been having problem with a rash between his buttocks and especially he feels like it is worse because he has incontinence and wears depends that he gets moist from his urine because of leakage because of his prostatectomy and his history.  He says he has been trying to use some over-the-counter diaper creams powders but they do not  seem to be helping it better and it just seems to get worse to where the last few days he has had some cracking and bleeding in his gluteal fold.  Relevant past medical, surgical, family and social history reviewed and updated as indicated. Interim medical history since our last visit reviewed. Allergies and medications reviewed and updated.  Review of Systems  Constitutional:  Negative for chills and fever.  Eyes:  Negative for visual disturbance.  Respiratory:  Negative for shortness of breath and wheezing.   Cardiovascular:  Negative for chest pain and leg swelling.  Musculoskeletal:  Negative for back pain and gait problem.  Skin:  Positive for rash. Negative for color change.  Neurological:  Negative for dizziness, weakness and light-headedness.  All other systems reviewed and are negative.   Per HPI unless specifically indicated above   Allergies as of 05/08/2022   No Known Allergies      Medication List        Accurate as of May 08, 2022  1:26 PM. If you have any questions, ask your nurse or doctor.          acetaminophen 325 MG tablet Commonly known as: TYLENOL Take 650 mg by mouth every 6 (six) hours as needed for moderate pain.   baclofen 10 MG tablet Commonly known as: LIORESAL Take 10 mg by mouth 3 (three) times daily.   cefdinir 300 MG capsule Commonly known as: OMNICEF Take 1 capsule (300 mg total) by mouth every 12 (twelve) hours.   docusate  sodium 100 MG capsule Commonly known as: COLACE Take 1 capsule (100 mg total) by mouth 2 (two) times daily.   fenofibrate 48 MG tablet Commonly known as: TRICOR TAKE 2 TABLETS (96 MG TOTAL) BY MOUTH DAILY.   gabapentin 400 MG capsule Commonly known as: NEURONTIN Take 1 capsule (400 mg total) by mouth 3 (three) times daily.   niacin 500 MG ER tablet Commonly known as: VITAMIN B3 Take 1 tablet (500 mg total) by mouth daily with breakfast.   olmesartan 20 MG tablet Commonly known as: BENICAR Take 0.5  tablets (10 mg total) by mouth in the morning and at bedtime.   pantoprazole 40 MG tablet Commonly known as: PROTONIX Take 1 tablet (40 mg total) by mouth 2 (two) times daily.   phenobarbital 16.2 MG tablet Commonly known as: LUMINAL Take 2 tablets (32.4 mg total) by mouth at bedtime.   PHENObarbital 100 MG tablet Commonly known as: LUMINAL Take 1 tablet (100 mg total) by mouth at bedtime.   rosuvastatin 10 MG tablet Commonly known as: CRESTOR Take 1 tablet (10 mg total) by mouth daily.   solifenacin 5 MG tablet Commonly known as: VESICARE Take 1 tablet (5 mg total) by mouth daily.   tolterodine 1 MG tablet Commonly known as: DETROL Take 1 tablet (1 mg total) by mouth 2 (two) times daily. What changed:  how much to take when to take this         Objective:   BP (!) 148/87   Pulse 77   Ht 6' (1.829 m)   Wt 223 lb (101.2 kg)   SpO2 100%   BMI 30.24 kg/m   Wt Readings from Last 3 Encounters:  05/08/22 223 lb (101.2 kg)  04/26/22 230 lb (104.3 kg)  03/27/22 225 lb (102.1 kg)    Physical Exam Vitals and nursing note reviewed.  Constitutional:      General: He is not in acute distress.    Appearance: He is well-developed. He is not diaphoretic.  Eyes:     General: No scleral icterus.    Conjunctiva/sclera: Conjunctivae normal.  Neck:     Thyroid: No thyromegaly.  Cardiovascular:     Rate and Rhythm: Normal rate and regular rhythm.     Heart sounds: Normal heart sounds. No murmur heard. Pulmonary:     Effort: Pulmonary effort is normal. No respiratory distress.     Breath sounds: Normal breath sounds. No wheezing.  Skin:    General: Skin is warm and dry.     Findings: No rash.  Neurological:     Mental Status: He is alert and oriented to person, place, and time.     Coordination: Coordination normal.  Psychiatric:        Behavior: Behavior normal.       Assessment & Plan:   Problem List Items Addressed This Visit       Cardiovascular and  Mediastinum   Hypertension - Primary   Relevant Orders   CBC with Differential/Platelet   CMP14+EGFR   Lipid panel     Other   Hyperlipidemia   Relevant Orders   CBC with Differential/Platelet   CMP14+EGFR   Lipid panel   Seizures (HCC)   Relevant Medications   phenobarbital (LUMINAL) 16.2 MG tablet   PHENObarbital (LUMINAL) 100 MG tablet   Other Visit Diagnoses     Yeast dermatitis       Between gluteal folds       Continue current medicine.  Blood pressure slightly  elevated, will monitor closely at home in the near future. Follow up plan: Return in about 3 months (around 08/06/2022), or if symptoms worsen or fail to improve, for Hypertension hyperlipidemia and seizure.  Counseling provided for all of the vaccine components Orders Placed This Encounter  Procedures   CBC with Differential/Platelet   CMP14+EGFR   Lipid panel    Caryl Pina, MD [facility] 05/08/2022, 1:26 PM

## 2022-05-09 LAB — CBC WITH DIFFERENTIAL/PLATELET
Basophils Absolute: 0 10*3/uL (ref 0.0–0.2)
Basos: 0 %
EOS (ABSOLUTE): 0.2 10*3/uL (ref 0.0–0.4)
Eos: 4 %
Hematocrit: 39.5 % (ref 37.5–51.0)
Hemoglobin: 13.7 g/dL (ref 13.0–17.7)
Immature Grans (Abs): 0 10*3/uL (ref 0.0–0.1)
Immature Granulocytes: 0 %
Lymphocytes Absolute: 1.1 10*3/uL (ref 0.7–3.1)
Lymphs: 26 %
MCH: 30.3 pg (ref 26.6–33.0)
MCHC: 34.7 g/dL (ref 31.5–35.7)
MCV: 87 fL (ref 79–97)
Monocytes Absolute: 0.2 10*3/uL (ref 0.1–0.9)
Monocytes: 5 %
Neutrophils Absolute: 2.6 10*3/uL (ref 1.4–7.0)
Neutrophils: 65 %
Platelets: 254 10*3/uL (ref 150–450)
RBC: 4.52 x10E6/uL (ref 4.14–5.80)
RDW: 14.2 % (ref 11.6–15.4)
WBC: 4.1 10*3/uL (ref 3.4–10.8)

## 2022-05-09 LAB — CMP14+EGFR
ALT: 13 IU/L (ref 0–44)
AST: 21 IU/L (ref 0–40)
Albumin/Globulin Ratio: 1.7 (ref 1.2–2.2)
Albumin: 4.5 g/dL (ref 3.8–4.9)
Alkaline Phosphatase: 176 IU/L — ABNORMAL HIGH (ref 44–121)
BUN/Creatinine Ratio: 13 (ref 9–20)
BUN: 12 mg/dL (ref 6–24)
Bilirubin Total: <0.2 mg/dL (ref 0.0–1.2)
CO2: 21 mmol/L (ref 20–29)
Calcium: 9.6 mg/dL (ref 8.7–10.2)
Chloride: 106 mmol/L (ref 96–106)
Creatinine, Ser: 0.94 mg/dL (ref 0.76–1.27)
Globulin, Total: 2.6 g/dL (ref 1.5–4.5)
Glucose: 104 mg/dL — ABNORMAL HIGH (ref 70–99)
Potassium: 4.3 mmol/L (ref 3.5–5.2)
Sodium: 144 mmol/L (ref 134–144)
Total Protein: 7.1 g/dL (ref 6.0–8.5)
eGFR: 94 mL/min/{1.73_m2} (ref >59)

## 2022-05-09 LAB — LIPID PANEL
Chol/HDL Ratio: 7.7 ratio — ABNORMAL HIGH (ref 0.0–5.0)
Cholesterol, Total: 316 mg/dL — ABNORMAL HIGH (ref 100–199)
HDL: 41 mg/dL (ref >39)
LDL Chol Calc (NIH): 172 mg/dL — ABNORMAL HIGH (ref 0–99)
Triglycerides: 516 mg/dL — ABNORMAL HIGH (ref 0–149)
VLDL Cholesterol Cal: 103 mg/dL — ABNORMAL HIGH (ref 5–40)

## 2022-05-16 ENCOUNTER — Telehealth: Payer: Self-pay | Admitting: Family Medicine

## 2022-05-16 NOTE — Telephone Encounter (Signed)
Patient called regarding his lab results. Says the reason his cholesterol and triglycerides were probably elevated is because he was recently in the hospital and found out that while he was there, he wasn't being given any of his cholesterol or triglyceride medicine.

## 2022-05-17 NOTE — Telephone Encounter (Signed)
Lmtcb.

## 2022-05-17 NOTE — Telephone Encounter (Signed)
Okay we can monitor and he can follow his diet and we will see where he is at the next visit in the future.

## 2022-05-18 ENCOUNTER — Telehealth: Payer: Self-pay | Admitting: Family Medicine

## 2022-05-18 NOTE — Telephone Encounter (Signed)
2 of the liver numbers including the AST and ALT which are the most important ones were completely normal and the only 1 that was up slightly is alkaline phosphatase, it was up more than last time but it is back down to the range where he normally runs at 170.  I have no concerns about them starting this medicine.  I would just have them recheck the liver function soon after starting the medicine or we can check it.  I think he is okay to start the medicine

## 2022-05-18 NOTE — Telephone Encounter (Signed)
Left message making pt aware and to call back if needed.

## 2022-05-22 ENCOUNTER — Telehealth: Payer: Self-pay | Admitting: *Deleted

## 2022-05-22 NOTE — Telephone Encounter (Signed)
Pt calling to check to see if he needed to be scheduled for EGD. Advised that provider wanted him to have it done in April according to her note. Once we have providers schedule, we will call him to schedule. Verbalized understanding.

## 2022-05-22 NOTE — Telephone Encounter (Signed)
Pt called and left vm wanting a return call.  [place]

## 2022-05-23 NOTE — Telephone Encounter (Signed)
Pt made aware of this in his mychart.

## 2022-05-26 ENCOUNTER — Telehealth (INDEPENDENT_AMBULATORY_CARE_PROVIDER_SITE_OTHER): Payer: Self-pay | Admitting: *Deleted

## 2022-05-26 NOTE — Telephone Encounter (Signed)
Spoke with pt. He has been scheduled for EGD with Dr. [name], ASA 3 on 06/26/22 at 930am. Aware will send egd instructions. Will call back with pre-op appt.   Notification or Prior Authorization is not required for the requested services Decision ID #: [id number]

## 2022-05-29 ENCOUNTER — Telehealth: Payer: Self-pay | Admitting: *Deleted

## 2022-05-29 NOTE — Telephone Encounter (Signed)
[place]  Needs to reschedule EGD, ASA 3. w/Dr.Carver

## 2022-05-29 NOTE — Telephone Encounter (Signed)
Called pt and he is aware of his pre-op appt details

## 2022-06-02 NOTE — Telephone Encounter (Signed)
LMTRC

## 2022-06-05 ENCOUNTER — Encounter: Payer: Self-pay | Admitting: *Deleted

## 2022-06-07 DIAGNOSIS — Z5111 Encounter for antineoplastic chemotherapy: Secondary | ICD-10-CM | POA: Diagnosis not present

## 2022-06-11 ENCOUNTER — Other Ambulatory Visit: Payer: Self-pay | Admitting: Family Medicine

## 2022-06-11 DIAGNOSIS — R569 Unspecified convulsions: Secondary | ICD-10-CM

## 2022-06-12 NOTE — Telephone Encounter (Signed)
Redenied w/ comment to [facility] 'RFd 05/08/22 for 90-d supply verified last RF by [facility] 02/22/22' If it had been refill on 05/08/22 pt might have run out right before his 08/10/22 visit.

## 2022-06-12 NOTE — Addendum Note (Signed)
Addended by: Antonietta Barcelona D on: 06/12/2022 10:53 AM   Modules accepted: Orders

## 2022-06-12 NOTE — Telephone Encounter (Signed)
I do not understand why this request has been sent, he was sent a prescription on the February visit and that is a 90-day supply that he was given so that should last him through May?  Again it looks like we normally do when we send him a 90-day supply every time he is seen.  Please let me know if something else has occurred but the February prescription was for 90 days

## 2022-06-22 ENCOUNTER — Other Ambulatory Visit (HOSPITAL_COMMUNITY): Payer: Medicare Other

## 2022-07-07 ENCOUNTER — Encounter (HOSPITAL_COMMUNITY): Payer: Self-pay

## 2022-07-10 ENCOUNTER — Encounter (HOSPITAL_COMMUNITY)
Admission: RE | Admit: 2022-07-10 | Discharge: 2022-07-10 | Disposition: A | Discharge status: Home / Self Care | Payer: Medicare Other | Source: Ambulatory Visit | Attending: Internal Medicine | Admitting: Internal Medicine
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
## 2022-07-12 ENCOUNTER — Other Ambulatory Visit: Payer: Self-pay | Admitting: Family Medicine

## 2022-07-12 ENCOUNTER — Ambulatory Visit: Payer: Medicare Other | Admitting: Cardiology

## 2022-07-12 ENCOUNTER — Encounter: Payer: Self-pay | Admitting: Cardiology

## 2022-07-12 VITALS — BP 126/81 | HR 82 | Ht 72.0 in | Wt 254.0 lb

## 2022-07-12 DIAGNOSIS — I429 Cardiomyopathy, unspecified: Secondary | ICD-10-CM | POA: Diagnosis not present

## 2022-07-12 DIAGNOSIS — I447 Left bundle-branch block, unspecified: Secondary | ICD-10-CM | POA: Diagnosis not present

## 2022-07-12 DIAGNOSIS — I10 Essential (primary) hypertension: Secondary | ICD-10-CM | POA: Diagnosis not present

## 2022-07-12 MED ORDER — OLMESARTAN MEDOXOMIL 40 MG PO TABS: 40.0000 mg | ORAL_TABLET | Freq: Every day | ORAL | 3 refills | Status: DC

## 2022-07-12 NOTE — Patient Instructions (Signed)
Medication Instructions:  Please increase your Olmesartan to 40 mg once a day. Continue all other medications as listed.  *If you need a refill on your cardiac medications before your next appointment, please call your pharmacy*  Follow-Up: At [facility], you and your health needs are our priority.  As part of our continuing mission to provide you with exceptional heart care, we have created designated Provider Care Teams.  These Care Teams include your primary Cardiologist (physician) and Advanced Practice Providers (APPs -  Physician Assistants and Nurse Practitioners) who all work together to provide you with the care you need, when you need it.  We recommend signing up for the patient portal called "MyChart".  Sign up information is provided on this After Visit Summary.  MyChart is used to connect with patients for Virtual Visits (Telemedicine).  Patients are able to view lab/test results, encounter notes, upcoming appointments, etc.  Non-urgent messages can be sent to your provider as well.   To learn more about what you can do with MyChart, go to [email or web address].    Your next appointment:   1 year(s)  Provider:   Rollene Rotunda, MD

## 2022-07-13 ENCOUNTER — Ambulatory Visit (HOSPITAL_COMMUNITY)
Admission: RE | Admit: 2022-07-13 | Discharge: 2022-07-13 | Disposition: A | Discharge status: Home / Self Care | Payer: Medicare Other | Attending: Internal Medicine | Admitting: Internal Medicine

## 2022-07-13 ENCOUNTER — Ambulatory Visit (HOSPITAL_COMMUNITY): Payer: Medicare Other | Admitting: Anesthesiology

## 2022-07-13 ENCOUNTER — Ambulatory Visit (HOSPITAL_BASED_OUTPATIENT_CLINIC_OR_DEPARTMENT_OTHER): Payer: Medicare Other | Admitting: Anesthesiology

## 2022-07-13 ENCOUNTER — Encounter (HOSPITAL_COMMUNITY)
Admission: RE | Disposition: A | Discharge status: Home / Self Care | Payer: Self-pay | Source: Home / Self Care | Attending: Internal Medicine

## 2022-07-13 ENCOUNTER — Encounter (HOSPITAL_COMMUNITY): Payer: Self-pay

## 2022-07-13 DIAGNOSIS — K2970 Gastritis, unspecified, without bleeding: Secondary | ICD-10-CM

## 2022-07-13 DIAGNOSIS — R569 Unspecified convulsions: Secondary | ICD-10-CM | POA: Diagnosis not present

## 2022-07-13 DIAGNOSIS — Z8546 Personal history of malignant neoplasm of prostate: Secondary | ICD-10-CM | POA: Insufficient documentation

## 2022-07-13 DIAGNOSIS — I10 Essential (primary) hypertension: Secondary | ICD-10-CM | POA: Diagnosis not present

## 2022-07-13 DIAGNOSIS — Z79899 Other long term (current) drug therapy: Secondary | ICD-10-CM | POA: Insufficient documentation

## 2022-07-13 DIAGNOSIS — Z08 Encounter for follow-up examination after completed treatment for malignant neoplasm: Secondary | ICD-10-CM | POA: Insufficient documentation

## 2022-07-13 DIAGNOSIS — K279 Peptic ulcer, site unspecified, unspecified as acute or chronic, without hemorrhage or perforation: Secondary | ICD-10-CM | POA: Diagnosis not present

## 2022-07-13 DIAGNOSIS — K259 Gastric ulcer, unspecified as acute or chronic, without hemorrhage or perforation: Secondary | ICD-10-CM

## 2022-07-13 DIAGNOSIS — G709 Myoneural disorder, unspecified: Secondary | ICD-10-CM | POA: Diagnosis not present

## 2022-07-13 HISTORY — PX: ESOPHAGOGASTRODUODENOSCOPY (EGD) WITH PROPOFOL: SHX5813

## 2022-07-13 SURGERY — ESOPHAGOGASTRODUODENOSCOPY (EGD) WITH PROPOFOL
Anesthesia: General

## 2022-07-13 MED ORDER — PROPOFOL 10 MG/ML IV BOLUS
INTRAVENOUS | Status: DC | PRN
  Administered 2022-07-13: 100 mg via INTRAVENOUS
  Administered 2022-07-13: 40 mg via INTRAVENOUS

## 2022-07-13 MED ORDER — LACTATED RINGERS IV SOLN: INTRAVENOUS | Status: DC

## 2022-07-13 MED ORDER — LIDOCAINE HCL (CARDIAC) PF 100 MG/5ML IV SOSY
PREFILLED_SYRINGE | INTRAVENOUS | Status: DC | PRN
  Administered 2022-07-13: 50 mg via INTRAVENOUS

## 2022-07-13 NOTE — H&P (Signed)
Primary Care Physician:  Dettinger, Elige Radon, MD Primary Gastroenterologist:  Dr. Marletta Lor  Pre-Procedure History & Physical: HPI:  Christopher Salas is a 57 y.o. male is here for an EGD to be performed for history of gastric ulcers  Past Medical History:  Diagnosis Date   Allergy    seasonal   Arthritis    hands   Cancer    prostate   Hyperlipidemia    Hypertension    Insomnia    Neuromuscular disorder    Left leg numb and weak   Seizures    last one 40 years ago    Past Surgical History:  Procedure Laterality Date   BACK SURGERY  2020   L4-L6   BALLOON DILATION  03/21/2022   Procedure: BALLOON DILATION;  Surgeon: Lanelle Bal, DO;  Location: [facility];  Service: Endoscopy;;   BIOPSY  03/21/2022   Procedure: BIOPSY;  Surgeon: Lanelle Bal, DO;  [facility]: [facility];  Service: Endoscopy;;   COLONOSCOPY WITH PROPOFOL N/A 03/21/2022   Procedure: COLONOSCOPY WITH PROPOFOL;  Surgeon: Lanelle Bal, DO;  [facility]: [facility];  Service: Endoscopy;  Laterality: N/A;   ESOPHAGOGASTRODUODENOSCOPY N/A 03/21/2022   Procedure: ESOPHAGOGASTRODUODENOSCOPY (EGD);  Surgeon: Lanelle Bal, DO;  Location: [facility];  Service: Endoscopy;  Laterality: N/A;  With possible esophageal dilation   ESOPHAGOGASTRODUODENOSCOPY (EGD) WITH PROPOFOL N/A 03/21/2022   Procedure: ESOPHAGOGASTRODUODENOSCOPY (EGD) WITH PROPOFOL;  Surgeon: Lanelle Bal, DO;  Location: [facility];  Service: Endoscopy;  Laterality: N/A;   HAND SURGERY Right    LAMINECTOMY AND MICRODISCECTOMY LUMBAR SPINE  2021   LYMPH NODE DISSECTION Bilateral 05/18/2021   Procedure: LYMPH NODE DISSECTION;  Surgeon: Sebastian Ache, MD;  Location: [facility];  Service: Urology;  Laterality: Bilateral;   ROBOT ASSISTED LAPAROSCOPIC RADICAL PROSTATECTOMY N/A 05/18/2021   Procedure: XI ROBOTIC ASSISTED LAPAROSCOPIC RADICAL PROSTATECTOMY WITH INDOCYANINE GREEN DYE INJECTION;  Surgeon: Sebastian Ache, MD;  Location: [facility];  Service:  Urology;  Laterality: N/A;  3 HRS    Prior to Admission medications   Medication Sig Start Date End Date Taking? Authorizing Provider  acetaminophen (TYLENOL) 500 MG tablet Take 500 mg by mouth 3 (three) times daily.   Yes [provider]  baclofen (LIORESAL) 10 MG tablet Take 10 mg by mouth 3 (three) times daily. 04/10/22  Yes [provider]  cyclobenzaprine (FLEXERIL) 10 MG tablet Take 10 mg by mouth every 8 (eight) hours as needed for muscle spasms.   Yes [provider]  darolutamide (NUBEQA) 300 MG tablet Take 600 mg by mouth 2 (two) times daily with a meal.   Yes [provider]  docusate sodium (COLACE) 100 MG capsule Take 1 capsule (100 mg total) by mouth 2 (two) times daily. Patient taking differently: Take 100 mg by mouth daily as needed for moderate constipation or mild constipation. 05/18/21  Yes Dancy, Amanda, PA-C  fenofibrate (TRICOR) 48 MG tablet TAKE 2 TABLETS (96 MG TOTAL) BY MOUTH DAILY. Patient taking differently: Take 48 mg by mouth 2 (two) times daily. 10/03/21  Yes Dettinger, Elige Radon, MD  gabapentin (NEURONTIN) 400 MG capsule Take 1 capsule (400 mg total) by mouth 3 (three) times daily. 09/05/21  Yes Dettinger, Elige Radon, MD  iron polysaccharides (NIFEREX) 150 MG capsule Take 150 mg by mouth daily.   Yes [provider]  niacin (NIASPAN) 500 MG CR tablet Take 1 tablet (500 mg total) by mouth daily with breakfast. 09/05/21  Yes Dettinger, Elige Radon, MD  olmesartan (BENICAR) 40 MG tablet Take 1 tablet (40 mg total) by mouth daily. 07/12/22  Yes Rollene Rotunda, MD  omega-3 acid ethyl esters (LOVAZA) 1 g capsule Take 2 g by mouth 2 (two) times daily.   Yes [provider]  pantoprazole (PROTONIX) 40 MG tablet Take 1 tablet (40 mg total) by mouth 2 (two) times daily. 04/26/22  Yes Tiffany Kocher, PA-C  PHENObarbital (LUMINAL) 100 MG tablet Take 1 tablet (100 mg total) by mouth at bedtime. 05/08/22  Yes Dettinger, Elige Radon, MD   phenobarbital (LUMINAL) 16.2 MG tablet Take 2 tablets (32.4 mg total) by mouth at bedtime. 05/08/22  Yes Dettinger, Elige Radon, MD  rosuvastatin (CRESTOR) 10 MG tablet Take 1 tablet (10 mg total) by mouth daily. 09/05/21  Yes Dettinger, Elige Radon, MD  solifenacin (VESICARE) 5 MG tablet TAKE 1 TABLET (5 MG TOTAL) BY MOUTH DAILY. 07/12/22  Yes Dettinger, Elige Radon, MD  tolterodine (DETROL LA) 4 MG 24 hr capsule Take 4 mg by mouth at bedtime. 06/19/22  Yes [provider]    Allergies as of 05/26/2022   (No Known Allergies)    Family History  Problem Relation Age of Onset   Emphysema Mother    Prostate cancer Father    Diabetes Father    CAD Father 55       CABG X 5 (surgery twice)   Colon polyps Brother    Heart disease Maternal Grandfather    Heart disease Paternal Grandfather    Colon cancer Neg Hx    Crohn's disease Neg Hx    Esophageal cancer Neg Hx    Rectal cancer Neg Hx    Stomach cancer Neg Hx    Ulcerative colitis Neg Hx     Social History   Socioeconomic History   Marital status: Divorced    Spouse name: Not on file   Number of children: 1   Years of education: Not on file   Highest education level: Not on file  Occupational History   Occupation: disability  Tobacco Use   Smoking status: Never    Passive exposure: Never   Smokeless tobacco: Never  Vaping Use   Vaping Use: Never used  Substance and Sexual Activity   Alcohol use: Never   Drug use: Never   Sexual activity: Yes  Other Topics Concern   Not on file  Social History Narrative   Not on file   Social Determinants of Health   Financial Resource Strain: Low Risk  (03/01/2022)   Overall Financial Resource Strain (CARDIA)    Difficulty of Paying Living Expenses: Not hard at all  Food Insecurity: No Food Insecurity (03/13/2022)   Hunger Vital Sign    Worried About Running Out of Food in the Last Year: Never true    Ran Out of Food in the Last Year: Never true  Transportation Needs: No  Transportation Needs (03/23/2022)   PRAPARE - Administrator, Civil Service (Medical): No    Lack of Transportation (Non-Medical): No  Physical Activity: Inactive (03/23/2022)   Exercise Vital Sign    Days of Exercise per Week: 0 days    Minutes of Exercise per Session: 0 min  Stress: No Stress Concern Present (03/01/2022)   [employer] of Occupational Health - Occupational Stress Questionnaire    Feeling of Stress : Not at all  Social Connections: Moderately Isolated (03/01/2022)   Social Connection and Isolation Panel [NHANES]  Frequency of Communication with Friends and Family: More than three times a week    Frequency of Social Gatherings with Friends and Family: Three times a week    Attends Religious Services: 1 to 4 times per year    Active Member of Clubs or Organizations: No    Attends Banker Meetings: Never    Marital Status: Divorced  Catering manager Violence: Not At Risk (03/13/2022)   Humiliation, Afraid, Rape, and Kick questionnaire    Fear of Current or Ex-Partner: No    Emotionally Abused: No    Physically Abused: No    Sexually Abused: No    Review of Systems: General: Negative for fever, chills, fatigue, weakness. Eyes: Negative for vision changes.  ENT: Negative for hoarseness, difficulty swallowing , nasal congestion. CV: Negative for chest pain, angina, palpitations, dyspnea on exertion, peripheral edema.  Respiratory: Negative for dyspnea at rest, dyspnea on exertion, cough, sputum, wheezing.  GI: See history of present illness. GU:  Negative for dysuria, hematuria, urinary incontinence, urinary frequency, nocturnal urination.  MS: Negative for joint pain, low back pain.  Derm: Negative for rash or itching.  Neuro: Negative for weakness, abnormal sensation, seizure, frequent headaches, memory loss, confusion.  Psych: Negative for anxiety, depression Endo: Negative for unusual weight change.  Heme: Negative for bruising or  bleeding. Allergy: Negative for rash or hives.  Physical Exam: Vital signs in last 24 hours: Temp:  [98.2 F (36.8 C)] 98.2 F (36.8 C) (04/25 0752) Pulse Rate:  [67-82] 67 (04/25 0800) Resp:  [14-16] 16 (04/25 0800) BP: (119-126)/(61-81) 119/61 (04/25 0752) SpO2:  [97 %-99 %] 97 % (04/25 0800) Weight:  [115.2 kg] 115.2 kg (04/24 1220)   General:   Alert,  Well-developed, well-nourished, pleasant and cooperative in NAD Head:  Normocephalic and atraumatic. Eyes:  Sclera clear, no icterus.   Conjunctiva pink. Ears:  Normal auditory acuity. Nose:  No deformity, discharge,  or lesions. Msk:  Symmetrical without gross deformities. Normal posture. Extremities:  Without clubbing or edema. Neurologic:  Alert and  oriented x4;  grossly normal neurologically. Skin:  Intact without significant lesions or rashes. Psych:  Alert and cooperative. Normal mood and affect.   Impression/Plan: ANISH VANA is here for an EGD to be performed for history of gastric ulcers  Risks, benefits, limitations, imponderables and alternatives regarding EGD have been reviewed with the patient. Questions have been answered. All parties agreeable.

## 2022-07-13 NOTE — Anesthesia Postprocedure Evaluation (Signed)
Anesthesia Post Note  Patient: Christopher Salas  Procedure(s) Performed: ESOPHAGOGASTRODUODENOSCOPY (EGD) WITH PROPOFOL  Patient location during evaluation: Phase II Anesthesia Type: General Level of consciousness: awake and alert and oriented Pain management: pain level controlled Vital Signs Assessment: post-procedure vital signs reviewed and stable Respiratory status: spontaneous breathing, nonlabored ventilation and respiratory function stable Cardiovascular status: blood pressure returned to baseline and stable Postop Assessment: no apparent nausea or vomiting Anesthetic complications: no  No notable events documented.   Last Vitals:  Vitals:   07/13/22 0842 07/13/22 0846  BP: (!) 114/46 114/68  Pulse: 67   Resp: 14   Temp: 36.6 C   SpO2: 94%     Last Pain:  Vitals:   07/13/22 0842  TempSrc: Axillary  PainSc: 0-No pain                 [name] C [place]

## 2022-07-13 NOTE — Anesthesia Preprocedure Evaluation (Signed)
Anesthesia Evaluation  Patient identified by MRN, date of birth, ID band Patient awake    Reviewed: Allergy & Precautions, H&P , NPO status , Patient's Chart, lab work & pertinent test results  Airway Mallampati: II  TM Distance: >3 FB Neck ROM: Full    Dental  (+) Dental Advisory Given   Pulmonary neg pulmonary ROS   Pulmonary exam normal breath sounds clear to auscultation       Cardiovascular Exercise Tolerance: Poor hypertension, Pt. on medications Normal cardiovascular exam+ dysrhythmias  Rhythm:Regular Rate:Normal     Neuro/Psych Seizures -,   Neuromuscular disease  negative psych ROS   GI/Hepatic Neg liver ROS, PUD,GERD  Medicated,,  Endo/Other  negative endocrine ROS    Renal/GU negative Renal ROS Bladder dysfunction (prostate cancer)      Musculoskeletal  (+) Arthritis , Osteoarthritis,    Abdominal   Peds negative pediatric ROS (+)  Hematology negative hematology ROS (+)   Anesthesia Other Findings   Reproductive/Obstetrics negative OB ROS                              Anesthesia Physical Anesthesia Plan  ASA: 3  Anesthesia Plan: General   Post-op Pain Management: Minimal or no pain anticipated   Induction: Intravenous  PONV Risk Score and Plan: Propofol infusion  Airway Management Planned: Nasal Cannula and Natural Airway  Additional Equipment:   Intra-op Plan:   Post-operative Plan:   Informed Consent: I have reviewed the patients History and Physical, chart, labs and discussed the procedure including the risks, benefits and alternatives for the proposed anesthesia with the patient or authorized representative who has indicated his/her understanding and acceptance.     Dental advisory given  Plan Discussed with: CRNA and Surgeon  Anesthesia Plan Comments:          Anesthesia Quick Evaluation

## 2022-07-13 NOTE — Transfer of Care (Signed)
Immediate Anesthesia Transfer of Care Note  Patient: Christopher Salas  Procedure(s) Performed: ESOPHAGOGASTRODUODENOSCOPY (EGD) WITH PROPOFOL  Patient Location: Short Stay  Anesthesia Type:General  Level of Consciousness: awake  Airway & Oxygen Therapy: Patient Spontanous Breathing  Post-op Assessment: Report given to RN and Post -op Vital signs reviewed and stable  Post vital signs: Reviewed and stable  Last Vitals:  Vitals Value Taken Time  BP 114/46 07/13/22 0842  Temp 36.6 C 07/13/22 0842  Pulse 67 07/13/22 0842  Resp 14 07/13/22 0842  SpO2 94 % 07/13/22 0842    Last Pain:  Vitals:   07/13/22 0842  TempSrc: Axillary  PainSc: 0-No pain         Complications: No notable events documented.

## 2022-07-13 NOTE — Discharge Instructions (Addendum)
EGD Discharge instructions Please read the instructions outlined below and refer to this sheet in the next few weeks. These discharge instructions provide you with general information on caring for yourself after you leave the hospital. Your doctor may also give you specific instructions. While your treatment has been planned according to the most current medical practices available, unavoidable complications occasionally occur. If you have any problems or questions after discharge, please call your doctor. ACTIVITY You may resume your regular activity but move at a slower pace for the next 24 hours.  Take frequent rest periods for the next 24 hours.  Walking will help expel (get rid of) the air and reduce the bloated feeling in your abdomen.  No driving for 24 hours (because of the anesthesia (medicine) used during the test).  You may shower.  Do not sign any important legal documents or operate any machinery for 24 hours (because of the anesthesia used during the test).  NUTRITION Drink plenty of fluids.  You may resume your normal diet.  Begin with a light meal and progress to your normal diet.  Avoid alcoholic beverages for 24 hours or as instructed by your caregiver.  MEDICATIONS You may resume your normal medications unless your caregiver tells you otherwise.  WHAT YOU CAN EXPECT TODAY You may experience abdominal discomfort such as a feeling of fullness or "gas" pains.  FOLLOW-UP Your doctor will discuss the results of your test with you.  SEEK IMMEDIATE MEDICAL ATTENTION IF ANY OF THE FOLLOWING OCCUR: Excessive nausea (feeling sick to your stomach) and/or vomiting.  Severe abdominal pain and distention (swelling).  Trouble swallowing.  Temperature over 101 F (37.8 C).  Rectal bleeding or vomiting of blood.   Previously noted esophagitis has healed up nicely.  You continue to have inflammation and erosions in your stomach.  The ulcers do look improved and should continue to  heal on pantoprazole twice daily.  Avoid NSAIDs.  Small bowel appeared normal.  Follow-up with GI clinic in 4 months.   I hope you have a great rest of your week!  Hennie Duos. Marletta Lor, D.O. Gastroenterology and Hepatology [facility]

## 2022-07-13 NOTE — Op Note (Signed)
[facility] Patient Name: Christopher Salas Procedure Date: 07/13/2022 8:24 AM MRN: [id number] Date of Birth: [birth year] Attending MD: Hennie Duos. Marletta Lor , [place], [id number] CSN: [id number] Age: 57 Admit Type: Outpatient Procedure:                Upper GI endoscopy Indications:              Follow-up of peptic ulcer Providers:                Hennie Duos. Marletta Lor, DO, Buel Ream. Museum/gallery exhibitions officer, Charity fundraiser,                            Judeth Cornfield. Jessee Avers, Technician Referring MD:              Medicines:                See the Anesthesia note for documentation of the                            administered medications Complications:            No immediate complications. Estimated Blood Loss:     Estimated blood loss was minimal. Procedure:                Pre-Anesthesia Assessment:                           - The anesthesia plan was to use monitored                            anesthesia care (MAC).                           After obtaining informed consent, the endoscope was                            passed under direct vision. Throughout the                            procedure, the patient's blood pressure, pulse, and                            oxygen saturations were monitored continuously. The                            [id number]-H190 ([id number]) scope was introduced through the                            mouth, and advanced to the second part of duodenum.                            The upper GI endoscopy was accomplished without                            difficulty. The patient tolerated the procedure  well. Scope In: 8:36:25 AM Scope Out: 8:39:30 AM Total Procedure Duration: 0 hours 3 minutes 5 seconds  Findings:      The Z-line was regular. Previously noted esophagitis has healed.      Moderate inflammation characterized by erosions and shallow ulcerations       was found in the gastric body and in the gastric antrum. Improved       compared to prior though still with a few  shallow ulcerations.      The duodenal bulb, first portion of the duodenum and second portion of       the duodenum were normal. Impression:               - Z-line regular.                           - Gastritis.                           - Normal duodenal bulb, first portion of the                            duodenum and second portion of the duodenum.                           - No specimens collected. Moderate Sedation:      Per Anesthesia Care Recommendation:           - Patient has a contact number available for                            emergencies. The signs and symptoms of potential                            delayed complications were discussed with the                            patient. Return to normal activities tomorrow.                            Written discharge instructions were provided to the                            patient.                           - Resume previous diet.                           - Continue present medications.                           - Await pathology results.                           - Use a proton pump inhibitor PO BID.                           - No ibuprofen, naproxen, or other  non-steroidal                            anti-inflammatory drugs.                           - Return to GI clinic in 6 months. Procedure Code(s):        --- Professional ---                           [phone], Esophagogastroduodenoscopy, flexible,                            transoral; diagnostic, including collection of                            specimen(s) by brushing or washing, when performed                            (separate procedure) Diagnosis Code(s):        --- Professional ---                           K29.70, Gastritis, unspecified, without bleeding                           K27.9, Peptic ulcer, site unspecified, unspecified                            as acute or chronic, without hemorrhage or                            perforation CPT copyright  2022 American Medical Association. All rights reserved. The codes documented in this report are preliminary and upon coder review may  be revised to meet current compliance requirements. Hennie Duos. Marletta Lor, DO Hennie Duos. Marletta Lor, DO 07/13/2022 8:44:12 AM This report has been signed electronically. Number of Addenda: 0

## 2022-07-17 ENCOUNTER — Telehealth: Payer: Self-pay | Admitting: Family Medicine

## 2022-07-17 NOTE — Telephone Encounter (Signed)
Left message making pt aware to call and schedule an appt sooner rather than later based off of his symptoms. Informed that Dettinger does not have anything except a phone call this Friday pm when Dettinger is on call. Advised pt to call back and see if he can schedule an appt with the provider who is on call.

## 2022-07-17 NOTE — Telephone Encounter (Signed)
Pt does not want to wait till Friday for an apt. No DOD apts for tomorrow.

## 2022-07-17 NOTE — Telephone Encounter (Signed)
Patient states he is feeling weak and shaky. I had him take his BP at home and it is 91/54 P 81 sitting. When patient stood and took bp it was 76/61 P 96. Patient advised that he needs to be evaluated by the ER for his low bp. Patient states he verbalizes understanding and he will go.

## 2022-07-18 ENCOUNTER — Ambulatory Visit: Payer: Medicare Other | Admitting: Nurse Practitioner

## 2022-07-18 ENCOUNTER — Encounter (HOSPITAL_COMMUNITY): Payer: Self-pay | Admitting: Internal Medicine

## 2022-07-18 ENCOUNTER — Telehealth: Payer: Self-pay | Admitting: Cardiology

## 2022-07-18 NOTE — Telephone Encounter (Signed)
Pt c/o medication issue:  1. Name of Medication: Patient is unsure of the name of the medication  2. How are you currently taking this medication (dosage and times per day)?   3. Are you having a reaction (difficulty breathing--STAT)? No  4. What is your medication issue? Patient stated that his BP went down to 90/60 yesterday. Patient stated they had felt very shaky and felt like they were going to pass out. Patient stated that he stopped taking the medication last night and has not taken it this morning. Patient stated that his BP is 125/87 this morning. Patient stated they were unsure the name of the medication. Please advise.

## 2022-07-18 NOTE — Telephone Encounter (Signed)
Patient states he thinks it is Olmesartan.  When at 20 mg he was "feeling better than he does now. He states stopped the medication possibly Sunday. He was so shaky that he could not hold his drink without spilling it all over the place.  He became real weak.  BP was 90/60.  He did NOT go to ED yesterday, per Family doctor call to their nurse.  His daughter (nurse) stated to hydrate and eat some crackers with salt which he did and he did not take the medication.  Today his BP is good and he feels good. He has not taken the medication at all today. Please advise.

## 2022-07-19 NOTE — Telephone Encounter (Signed)
Returned call to patient and made him aware of the below- patient aware and verbalized understanding. He will follow blood pressure.    Rollene Rotunda, MD  [facility] N[facility] Triage5 minutes ago (3:34 PM)    If his SBP comes back up to be above 110 systolic he can restart the Benicar 20 mg.  This is being prescribed because of the previously mildly reduced EF.

## 2022-07-19 NOTE — Telephone Encounter (Signed)
Spoke  to patient  . Patient is  aware if blood pressure  systolic is 110 or above he can start taking Benicar  20 mg .  RN  informed patient to to take blood pressure to today and  wait and take it in the morning if blood pressure is above  take 110   then take medications  Patient voiced understanding.

## 2022-07-19 NOTE — Telephone Encounter (Signed)
Patient stated he has additional question regarding instructions for this medication.

## 2022-08-04 ENCOUNTER — Other Ambulatory Visit (HOSPITAL_COMMUNITY): Payer: Self-pay | Admitting: Orthopedic Surgery

## 2022-08-04 DIAGNOSIS — M5416 Radiculopathy, lumbar region: Secondary | ICD-10-CM | POA: Diagnosis not present

## 2022-08-04 DIAGNOSIS — M545 Low back pain, unspecified: Secondary | ICD-10-CM

## 2022-08-06 ENCOUNTER — Other Ambulatory Visit: Payer: Self-pay | Admitting: Family Medicine

## 2022-08-10 ENCOUNTER — Encounter: Payer: Self-pay | Admitting: Family Medicine

## 2022-08-10 ENCOUNTER — Ambulatory Visit (INDEPENDENT_AMBULATORY_CARE_PROVIDER_SITE_OTHER): Payer: Medicare Other | Admitting: Family Medicine

## 2022-08-10 VITALS — BP 105/69 | HR 84 | Ht 72.0 in | Wt 241.0 lb

## 2022-08-10 DIAGNOSIS — R569 Unspecified convulsions: Secondary | ICD-10-CM | POA: Diagnosis not present

## 2022-08-10 DIAGNOSIS — Z79899 Other long term (current) drug therapy: Secondary | ICD-10-CM | POA: Diagnosis not present

## 2022-08-10 DIAGNOSIS — I10 Essential (primary) hypertension: Secondary | ICD-10-CM

## 2022-08-10 DIAGNOSIS — E785 Hyperlipidemia, unspecified: Secondary | ICD-10-CM

## 2022-08-10 DIAGNOSIS — E782 Mixed hyperlipidemia: Secondary | ICD-10-CM | POA: Diagnosis not present

## 2022-08-10 MED ORDER — FENOFIBRATE 48 MG PO TABS: 48.0000 mg | ORAL_TABLET | Freq: Two times a day (BID) | ORAL | 3 refills | Status: DC

## 2022-08-10 MED ORDER — ROSUVASTATIN CALCIUM 10 MG PO TABS: 10.0000 mg | ORAL_TABLET | Freq: Every day | ORAL | 3 refills | Status: DC

## 2022-08-10 MED ORDER — PHENOBARBITAL 16.2 MG PO TABS: 32.4000 mg | ORAL_TABLET | Freq: Every day | ORAL | 0 refills | Status: DC

## 2022-08-10 MED ORDER — GABAPENTIN 400 MG PO CAPS: 400.0000 mg | ORAL_CAPSULE | Freq: Three times a day (TID) | ORAL | 3 refills | Status: DC

## 2022-08-10 MED ORDER — NIACIN ER (ANTIHYPERLIPIDEMIC) 500 MG PO TBCR: 500.0000 mg | EXTENDED_RELEASE_TABLET | Freq: Every day | ORAL | 3 refills | Status: DC

## 2022-08-10 MED ORDER — SOLIFENACIN SUCCINATE 5 MG PO TABS: 5.0000 mg | ORAL_TABLET | Freq: Every day | ORAL | 3 refills | Status: DC

## 2022-08-10 MED ORDER — PHENOBARBITAL 100 MG PO TABS: 100.0000 mg | ORAL_TABLET | Freq: Every day | ORAL | 0 refills | Status: DC

## 2022-08-10 NOTE — Progress Notes (Signed)
BP 105/69   Pulse 84   Ht 6' (1.829 m)   Wt 241 lb (109.3 kg)   SpO2 96%   BMI 32.69 kg/m    Subjective:   Patient ID: Christopher Salas, male    DOB: 1965/09/12, 57 y.o.   MRN: [id number]  HPI: [name] is a 57 y.o. male presenting on 08/10/2022 for Medical Management of Chronic Issues   HPI Hyperlipidemia Patient is coming in for recheck of his hyperlipidemia. The patient is currently taking niacin and fish oils and fenofibrate and Crestor. They deny any issues with myalgias or history of liver damage from it. They deny any focal numbness or weakness or chest pain.   Hypertension Patient is currently on olmesartan, he said he had to lower back down to 20 mg, and their blood pressure today is 105/69. Patient denies any lightheadedness or dizziness. Patient denies headaches, blurred vision, chest pains, shortness of breath, or weakness. Denies any side effects from medication and is content with current medication.   Seizure disorder recheck Current rx-phenobarbital, 100 mg daily and 32.4 mg daily # meds rx-30/month of the 160/month of 16.2 mg Effectiveness of current meds-works well Adverse reactions form meds-none  Pill count performed-No Last drug screen -09/13/2021 ( high risk q32m, moderate risk q27m, low risk yearly ) Urine drug screen today- Yes Was the N[facility] reviewed-yes  If yes were their any concerning findings? -None Controlled substance contract signed on: Today  Relevant past medical, surgical, family and social history reviewed and updated as indicated. Interim medical history since our last visit reviewed. Allergies and medications reviewed and updated.  Review of Systems  Constitutional:  Negative for chills and fever.  Eyes:  Negative for visual disturbance.  Respiratory:  Negative for shortness of breath and wheezing.   Cardiovascular:  Negative for chest pain and leg swelling.  Musculoskeletal:  Negative for back pain and gait problem.  Skin:  Negative  for rash.  Neurological:  Negative for dizziness and light-headedness.  All other systems reviewed and are negative.   Per HPI unless specifically indicated above   Allergies as of 08/10/2022   No Known Allergies      Medication List        Accurate as of Aug 10, 2022  1:37 PM. If you have any questions, ask your nurse or doctor.          acetaminophen 500 MG tablet Commonly known as: TYLENOL Take 500 mg by mouth 3 (three) times daily.   baclofen 10 MG tablet Commonly known as: LIORESAL Take 10 mg by mouth 3 (three) times daily.   cyclobenzaprine 10 MG tablet Commonly known as: FLEXERIL Take 10 mg by mouth every 8 (eight) hours as needed for muscle spasms.   docusate sodium 100 MG capsule Commonly known as: COLACE Take 1 capsule (100 mg total) by mouth 2 (two) times daily. What changed:  when to take this reasons to take this   fenofibrate 48 MG tablet Commonly known as: TRICOR Take 1 tablet (48 mg total) by mouth 2 (two) times daily.   gabapentin 400 MG capsule Commonly known as: NEURONTIN Take 1 capsule (400 mg total) by mouth 3 (three) times daily.   iron polysaccharides 150 MG capsule Commonly known as: NIFEREX Take 150 mg by mouth daily.   niacin 500 MG ER tablet Commonly known as: VITAMIN B3 Take 1 tablet (500 mg total) by mouth daily with breakfast.   Nubeqa 300 MG tablet Generic drug: darolutamide  Take 600 mg by mouth 2 (two) times daily with a meal.   olmesartan 20 MG tablet Commonly known as: BENICAR Take 20 mg by mouth daily. What changed: Another medication with the same name was removed. Continue taking this medication, and follow the directions you see here. Changed by: Elige Radon Loan Oguin, MD   omega-3 acid ethyl esters 1 g capsule Commonly known as: LOVAZA Take 2 g by mouth 2 (two) times daily.   pantoprazole 40 MG tablet Commonly known as: PROTONIX Take 1 tablet (40 mg total) by mouth 2 (two) times daily.   phenobarbital 16.2  MG tablet Commonly known as: LUMINAL Take 2 tablets (32.4 mg total) by mouth at bedtime.   PHENObarbital 100 MG tablet Commonly known as: LUMINAL Take 1 tablet (100 mg total) by mouth at bedtime.   rosuvastatin 10 MG tablet Commonly known as: CRESTOR Take 1 tablet (10 mg total) by mouth daily.   solifenacin 5 MG tablet Commonly known as: VESICARE Take 1 tablet (5 mg total) by mouth daily.   tolterodine 4 MG 24 hr capsule Commonly known as: DETROL LA Take 4 mg by mouth at bedtime.         Objective:   BP 105/69   Pulse 84   Ht 6' (1.829 m)   Wt 241 lb (109.3 kg)   SpO2 96%   BMI 32.69 kg/m   Wt Readings from Last 3 Encounters:  08/10/22 241 lb (109.3 kg)  07/12/22 254 lb (115.2 kg)  05/08/22 223 lb (101.2 kg)    Physical Exam Vitals and nursing note reviewed.  Constitutional:      General: He is not in acute distress.    Appearance: He is well-developed. He is not diaphoretic.  Eyes:     General: No scleral icterus.    Conjunctiva/sclera: Conjunctivae normal.  Neck:     Thyroid: No thyromegaly.  Cardiovascular:     Rate and Rhythm: Normal rate and regular rhythm.     Heart sounds: Normal heart sounds. No murmur heard. Pulmonary:     Effort: Pulmonary effort is normal. No respiratory distress.     Breath sounds: Normal breath sounds. No wheezing.  Musculoskeletal:        General: Normal range of motion.     Cervical back: Neck supple.  Lymphadenopathy:     Cervical: No cervical adenopathy.  Skin:    General: Skin is warm and dry.     Findings: No rash.  Neurological:     Mental Status: He is alert and oriented to person, place, and time.     Coordination: Coordination normal.  Psychiatric:        Behavior: Behavior normal.       Assessment & Plan:   Problem List Items Addressed This Visit       Cardiovascular and Mediastinum   Hypertension   Relevant Medications   olmesartan (BENICAR) 20 MG tablet   fenofibrate (TRICOR) 48 MG tablet    niacin (VITAMIN B3) 500 MG ER tablet   rosuvastatin (CRESTOR) 10 MG tablet     Other   Hyperlipidemia   Relevant Medications   olmesartan (BENICAR) 20 MG tablet   fenofibrate (TRICOR) 48 MG tablet   niacin (VITAMIN B3) 500 MG ER tablet   rosuvastatin (CRESTOR) 10 MG tablet   Seizures (HCC)   Relevant Medications   gabapentin (NEURONTIN) 400 MG capsule   phenobarbital (LUMINAL) 16.2 MG tablet   PHENObarbital (LUMINAL) 100 MG tablet   Other Visit Diagnoses  Controlled substance agreement signed    -  Primary   Relevant Orders   ToxASSURE Select 13 (MW), Urine     Continue current medicine, seems to be doing well, will do tox assure today.  Follow up plan: Return in about 3 months (around 11/10/2022), or if symptoms worsen or fail to improve, for Hypertension and hyperlipidemia recheck.  Counseling provided for all of the vaccine components Orders Placed This Encounter  Procedures   ToxASSURE Select 13 (MW), Urine    Arville Care, MD [facility] 08/10/2022, 1:37 PM

## 2022-08-15 LAB — TOXASSURE SELECT 13 (MW), URINE

## 2022-08-22 ENCOUNTER — Encounter: Payer: Self-pay | Admitting: Gastroenterology

## 2022-08-31 DIAGNOSIS — C775 Secondary and unspecified malignant neoplasm of intrapelvic lymph nodes: Secondary | ICD-10-CM | POA: Diagnosis not present

## 2022-09-05 ENCOUNTER — Ambulatory Visit (HOSPITAL_COMMUNITY)
Admission: RE | Admit: 2022-09-05 | Discharge: 2022-09-05 | Disposition: A | Discharge status: Home / Self Care | Payer: Medicare Other | Source: Ambulatory Visit | Attending: Orthopedic Surgery | Admitting: Orthopedic Surgery

## 2022-09-05 DIAGNOSIS — M545 Low back pain, unspecified: Secondary | ICD-10-CM | POA: Diagnosis not present

## 2022-09-22 DIAGNOSIS — M545 Low back pain, unspecified: Secondary | ICD-10-CM | POA: Diagnosis not present

## 2022-09-22 DIAGNOSIS — M533 Sacrococcygeal disorders, not elsewhere classified: Secondary | ICD-10-CM | POA: Diagnosis not present

## 2022-09-26 ENCOUNTER — Other Ambulatory Visit: Payer: Self-pay | Admitting: Orthopedic Surgery

## 2022-09-26 DIAGNOSIS — M545 Low back pain, unspecified: Secondary | ICD-10-CM

## 2022-10-20 ENCOUNTER — Ambulatory Visit
Admission: RE | Admit: 2022-10-20 | Discharge: 2022-10-20 | Disposition: A | Discharge status: Home / Self Care | Payer: Medicare Other | Source: Ambulatory Visit | Attending: Orthopedic Surgery | Admitting: Orthopedic Surgery

## 2022-10-20 DIAGNOSIS — M545 Low back pain, unspecified: Secondary | ICD-10-CM

## 2022-10-20 DIAGNOSIS — M533 Sacrococcygeal disorders, not elsewhere classified: Secondary | ICD-10-CM | POA: Diagnosis not present

## 2022-10-28 ENCOUNTER — Telehealth: Payer: Self-pay | Admitting: Gastroenterology

## 2022-10-28 NOTE — Telephone Encounter (Signed)
Tammy, please reach out to patient. I had ordered some labs in 04/2022 for elevated LFTs and he never completed. Subsequent labs by PCP still show elevated LFTs so we still need the labs I originally ordered to rule out liver disease.

## 2022-10-29 ENCOUNTER — Other Ambulatory Visit: Payer: Self-pay | Admitting: Family Medicine

## 2022-10-30 ENCOUNTER — Ambulatory Visit: Payer: Medicare Other | Admitting: Gastroenterology

## 2022-10-30 ENCOUNTER — Encounter: Payer: Self-pay | Admitting: Gastroenterology

## 2022-10-30 VITALS — BP 127/82 | HR 90 | Temp 97.9°F | Ht 72.0 in | Wt 251.4 lb

## 2022-10-30 DIAGNOSIS — R7989 Other specified abnormal findings of blood chemistry: Secondary | ICD-10-CM | POA: Diagnosis not present

## 2022-10-30 DIAGNOSIS — R162 Hepatomegaly with splenomegaly, not elsewhere classified: Secondary | ICD-10-CM

## 2022-10-30 DIAGNOSIS — K219 Gastro-esophageal reflux disease without esophagitis: Secondary | ICD-10-CM | POA: Diagnosis not present

## 2022-10-30 NOTE — Patient Instructions (Addendum)
Complete labs at [facility],  [address], [address].  We will be in touch with results.

## 2022-10-30 NOTE — Progress Notes (Signed)
GI Office Note    Referring Provider: Dettinger, Elige Radon, MD Primary Care Physician:  Dettinger, Elige Radon, MD  Primary Gastroenterologist: Hennie Duos. Marletta Lor, DO   Chief Complaint   Chief Complaint  Patient presents with   Follow-up    Doing well, no issues.    History of Present Illness   Christopher Salas is a 57 y.o. male presenting today for follow up. Last seen 04/2022. H/o gastric ulcers, reflux esophagitis/dysphagia, diarrhea, elevated LFTs.   Today: certain foods cause diarrhea (spicy foods worse). Avoids food triggers. No heartburn. No dysphagia. No N/V. No melena, brbpr. No abdominal pain. Overall feels well.    He did not complete labs to work up elevated LFTs. While inpatient 02/2022, he had Alk phos of 214, AST 174, ALT 56, total bilirubin 1.1, white blood cell count 2100, hemoglobin 11.1 (hemoglobin 10.26 January 2022 postoperatively), Platelets 137,000.  Blood cultures positive for E. coli.  C. difficile antigen and toxin negative.  GI profile negative.   CT abdomen pelvis without contrast 02/2022 showed hepatosplenomegaly, fatty liver, bilateral perinephric stranding consistent with previous obstruction or infection.  Labs from March 27, 2022: Creatinine 1.06, total bilirubin 0.3, alkaline phosphatase 246, AST 32, ALT 19, hemoglobin 11.7 up from 10 one week prior, platelets 506,000, white blood cell count 4800.    Labs 05/08/22: White blood cell count 4100, hemoglobin 13.7, hematocrit 39.5, platelets 254,000.  Albumin 4.5, total bilirubin less than 0.2, alkaline phosphatase 176, AST 21, ALT 13.  Creatinine 0.94.  EGD 06/2022: -gastritis  Colonoscopy March 21, 2022: -Moderate amount of liquid stool in the entire colon, lavage of the area performed using copious amount of sterile water, resulting in clearance with fair visualization. - colon unremarkable. -repeat colonoscopy in 10 years   EGD March 21, 2022: -Mildly severe esophagitis with no bleeding -Mild  Schatzki ring status post dilation -Gastritis -Nonbleeding gastric ulcers -Gastric biopsies showed inflammation, erosions, no infection with H. pylori -Twice daily PPI.  Avoid NSAIDs. -Repeat EGD in 10 to 12 weeks to evaluate healing   Wt Readings from Last 10 Encounters:  10/30/22 251 lb 6.4 oz (114 kg)  08/10/22 241 lb (109.3 kg)  07/12/22 254 lb (115.2 kg)  05/08/22 223 lb (101.2 kg)  04/26/22 230 lb (104.3 kg)  03/27/22 225 lb (102.1 kg)  03/21/22 (P) 227 lb 1.2 oz (103 kg)  01/06/22 265 lb (120.2 kg)  12/07/21 260 lb (117.9 kg)  09/05/21 260 lb (117.9 kg)      Medications   Current Outpatient Medications  Medication Sig Dispense Refill   acetaminophen (TYLENOL) 500 MG tablet Take 500 mg by mouth 3 (three) times daily.     baclofen (LIORESAL) 10 MG tablet TAKE 1 TABLET BY MOUTH THREE TIMES A DAY 30 tablet 5   cyclobenzaprine (FLEXERIL) 10 MG tablet Take 10 mg by mouth every 8 (eight) hours as needed for muscle spasms.     docusate sodium (COLACE) 100 MG capsule Take 1 capsule (100 mg total) by mouth 2 (two) times daily. (Patient taking differently: Take 100 mg by mouth daily as needed for moderate constipation or mild constipation.)     fenofibrate (TRICOR) 48 MG tablet Take 1 tablet (48 mg total) by mouth 2 (two) times daily. 180 tablet 3   gabapentin (NEURONTIN) 400 MG capsule Take 1 capsule (400 mg total) by mouth 3 (three) times daily. 270 capsule 3   iron polysaccharides (NIFEREX) 150 MG capsule Take 150 mg by mouth daily.  LAGEVRIO 200 MG CAPS capsule Take 4 capsules by mouth 2 (two) times daily.     niacin (VITAMIN B3) 500 MG ER tablet Take 1 tablet (500 mg total) by mouth daily with breakfast. 90 tablet 3   olmesartan (BENICAR) 20 MG tablet Take 20 mg by mouth daily.     omega-3 acid ethyl esters (LOVAZA) 1 g capsule Take 2 g by mouth 2 (two) times daily.     pantoprazole (PROTONIX) 40 MG tablet Take 1 tablet (40 mg total) by mouth 2 (two) times daily. 60 tablet 5    PHENObarbital (LUMINAL) 100 MG tablet Take 1 tablet (100 mg total) by mouth at bedtime. 90 tablet 0   phenobarbital (LUMINAL) 16.2 MG tablet Take 2 tablets (32.4 mg total) by mouth at bedtime. 180 tablet 0   rosuvastatin (CRESTOR) 10 MG tablet Take 1 tablet (10 mg total) by mouth daily. 90 tablet 3   solifenacin (VESICARE) 5 MG tablet Take 1 tablet (5 mg total) by mouth daily. 90 tablet 3   tolterodine (DETROL LA) 4 MG 24 hr capsule Take 4 mg by mouth at bedtime.     No current facility-administered medications for this visit.    Allergies   Allergies as of 10/30/2022   (No Known Allergies)        Review of Systems   General: Negative for anorexia, weight loss, fever, chills, fatigue, weakness. ENT: Negative for hoarseness, difficulty swallowing , nasal congestion. CV: Negative for chest pain, angina, palpitations, dyspnea on exertion, peripheral edema.  Respiratory: Negative for dyspnea at rest, dyspnea on exertion, cough, sputum, wheezing.  GI: See history of present illness. GU:  Negative for dysuria, hematuria, urinary incontinence, urinary frequency, nocturnal urination.  Endo: Negative for unusual weight change.     Physical Exam   BP 127/82 (BP Location: Right Arm, Patient Position: Sitting, Cuff Size: Large)   Pulse 90   Temp 97.9 F (36.6 C) (Oral)   Ht 6' (1.829 m)   Wt 251 lb 6.4 oz (114 kg)   SpO2 97%   BMI 34.10 kg/m    General: Well-nourished, well-developed in no acute distress.  Eyes: No icterus. Mouth: Oropharyngeal mucosa moist and pink   Abdomen: Bowel sounds are normal, nontender, nondistended, no hepatosplenomegaly or masses,  no abdominal bruits or hernia, no rebound or guarding.  Rectal: not performed  Extremities: No lower extremity edema. No clubbing or deformities. Neuro: Alert and oriented x 4   Skin: Warm and dry, no jaundice.   Psych: Alert and cooperative, normal mood and affect.  Labs   Lab Results  Component Value Date   NA 144  05/08/2022   CL 106 05/08/2022   K 4.3 05/08/2022   CO2 21 05/08/2022   BUN 12 05/08/2022   CREATININE 0.94 05/08/2022   EGFR 94 05/08/2022   CALCIUM 9.6 05/08/2022   ALBUMIN 4.5 05/08/2022   GLUCOSE 104 (H) 05/08/2022   Lab Results  Component Value Date   ALT 13 05/08/2022   AST 21 05/08/2022   ALKPHOS 176 (H) 05/08/2022   BILITOT <0.2 05/08/2022   Lab Results  Component Value Date   WBC 4.1 05/08/2022   HGB 13.7 05/08/2022   HCT 39.5 05/08/2022   MCV 87 05/08/2022   PLT 254 05/08/2022    Imaging Studies   CT BIOPSY  Result Date: 10/20/2022 CLINICAL DATA:  Left-sided low back, buttock/SI joint, and leg pain. Possible sacroiliac dysfunction. EXAM: CT-GUIDED LEFT SI JOINT INJECTION RADIATION DOSE REDUCTION: This exam  was performed according to the departmental dose-optimization program which includes automated exposure control, adjustment of the mA and/or kV according to patient size and/or use of iterative reconstruction technique. PROCEDURE: After a thorough discussion of risks and benefits of the procedure, including bleeding, infection, injury to nerves, blood vessels, and adjacent structures, verbal and written consent was obtained. The patient was placed prone on the CT table and localization was performed over the sacrum. The target site was marked using CT guidance. The skin was prepped and draped in the usual sterile fashion. After local anesthesia with 1% lidocaine without epinephrine and subsequent deep anesthesia, a 3.5 inch 22 gauge spinal needle was advanced into the left SI joint under intermittent CT guidance. Injection of a small amount of Isovue-M 200 confirmed intra-articular placement. Subsequently, 2 mL of 0.5% bupivacaine were injected into the joint. The needle was removed and a dressing was applied. No complications were observed. IMPRESSION: Successful CT-guided left SI joint injection of anesthetic. Electronically Signed   By: Sebastian Ache M.D.   On: 10/20/2022  10:39    Assessment   *GERD *H/O gastric ulcers *Elevated LFTs *Hepatosplenomegaly  H/O gastric ulcers with documented healing. Remains on PPI BID. Will work towards reducing to once daily if tolerated. Previously had reflux esophagitis/dysphagia but currently no UGI symptoms.   He is due for labs to further evaluate elevated alkphos. History of hepatosplenomegaly on prior CT imaging. No evidence of advance liver disease on EGD.    PLAN   Complete labs.  We will request patient to decrease PPI to once daily.   Leanna Battles. Melvyn Neth, MHS, PA-C [facility]

## 2022-10-30 NOTE — Telephone Encounter (Signed)
Pt is here for office visit today and will be given lab orders to have done.

## 2022-11-06 DIAGNOSIS — M4696 Unspecified inflammatory spondylopathy, lumbar region: Secondary | ICD-10-CM | POA: Diagnosis not present

## 2022-11-09 ENCOUNTER — Other Ambulatory Visit: Payer: Self-pay | Admitting: Orthopedic Surgery

## 2022-11-09 DIAGNOSIS — M545 Low back pain, unspecified: Secondary | ICD-10-CM

## 2022-11-10 ENCOUNTER — Ambulatory Visit (INDEPENDENT_AMBULATORY_CARE_PROVIDER_SITE_OTHER): Payer: Medicare Other | Admitting: Family Medicine

## 2022-11-10 ENCOUNTER — Encounter: Payer: Self-pay | Admitting: Family Medicine

## 2022-11-10 VITALS — BP 114/83 | HR 92 | Ht 72.0 in | Wt 245.0 lb

## 2022-11-10 DIAGNOSIS — R569 Unspecified convulsions: Secondary | ICD-10-CM | POA: Diagnosis not present

## 2022-11-10 DIAGNOSIS — I10 Essential (primary) hypertension: Secondary | ICD-10-CM | POA: Diagnosis not present

## 2022-11-10 DIAGNOSIS — E782 Mixed hyperlipidemia: Secondary | ICD-10-CM

## 2022-11-10 DIAGNOSIS — R972 Elevated prostate specific antigen [PSA]: Secondary | ICD-10-CM | POA: Diagnosis not present

## 2022-11-10 MED ORDER — PHENOBARBITAL 16.2 MG PO TABS: 32.4000 mg | ORAL_TABLET | Freq: Every day | ORAL | 0 refills | Status: DC

## 2022-11-10 MED ORDER — PHENOBARBITAL 100 MG PO TABS
100.0000 mg | ORAL_TABLET | Freq: Every day | ORAL | 0 refills | Status: DC
Start: 2022-11-10 — End: 2023-04-03

## 2022-11-10 NOTE — Progress Notes (Signed)
BP 114/83   Pulse 92   Ht 6' (1.829 m)   Wt 245 lb (111.1 kg)   SpO2 97%   BMI 33.23 kg/m    Subjective:   Patient ID: Christopher Salas, male    DOB: 03/06/1966, 57 y.o.   MRN: [id number]  HPI: Christopher Salas is a 57 y.o. male presenting on 11/10/2022 for Medical Management of Chronic Issues, Hyperlipidemia, and Hypertension   HPI Hypertension Patient is currently on olmesartan, and their blood pressure today is 114/83. Patient denies any lightheadedness or dizziness. Patient denies headaches, blurred vision, chest pains, shortness of breath, or weakness. Denies any side effects from medication and is content with current medication.   Hyperlipidemia Patient is coming in for recheck of his hyperlipidemia. The patient is currently taking Crestor and fenofibrate and niacin. They deny any issues with myalgias or history of liver damage from it. They deny any focal numbness or weakness or chest pain.   Seizure disorder recheck Patient has seizure disorder takes phenobarbital and has been stable and has not had seizures in years.  He seems to be doing well on it.  Relevant past medical, surgical, family and social history reviewed and updated as indicated. Interim medical history since our last visit reviewed. Allergies and medications reviewed and updated.  Review of Systems  Constitutional:  Positive for fatigue. Negative for chills and fever.  Eyes:  Negative for visual disturbance.  Respiratory:  Negative for shortness of breath and wheezing.   Cardiovascular:  Negative for chest pain and leg swelling.  Musculoskeletal:  Positive for arthralgias, back pain and gait problem.  Skin:  Negative for rash.  All other systems reviewed and are negative.   Per HPI unless specifically indicated above   Allergies as of 11/10/2022   No Known Allergies      Medication List        Accurate as of November 10, 2022  2:02 PM. If you have any questions, ask your nurse or doctor.           acetaminophen 500 MG tablet Commonly known as: TYLENOL Take 500 mg by mouth 3 (three) times daily.   baclofen 10 MG tablet Commonly known as: LIORESAL TAKE 1 TABLET BY MOUTH THREE TIMES A DAY   cyclobenzaprine 10 MG tablet Commonly known as: FLEXERIL Take 10 mg by mouth every 8 (eight) hours as needed for muscle spasms.   docusate sodium 100 MG capsule Commonly known as: COLACE Take 1 capsule (100 mg total) by mouth 2 (two) times daily. What changed:  when to take this reasons to take this   fenofibrate 48 MG tablet Commonly known as: TRICOR Take 1 tablet (48 mg total) by mouth 2 (two) times daily.   gabapentin 400 MG capsule Commonly known as: NEURONTIN Take 1 capsule (400 mg total) by mouth 3 (three) times daily.   iron polysaccharides 150 MG capsule Commonly known as: NIFEREX Take 150 mg by mouth daily.   Lagevrio 200 MG Caps capsule Generic drug: molnupiravir EUA Take 4 capsules by mouth 2 (two) times daily.   niacin 500 MG ER tablet Commonly known as: VITAMIN B3 Take 1 tablet (500 mg total) by mouth daily with breakfast.   olmesartan 20 MG tablet Commonly known as: BENICAR Take 20 mg by mouth daily.   omega-3 acid ethyl esters 1 g capsule Commonly known as: LOVAZA Take 2 g by mouth 2 (two) times daily.   pantoprazole 40 MG tablet Commonly known as: PROTONIX  Take 1 tablet (40 mg total) by mouth 2 (two) times daily.   phenobarbital 16.2 MG tablet Commonly known as: LUMINAL Take 2 tablets (32.4 mg total) by mouth at bedtime.   PHENObarbital 100 MG tablet Commonly known as: LUMINAL Take 1 tablet (100 mg total) by mouth at bedtime.   rosuvastatin 10 MG tablet Commonly known as: CRESTOR Take 1 tablet (10 mg total) by mouth daily.   solifenacin 5 MG tablet Commonly known as: VESICARE Take 1 tablet (5 mg total) by mouth daily.   tolterodine 4 MG 24 hr capsule Commonly known as: DETROL LA Take 4 mg by mouth at bedtime.         Objective:    BP 114/83   Pulse 92   Ht 6' (1.829 m)   Wt 245 lb (111.1 kg)   SpO2 97%   BMI 33.23 kg/m   Wt Readings from Last 3 Encounters:  11/10/22 245 lb (111.1 kg)  10/30/22 251 lb 6.4 oz (114 kg)  08/10/22 241 lb (109.3 kg)    Physical Exam Vitals and nursing note reviewed.  Constitutional:      General: He is not in acute distress.    Appearance: He is well-developed. He is not diaphoretic.  Eyes:     General: No scleral icterus.    Conjunctiva/sclera: Conjunctivae normal.  Neck:     Thyroid: No thyromegaly.  Cardiovascular:     Rate and Rhythm: Normal rate and regular rhythm.     Heart sounds: Normal heart sounds. No murmur heard. Pulmonary:     Effort: Pulmonary effort is normal. No respiratory distress.     Breath sounds: Normal breath sounds. No wheezing.  Musculoskeletal:        General: No swelling. Normal range of motion.     Cervical back: Neck supple.  Lymphadenopathy:     Cervical: No cervical adenopathy.  Skin:    General: Skin is warm and dry.     Findings: No rash.  Neurological:     Mental Status: He is alert and oriented to person, place, and time.     Coordination: Coordination normal.  Psychiatric:        Behavior: Behavior normal.       Assessment & Plan:   Problem List Items Addressed This Visit       Cardiovascular and Mediastinum   Hypertension - Primary   Relevant Orders   CBC with Differential/Platelet   CMP14+EGFR   Lipid panel     Other   Hyperlipidemia   Relevant Orders   CBC with Differential/Platelet   CMP14+EGFR   Lipid panel   Seizures (HCC)   Relevant Medications   phenobarbital (LUMINAL) 16.2 MG tablet   PHENObarbital (LUMINAL) 100 MG tablet   Other Visit Diagnoses     Elevated PSA       Relevant Orders   PSA, total and free       Continue current medicine, seems to be doing well. Follow up plan: Return in about 4 months (around 03/12/2023), or if symptoms worsen or fail to improve, for Hypertension and  hyperlipidemia and seizure recheck.  Counseling provided for all of the vaccine components Orders Placed This Encounter  Procedures   CBC with Differential/Platelet   CMP14+EGFR   Lipid panel   PSA, total and free    Arville Care, MD [facility] 11/10/2022, 2:02 PM

## 2022-11-11 LAB — CMP14+EGFR
ALT: 19 IU/L (ref 0–44)
AST: 32 IU/L (ref 0–40)
Albumin: 5 g/dL — ABNORMAL HIGH (ref 3.8–4.9)
Alkaline Phosphatase: 84 IU/L (ref 44–121)
BUN/Creatinine Ratio: 17 (ref 9–20)
BUN: 29 mg/dL — ABNORMAL HIGH (ref 6–24)
Bilirubin Total: 0.4 mg/dL (ref 0.0–1.2)
CO2: 20 mmol/L (ref 20–29)
Calcium: 10.4 mg/dL — ABNORMAL HIGH (ref 8.7–10.2)
Chloride: 102 mmol/L (ref 96–106)
Creatinine, Ser: 1.71 mg/dL — ABNORMAL HIGH (ref 0.76–1.27)
Globulin, Total: 2.3 g/dL (ref 1.5–4.5)
Glucose: 104 mg/dL — ABNORMAL HIGH (ref 70–99)
Potassium: 4.9 mmol/L (ref 3.5–5.2)
Sodium: 140 mmol/L (ref 134–144)
Total Protein: 7.3 g/dL (ref 6.0–8.5)
eGFR: 46 mL/min/{1.73_m2} — ABNORMAL LOW (ref >59)

## 2022-11-11 LAB — PSA, TOTAL AND FREE
PSA, Free: <0.02 ng/mL
Prostate Specific Ag, Serum: <0.1 ng/mL (ref 0.0–4.0)

## 2022-11-11 LAB — CBC WITH DIFFERENTIAL/PLATELET
Basophils Absolute: 0 10*3/uL (ref 0.0–0.2)
Basos: 1 %
EOS (ABSOLUTE): 0.2 10*3/uL (ref 0.0–0.4)
Eos: 4 %
Hematocrit: 35.9 % — ABNORMAL LOW (ref 37.5–51.0)
Hemoglobin: 12.7 g/dL — ABNORMAL LOW (ref 13.0–17.7)
Immature Grans (Abs): 0 10*3/uL (ref 0.0–0.1)
Immature Granulocytes: 0 %
Lymphocytes Absolute: 1.6 10*3/uL (ref 0.7–3.1)
Lymphs: 37 %
MCH: 33.2 pg — ABNORMAL HIGH (ref 26.6–33.0)
MCHC: 35.4 g/dL (ref 31.5–35.7)
MCV: 94 fL (ref 79–97)
Monocytes Absolute: 0.3 10*3/uL (ref 0.1–0.9)
Monocytes: 7 %
Neutrophils Absolute: 2.3 10*3/uL (ref 1.4–7.0)
Neutrophils: 51 %
Platelets: 222 10*3/uL (ref 150–450)
RBC: 3.82 x10E6/uL — ABNORMAL LOW (ref 4.14–5.80)
RDW: 12.8 % (ref 11.6–15.4)
WBC: 4.3 10*3/uL (ref 3.4–10.8)

## 2022-11-11 LAB — LIPID PANEL
Chol/HDL Ratio: 6.5 ratio — ABNORMAL HIGH (ref 0.0–5.0)
Cholesterol, Total: 271 mg/dL — ABNORMAL HIGH (ref 100–199)
HDL: 42 mg/dL (ref >39)
LDL Chol Calc (NIH): 114 mg/dL — ABNORMAL HIGH (ref 0–99)
Triglycerides: 651 mg/dL (ref 0–149)
VLDL Cholesterol Cal: 115 mg/dL — ABNORMAL HIGH (ref 5–40)

## 2022-11-13 ENCOUNTER — Other Ambulatory Visit: Payer: Self-pay | Admitting: Family Medicine

## 2022-11-13 DIAGNOSIS — E781 Pure hyperglyceridemia: Secondary | ICD-10-CM

## 2022-11-13 DIAGNOSIS — N179 Acute kidney failure, unspecified: Secondary | ICD-10-CM

## 2022-11-13 MED ORDER — FENOFIBRATE 145 MG PO TABS: 145.0000 mg | ORAL_TABLET | Freq: Every day | ORAL | 3 refills | Status: DC

## 2022-11-14 ENCOUNTER — Other Ambulatory Visit: Payer: Self-pay

## 2022-11-14 DIAGNOSIS — N289 Disorder of kidney and ureter, unspecified: Secondary | ICD-10-CM

## 2022-11-15 ENCOUNTER — Other Ambulatory Visit: Payer: Self-pay

## 2022-11-15 DIAGNOSIS — R7989 Other specified abnormal findings of blood chemistry: Secondary | ICD-10-CM

## 2022-11-22 ENCOUNTER — Telehealth: Payer: Self-pay | Admitting: Family Medicine

## 2022-11-22 ENCOUNTER — Other Ambulatory Visit: Payer: Medicare Other

## 2022-11-22 DIAGNOSIS — N289 Disorder of kidney and ureter, unspecified: Secondary | ICD-10-CM | POA: Diagnosis not present

## 2022-11-22 NOTE — Telephone Encounter (Signed)
Left message for pt to return call.

## 2022-11-22 NOTE — Telephone Encounter (Signed)
Pt says that since increase on fenofibrate (TRICOR) 145 MG tablet  he feels really shakey. He needs to talk to nurse. Please call back

## 2022-11-23 ENCOUNTER — Other Ambulatory Visit: Payer: Self-pay

## 2022-11-23 DIAGNOSIS — N289 Disorder of kidney and ureter, unspecified: Secondary | ICD-10-CM

## 2022-11-23 LAB — BMP8+EGFR
BUN/Creatinine Ratio: 13 (ref 9–20)
BUN: 29 mg/dL — ABNORMAL HIGH (ref 6–24)
CO2: 20 mmol/L (ref 20–29)
Calcium: 9.8 mg/dL (ref 8.7–10.2)
Chloride: 104 mmol/L (ref 96–106)
Creatinine, Ser: 2.28 mg/dL — ABNORMAL HIGH (ref 0.76–1.27)
Glucose: 104 mg/dL — ABNORMAL HIGH (ref 70–99)
Potassium: 4.7 mmol/L (ref 3.5–5.2)
Sodium: 142 mmol/L (ref 134–144)
eGFR: 32 mL/min/{1.73_m2} — ABNORMAL LOW (ref >59)

## 2022-11-23 NOTE — Telephone Encounter (Signed)
Increased fenofibrate 3 pills per day at last appt.  Shaky, dizzy, real nervous, falling out, hitting the floor  Has weaned back down to 2 tablets  Discussed that he may possibly be dehydrated. Pt knows that he does not drink a lot because of his hx with prostate cancer. Due to this he has to urinate frequently.  Pt is scheduled to reck kidney function next Monday.   Pt is aware that Dr. Louanne Skye will not address this message until next Monday. Pt states that this is fine since he is back down to his regular dosage of Fenofibrate.

## 2022-11-23 NOTE — Telephone Encounter (Signed)
Pt returning missed call. Please call back

## 2022-11-24 ENCOUNTER — Ambulatory Visit
Admission: RE | Admit: 2022-11-24 | Discharge: 2022-11-24 | Disposition: A | Discharge status: Home / Self Care | Payer: Medicare Other | Source: Ambulatory Visit | Attending: Orthopedic Surgery | Admitting: Orthopedic Surgery

## 2022-11-24 DIAGNOSIS — M545 Low back pain, unspecified: Secondary | ICD-10-CM

## 2022-11-24 DIAGNOSIS — M533 Sacrococcygeal disorders, not elsewhere classified: Secondary | ICD-10-CM | POA: Diagnosis not present

## 2022-11-25 ENCOUNTER — Other Ambulatory Visit: Payer: Self-pay | Admitting: Gastroenterology

## 2022-11-27 ENCOUNTER — Other Ambulatory Visit: Payer: Medicare Other

## 2022-11-27 DIAGNOSIS — N289 Disorder of kidney and ureter, unspecified: Secondary | ICD-10-CM | POA: Diagnosis not present

## 2022-11-27 NOTE — Telephone Encounter (Signed)
Left message making pt aware and to call back if needed.

## 2022-11-27 NOTE — Telephone Encounter (Signed)
Yes go ahead and stay at 2 tablets for now and then we will recheck his renal function and reevaluate in the future.

## 2022-11-28 DIAGNOSIS — M47816 Spondylosis without myelopathy or radiculopathy, lumbar region: Secondary | ICD-10-CM | POA: Diagnosis not present

## 2022-11-28 DIAGNOSIS — M533 Sacrococcygeal disorders, not elsewhere classified: Secondary | ICD-10-CM | POA: Diagnosis not present

## 2022-11-28 LAB — BMP8+EGFR
BUN/Creatinine Ratio: 18 (ref 9–20)
BUN: 26 mg/dL — ABNORMAL HIGH (ref 6–24)
CO2: 21 mmol/L (ref 20–29)
Calcium: 9.6 mg/dL (ref 8.7–10.2)
Chloride: 106 mmol/L (ref 96–106)
Creatinine, Ser: 1.48 mg/dL — ABNORMAL HIGH (ref 0.76–1.27)
Glucose: 108 mg/dL — ABNORMAL HIGH (ref 70–99)
Potassium: 4.9 mmol/L (ref 3.5–5.2)
Sodium: 143 mmol/L (ref 134–144)
eGFR: 54 mL/min/{1.73_m2} — ABNORMAL LOW (ref >59)

## 2022-12-14 DIAGNOSIS — M47816 Spondylosis without myelopathy or radiculopathy, lumbar region: Secondary | ICD-10-CM | POA: Diagnosis not present

## 2022-12-27 ENCOUNTER — Encounter: Payer: Self-pay | Admitting: Internal Medicine

## 2023-01-04 DIAGNOSIS — C775 Secondary and unspecified malignant neoplasm of intrapelvic lymph nodes: Secondary | ICD-10-CM | POA: Diagnosis not present

## 2023-01-08 ENCOUNTER — Telehealth: Payer: Self-pay | Admitting: *Deleted

## 2023-01-08 ENCOUNTER — Encounter: Payer: Self-pay | Admitting: *Deleted

## 2023-01-08 ENCOUNTER — Other Ambulatory Visit: Payer: Self-pay | Admitting: *Deleted

## 2023-01-08 DIAGNOSIS — R7989 Other specified abnormal findings of blood chemistry: Secondary | ICD-10-CM

## 2023-01-08 NOTE — Telephone Encounter (Signed)
Pt left vm stating he received a letter to schedule [place].  [place] has been scheduled for 02/08/23, arrive at 9:15 am to check in, NPO after midnight. Sent MyChart message to pt.

## 2023-01-25 DIAGNOSIS — M47816 Spondylosis without myelopathy or radiculopathy, lumbar region: Secondary | ICD-10-CM | POA: Diagnosis not present

## 2023-02-02 ENCOUNTER — Other Ambulatory Visit: Payer: Self-pay

## 2023-02-02 DIAGNOSIS — R7989 Other specified abnormal findings of blood chemistry: Secondary | ICD-10-CM

## 2023-02-06 DIAGNOSIS — M47816 Spondylosis without myelopathy or radiculopathy, lumbar region: Secondary | ICD-10-CM | POA: Diagnosis not present

## 2023-02-08 ENCOUNTER — Ambulatory Visit (HOSPITAL_COMMUNITY)
Admission: RE | Admit: 2023-02-08 | Discharge: 2023-02-08 | Disposition: A | Discharge status: Home / Self Care | Payer: Medicare Other | Source: Ambulatory Visit | Attending: Gastroenterology | Admitting: Gastroenterology

## 2023-02-08 DIAGNOSIS — R7989 Other specified abnormal findings of blood chemistry: Secondary | ICD-10-CM | POA: Diagnosis not present

## 2023-02-10 LAB — CBC WITH DIFFERENTIAL/PLATELET
Basophils Absolute: 0 10*3/uL (ref 0.0–0.2)
Basos: 0 %
EOS (ABSOLUTE): 0.2 10*3/uL (ref 0.0–0.4)
Eos: 4 %
Hematocrit: 33.9 % — ABNORMAL LOW (ref 37.5–51.0)
Hemoglobin: 11.9 g/dL — ABNORMAL LOW (ref 13.0–17.7)
Immature Grans (Abs): 0 10*3/uL (ref 0.0–0.1)
Immature Granulocytes: 1 %
Lymphocytes Absolute: 1.4 10*3/uL (ref 0.7–3.1)
Lymphs: 36 %
MCH: 34.4 pg — ABNORMAL HIGH (ref 26.6–33.0)
MCHC: 35.1 g/dL (ref 31.5–35.7)
MCV: 98 fL — ABNORMAL HIGH (ref 79–97)
Monocytes Absolute: 0.3 10*3/uL (ref 0.1–0.9)
Monocytes: 7 %
Neutrophils Absolute: 2.1 10*3/uL (ref 1.4–7.0)
Neutrophils: 52 %
Platelets: 216 10*3/uL (ref 150–450)
RBC: 3.46 x10E6/uL — ABNORMAL LOW (ref 4.14–5.80)
RDW: 12.1 % (ref 11.6–15.4)
WBC: 3.9 10*3/uL (ref 3.4–10.8)

## 2023-02-10 LAB — HEPATIC FUNCTION PANEL
ALT: 21 IU/L (ref 0–44)
AST: 36 [IU]/L (ref 0–40)
Albumin: 4.8 g/dL (ref 3.8–4.9)
Alkaline Phosphatase: 119 [IU]/L (ref 44–121)
Bilirubin Total: 0.3 mg/dL (ref 0.0–1.2)
Bilirubin, Direct: 0.17 mg/dL (ref 0.00–0.40)
Total Protein: 7 g/dL (ref 6.0–8.5)

## 2023-02-10 LAB — ENHANCED LIVER FIBROSIS (ELF): ELF(TM) Score: 10.55 — ABNORMAL HIGH (ref <9.80)

## 2023-02-10 LAB — GAMMA GT: GGT: 72 [IU]/L — ABNORMAL HIGH (ref 0–65)

## 2023-03-01 ENCOUNTER — Encounter: Payer: Self-pay | Admitting: *Deleted

## 2023-03-01 ENCOUNTER — Other Ambulatory Visit: Payer: Self-pay | Admitting: *Deleted

## 2023-03-01 DIAGNOSIS — N289 Disorder of kidney and ureter, unspecified: Secondary | ICD-10-CM

## 2023-03-05 ENCOUNTER — Ambulatory Visit (HOSPITAL_COMMUNITY)
Admission: RE | Admit: 2023-03-05 | Discharge: 2023-03-05 | Disposition: A | Discharge status: Home / Self Care | Payer: Medicare Other | Source: Ambulatory Visit | Attending: Gastroenterology | Admitting: Gastroenterology

## 2023-03-05 DIAGNOSIS — N281 Cyst of kidney, acquired: Secondary | ICD-10-CM | POA: Diagnosis not present

## 2023-03-05 DIAGNOSIS — N289 Disorder of kidney and ureter, unspecified: Secondary | ICD-10-CM | POA: Diagnosis not present

## 2023-03-05 DIAGNOSIS — K5710 Diverticulosis of small intestine without perforation or abscess without bleeding: Secondary | ICD-10-CM | POA: Diagnosis not present

## 2023-03-05 DIAGNOSIS — R162 Hepatomegaly with splenomegaly, not elsewhere classified: Secondary | ICD-10-CM | POA: Diagnosis not present

## 2023-03-05 LAB — POCT I-STAT CREATININE: Creatinine, Ser: 1.4 mg/dL — ABNORMAL HIGH (ref 0.61–1.24)

## 2023-03-05 MED ORDER — IOHEXOL 300 MG/ML  SOLN
100.0000 mL | Freq: Once | INTRAMUSCULAR | Status: AC | PRN
  Administered 2023-03-05: 100 mL via INTRAVENOUS

## 2023-03-22 DIAGNOSIS — M47816 Spondylosis without myelopathy or radiculopathy, lumbar region: Secondary | ICD-10-CM | POA: Diagnosis not present

## 2023-03-26 ENCOUNTER — Ambulatory Visit: Payer: Medicare Other | Admitting: Family Medicine

## 2023-03-29 ENCOUNTER — Other Ambulatory Visit: Payer: Self-pay | Admitting: Family Medicine

## 2023-03-29 DIAGNOSIS — R569 Unspecified convulsions: Secondary | ICD-10-CM

## 2023-04-02 ENCOUNTER — Other Ambulatory Visit: Payer: Self-pay | Admitting: Family Medicine

## 2023-04-02 DIAGNOSIS — R569 Unspecified convulsions: Secondary | ICD-10-CM

## 2023-04-02 NOTE — Telephone Encounter (Signed)
 Copied from CRM [id number]. Topic: Clinical - Medication Refill >> Apr 02, 2023 12:42 PM Zebedee SAUNDERS wrote: Most Recent Primary Care Visit:  Provider: [facility]  Department: [facility]  Visit Type: LAB  Date: 11/27/2022  Medication: PHENObarbital  (LUMINAL) 100 MG tablet  Has the patient contacted their pharmacy? Yes (Agent: If no, request that the patient contact the pharmacy for the refill. If patient does not wish to contact the pharmacy document the reason why and proceed with request.) (Agent: If yes, when and what did the pharmacy advise?)  Is this the correct pharmacy for this prescription? Yes If no, delete pharmacy and type the correct one.  This is the patient's preferred pharmacy:  [id number] [place],  - [address] [place] [address] Phone: [phone] Fax: [phone]   Has the prescription been filled recently? Yes  Is the patient out of the medication? Yes  Has the patient been seen for an appointment in the last year OR does the patient have an upcoming appointment? Yes  Can we respond through MyChart? Yes  Agent: Please be advised that Rx refills may take up to 3 business days. We ask that you follow-up with your pharmacy.

## 2023-04-03 ENCOUNTER — Other Ambulatory Visit: Payer: Self-pay | Admitting: Family Medicine

## 2023-04-03 DIAGNOSIS — R569 Unspecified convulsions: Secondary | ICD-10-CM

## 2023-04-03 NOTE — Telephone Encounter (Unsigned)
 Copied from CRM [id number]. Topic: Clinical - Medication Refill >> Apr 03, 2023  8:17 AM Wyona SQUIBB wrote: Most Recent Primary Care Visit:  Provider: [facility]  Department: [facility]  Visit Type: LAB  Date: 11/27/2022  Medication: PHENObarbital  (LUMINAL) 100 MG tablet  Has the patient contacted their pharmacy? Yes (Agent: If no, request that the patient contact the pharmacy for the refill. If patient does not wish to contact the pharmacy document the reason why and proceed with request.) (Agent: If yes, when and what did the pharmacy advise?)  Is this the correct pharmacy for this prescription? Yes If no, delete pharmacy and type the correct one.  This is the patient's preferred pharmacy:  [id number] [place], [place] - [address] [place] [address] Phone: [phone] Fax: [phone]   Has the prescription been filled recently? No  Is the patient out of the medication? Yes  Has the patient been seen for an appointment in the last year OR does the patient have an upcoming appointment? Yes  Can we respond through MyChart? No  Agent: Please be advised that Rx refills may take up to 3 business days. We ask that you follow-up with your pharmacy.

## 2023-04-03 NOTE — Telephone Encounter (Signed)
 Copied from CRM [id number]. Topic: Clinical - Medication Refill >> Apr 03, 2023  8:17 AM Wyona SQUIBB wrote: Most Recent Primary Care Visit:  Provider: [facility]  Department: [facility]  Visit Type: LAB  Date: 11/27/2022  Medication: PHENObarbital  (LUMINAL) 100 MG tablet  Has the patient contacted their pharmacy? Yes (Agent: If no, request that the patient contact the pharmacy for the refill. If patient does not wish to contact the pharmacy document the reason why and proceed with request.) (Agent: If yes, when and what did the pharmacy advise?)  Is this the correct pharmacy for this prescription? Yes If no, delete pharmacy and type the correct one.  This is the patient's preferred pharmacy:  [id number] - [place], [place] - [address] [place] [address] Phone: [phone] Fax: [phone]   Has the prescription been filled recently? No  Is the patient out of the medication? Yes  Has the patient been seen for an appointment in the last year OR does the patient have an upcoming appointment? Yes  Can we respond through MyChart? No  Agent: Please be advised that Rx refills may take up to 3 business days. We ask that you follow-up with your pharmacy.

## 2023-04-04 MED ORDER — PHENOBARBITAL 16.2 MG PO TABS
32.4000 mg | ORAL_TABLET | Freq: Every day | ORAL | 0 refills | Status: DC
Start: 2023-04-04 — End: 2023-04-16

## 2023-04-04 MED ORDER — PHENOBARBITAL 100 MG PO TABS: 100.0000 mg | ORAL_TABLET | Freq: Every day | ORAL | 0 refills | Status: DC

## 2023-04-04 NOTE — Telephone Encounter (Signed)
 Sent for 30-day supply to get him through to the appointment time but in the future the patient does need to note that this is controlled and I am not supposed to send it outside of the visit.

## 2023-04-12 DIAGNOSIS — C775 Secondary and unspecified malignant neoplasm of intrapelvic lymph nodes: Secondary | ICD-10-CM | POA: Diagnosis not present

## 2023-04-16 ENCOUNTER — Ambulatory Visit (INDEPENDENT_AMBULATORY_CARE_PROVIDER_SITE_OTHER): Payer: Medicare Other | Admitting: Family Medicine

## 2023-04-16 ENCOUNTER — Encounter: Payer: Self-pay | Admitting: Family Medicine

## 2023-04-16 VITALS — BP 112/78 | HR 77 | Ht 72.0 in | Wt 249.0 lb

## 2023-04-16 DIAGNOSIS — E785 Hyperlipidemia, unspecified: Secondary | ICD-10-CM

## 2023-04-16 DIAGNOSIS — E782 Mixed hyperlipidemia: Secondary | ICD-10-CM

## 2023-04-16 DIAGNOSIS — I10 Essential (primary) hypertension: Secondary | ICD-10-CM | POA: Diagnosis not present

## 2023-04-16 DIAGNOSIS — R569 Unspecified convulsions: Secondary | ICD-10-CM

## 2023-04-16 LAB — LIPID PANEL

## 2023-04-16 MED ORDER — NIACIN ER (ANTIHYPERLIPIDEMIC) 500 MG PO TBCR: 500.0000 mg | EXTENDED_RELEASE_TABLET | Freq: Every day | ORAL | 3 refills | Status: AC

## 2023-04-16 MED ORDER — GABAPENTIN 400 MG PO CAPS: 400.0000 mg | ORAL_CAPSULE | Freq: Three times a day (TID) | ORAL | 3 refills | Status: DC

## 2023-04-16 MED ORDER — SOLIFENACIN SUCCINATE 5 MG PO TABS: 5.0000 mg | ORAL_TABLET | Freq: Every day | ORAL | 3 refills | Status: AC

## 2023-04-16 MED ORDER — ROSUVASTATIN CALCIUM 10 MG PO TABS: 10.0000 mg | ORAL_TABLET | Freq: Every day | ORAL | 3 refills | Status: AC

## 2023-04-16 MED ORDER — PHENOBARBITAL 16.2 MG PO TABS: 32.4000 mg | ORAL_TABLET | Freq: Every day | ORAL | 0 refills | Status: DC

## 2023-04-16 MED ORDER — PHENOBARBITAL 100 MG PO TABS: 100.0000 mg | ORAL_TABLET | Freq: Every day | ORAL | 0 refills | Status: DC

## 2023-04-16 NOTE — Progress Notes (Signed)
BP 112/78   Pulse 77   Ht 6' (1.829 m)   Wt 249 lb (112.9 kg)   SpO2 98%   BMI 33.77 kg/m    Subjective:   Patient ID: Christopher Salas, male    DOB: 02-Jun-1965, 58 y.o.   MRN: [id number]  HPI: Christopher Salas is a 58 y.o. male presenting on 04/16/2023 for Medical Management of Chronic Issues, Hypertension, and Hyperlipidemia   HPI Hypertension Patient is currently on olmesartan, and their blood pressure today is 112/78. Patient denies any lightheadedness or dizziness. Patient denies headaches, blurred vision, chest pains, shortness of breath, or weakness. Denies any side effects from medication and is content with current medication.   Hyperlipidemia Patient is coming in for recheck of his hyperlipidemia. The patient is currently taking fish oils and niacin and fenofibrate and Crestor. They deny any issues with myalgias or history of liver damage from it. They deny any focal numbness or weakness or chest pain.   Seizure disorder Current rx-phenobarbital 132.4 mg daily # meds rx-60 of 16.2 mg tablets and 30 of 100 mg tablets Effectiveness of current meds-works well Adverse reactions form meds-none Pill count performed-No Last drug screen -08/25/2022 ( high risk q17m, moderate risk q88m, low risk yearly ) Urine drug screen today- No Was the NCCSR reviewed- yes  If yes were their any concerning findings? - none Controlled substance contract signed on: 08/25/2022  Relevant past medical, surgical, family and social history reviewed and updated as indicated. Interim medical history since our last visit reviewed. Allergies and medications reviewed and updated.  Review of Systems  Constitutional:  Negative for chills and fever.  Eyes:  Negative for visual disturbance.  Respiratory:  Negative for shortness of breath and wheezing.   Cardiovascular:  Negative for chest pain and leg swelling.  Musculoskeletal:  Positive for arthralgias and back pain. Negative for gait problem.  Skin:   Negative for rash.  Neurological:  Negative for dizziness, weakness and light-headedness.  All other systems reviewed and are negative.   Per HPI unless specifically indicated above   Allergies as of 04/16/2023   No Known Allergies      Medication List        Accurate as of April 16, 2023  2:32 PM. If you have any questions, ask your nurse or doctor.          acetaminophen 500 MG tablet Commonly known as: TYLENOL Take 500 mg by mouth 3 (three) times daily.   baclofen 10 MG tablet Commonly known as: LIORESAL TAKE 1 TABLET BY MOUTH THREE TIMES A DAY   cyclobenzaprine 10 MG tablet Commonly known as: FLEXERIL Take 10 mg by mouth every 8 (eight) hours as needed for muscle spasms.   docusate sodium 100 MG capsule Commonly known as: COLACE Take 1 capsule (100 mg total) by mouth 2 (two) times daily. What changed:  when to take this reasons to take this   fenofibrate 145 MG tablet Commonly known as: TRICOR Take 1 tablet (145 mg total) by mouth daily.   gabapentin 400 MG capsule Commonly known as: NEURONTIN Take 1 capsule (400 mg total) by mouth 3 (three) times daily.   iron polysaccharides 150 MG capsule Commonly known as: NIFEREX Take 150 mg by mouth daily.   Lagevrio 200 MG Caps capsule Generic drug: molnupiravir EUA Take 4 capsules by mouth 2 (two) times daily.   niacin 500 MG ER tablet Commonly known as: VITAMIN B3 Take 1 tablet (500 mg total) by  mouth daily with breakfast.   olmesartan 20 MG tablet Commonly known as: BENICAR Take 20 mg by mouth daily.   omega-3 acid ethyl esters 1 g capsule Commonly known as: LOVAZA Take 2 g by mouth 2 (two) times daily.   pantoprazole 40 MG tablet Commonly known as: PROTONIX Take one tablet once to twice daily before a meal for reflux.   phenobarbital 16.2 MG tablet Commonly known as: LUMINAL Take 2 tablets (32.4 mg total) by mouth at bedtime.   PHENObarbital 100 MG tablet Commonly known as: LUMINAL Take 1  tablet (100 mg total) by mouth at bedtime.   rosuvastatin 10 MG tablet Commonly known as: CRESTOR Take 1 tablet (10 mg total) by mouth daily.   solifenacin 5 MG tablet Commonly known as: VESICARE Take 1 tablet (5 mg total) by mouth daily.   tolterodine 4 MG 24 hr capsule Commonly known as: DETROL LA Take 4 mg by mouth at bedtime.         Objective:   BP 112/78   Pulse 77   Ht 6' (1.829 m)   Wt 249 lb (112.9 kg)   SpO2 98%   BMI 33.77 kg/m   Wt Readings from Last 3 Encounters:  04/16/23 249 lb (112.9 kg)  11/10/22 245 lb (111.1 kg)  10/30/22 251 lb 6.4 oz (114 kg)    Physical Exam Vitals and nursing note reviewed.  Constitutional:      General: He is not in acute distress.    Appearance: He is well-developed. He is not diaphoretic.  Eyes:     General: No scleral icterus.    Conjunctiva/sclera: Conjunctivae normal.  Neck:     Thyroid: No thyromegaly.  Cardiovascular:     Rate and Rhythm: Normal rate and regular rhythm.     Heart sounds: Normal heart sounds. No murmur heard. Pulmonary:     Effort: Pulmonary effort is normal. No respiratory distress.     Breath sounds: Normal breath sounds. No wheezing.  Musculoskeletal:        General: No swelling. Normal range of motion.     Cervical back: Neck supple.  Lymphadenopathy:     Cervical: No cervical adenopathy.  Skin:    General: Skin is warm and dry.     Findings: No rash.  Neurological:     Mental Status: He is alert and oriented to person, place, and time.     Coordination: Coordination normal.  Psychiatric:        Behavior: Behavior normal.       Assessment & Plan:   Problem List Items Addressed This Visit       Cardiovascular and Mediastinum   Hypertension   Relevant Medications   rosuvastatin (CRESTOR) 10 MG tablet   niacin (VITAMIN B3) 500 MG ER tablet   Other Relevant Orders   CBC with Differential/Platelet   CMP14+EGFR   CBC with Differential/Platelet   CMP14+EGFR   Lipid panel      Other   Hyperlipidemia - Primary   Relevant Medications   rosuvastatin (CRESTOR) 10 MG tablet   niacin (VITAMIN B3) 500 MG ER tablet   Other Relevant Orders   CBC with Differential/Platelet   CMP14+EGFR   Lipid panel   CBC with Differential/Platelet   CMP14+EGFR   Lipid panel   Lipid panel   CBC with Differential/Platelet   CMP14+EGFR   Lipid panel   Seizures (HCC)   Relevant Medications   phenobarbital (LUMINAL) 16.2 MG tablet   PHENObarbital (LUMINAL) 100 MG  tablet   gabapentin (NEURONTIN) 400 MG capsule   Other Relevant Orders   CBC with Differential/Platelet    Will check blood work today.  He seems to be doing well.  He is still seeing the back doctor and still sees his cancer doctor as well and that things have been stable with that.  Seizures are stable and blood pressure looks good today. Follow up plan: Return in about 3 months (around 07/15/2023), or if symptoms worsen or fail to improve, for Hypertension and hyperlipidemia and seizure disorder recheck.  Counseling provided for all of the vaccine components Orders Placed This Encounter  Procedures   CBC with Differential/Platelet   CMP14+EGFR   Lipid panel   CBC with Differential/Platelet   CMP14+EGFR   Lipid panel    Arville Care, MD [facility] 04/16/2023, 2:32 PM

## 2023-04-17 LAB — CBC WITH DIFFERENTIAL/PLATELET
Basophils Absolute: 0 10*3/uL (ref 0.0–0.2)
Basos: 0 %
EOS (ABSOLUTE): 0.2 10*3/uL (ref 0.0–0.4)
Eos: 4 %
Hematocrit: 33.6 % — ABNORMAL LOW (ref 37.5–51.0)
Hemoglobin: 11.5 g/dL — ABNORMAL LOW (ref 13.0–17.7)
Immature Grans (Abs): 0 10*3/uL (ref 0.0–0.1)
Immature Granulocytes: 0 %
Lymphocytes Absolute: 1.3 10*3/uL (ref 0.7–3.1)
Lymphs: 34 %
MCH: 32.6 pg (ref 26.6–33.0)
MCHC: 34.2 g/dL (ref 31.5–35.7)
MCV: 95 fL (ref 79–97)
Monocytes Absolute: 0.3 10*3/uL (ref 0.1–0.9)
Monocytes: 7 %
Neutrophils Absolute: 2.1 10*3/uL (ref 1.4–7.0)
Neutrophils: 55 %
Platelets: 215 10*3/uL (ref 150–450)
RBC: 3.53 x10E6/uL — ABNORMAL LOW (ref 4.14–5.80)
RDW: 11.9 % (ref 11.6–15.4)
WBC: 3.8 10*3/uL (ref 3.4–10.8)

## 2023-04-17 LAB — CMP14+EGFR
ALT: 24 IU/L (ref 0–44)
AST: 37 IU/L (ref 0–40)
Albumin: 4.5 g/dL (ref 3.8–4.9)
Alkaline Phosphatase: 83 IU/L (ref 44–121)
BUN/Creatinine Ratio: 15 (ref 9–20)
BUN: 19 mg/dL (ref 6–24)
Bilirubin Total: 0.4 mg/dL (ref 0.0–1.2)
CO2: 20 mmol/L (ref 20–29)
Calcium: 9.9 mg/dL (ref 8.7–10.2)
Chloride: 105 mmol/L (ref 96–106)
Creatinine, Ser: 1.25 mg/dL (ref 0.76–1.27)
Globulin, Total: 2.6 g/dL (ref 1.5–4.5)
Glucose: 98 mg/dL (ref 70–99)
Potassium: 4.3 mmol/L (ref 3.5–5.2)
Sodium: 141 mmol/L (ref 134–144)
Total Protein: 7.1 g/dL (ref 6.0–8.5)
eGFR: 66 mL/min/{1.73_m2} (ref >59)

## 2023-04-17 LAB — LIPID PANEL
Cholesterol, Total: 237 mg/dL — ABNORMAL HIGH (ref 100–199)
HDL: 50 mg/dL (ref >39)
LDL CALC COMMENT:: 4.7 ratio (ref 0.0–5.0)
LDL Chol Calc (NIH): 112 mg/dL — ABNORMAL HIGH (ref 0–99)
Triglycerides: 435 mg/dL — ABNORMAL HIGH (ref 0–149)
VLDL Cholesterol Cal: 75 mg/dL — ABNORMAL HIGH (ref 5–40)

## 2023-04-22 NOTE — Progress Notes (Unsigned)
GI Office Note    Referring Provider: Dettinger, Elige Radon, MD Primary Care Physician:  Dettinger, Elige Radon, MD  Primary Gastroenterologist: Hennie Duos. Marletta Lor, DO   Chief Complaint   No chief complaint on file.   History of Present Illness   Christopher Salas is a 58 y.o. male presenting today for follow up. Last seen 10/2022. H/o gastric ulcers, reflux esophagitis/dysphagia, diarrhea, elevated LFTs, h/o hepatosplenomegaly.    CT***  Labs***  EGD 06/2022: -gastritis   Colonoscopy March 21, 2022: -Moderate amount of liquid stool in the entire colon, lavage of the area performed using copious amount of sterile water, resulting in clearance with fair visualization. - colon unremarkable. -repeat colonoscopy in 10 years   EGD March 21, 2022: -Mildly severe esophagitis with no bleeding -Mild Schatzki ring status post dilation -Gastritis -Nonbleeding gastric ulcers -Gastric biopsies showed inflammation, erosions, no infection with H. pylori -Twice daily PPI.  Avoid NSAIDs. -Repeat EGD in 10 to 12 weeks to evaluate healing     Medications   Current Outpatient Medications  Medication Sig Dispense Refill   acetaminophen (TYLENOL) 500 MG tablet Take 500 mg by mouth 3 (three) times daily.     baclofen (LIORESAL) 10 MG tablet TAKE 1 TABLET BY MOUTH THREE TIMES A DAY 30 tablet 5   cyclobenzaprine (FLEXERIL) 10 MG tablet Take 10 mg by mouth every 8 (eight) hours as needed for muscle spasms.     docusate sodium (COLACE) 100 MG capsule Take 1 capsule (100 mg total) by mouth 2 (two) times daily. (Patient taking differently: Take 100 mg by mouth daily as needed for moderate constipation or mild constipation.)     fenofibrate (TRICOR) 145 MG tablet Take 1 tablet (145 mg total) by mouth daily. 90 tablet 3   gabapentin (NEURONTIN) 400 MG capsule Take 1 capsule (400 mg total) by mouth 3 (three) times daily. 270 capsule 3   iron polysaccharides (NIFEREX) 150 MG capsule Take 150 mg by  mouth daily.     LAGEVRIO 200 MG CAPS capsule Take 4 capsules by mouth 2 (two) times daily.     niacin (VITAMIN B3) 500 MG ER tablet Take 1 tablet (500 mg total) by mouth daily with breakfast. 90 tablet 3   olmesartan (BENICAR) 20 MG tablet Take 20 mg by mouth daily.     omega-3 acid ethyl esters (LOVAZA) 1 g capsule Take 2 g by mouth 2 (two) times daily.     pantoprazole (PROTONIX) 40 MG tablet Take one tablet once to twice daily before a meal for reflux. 60 tablet 5   PHENObarbital (LUMINAL) 100 MG tablet Take 1 tablet (100 mg total) by mouth at bedtime. 90 tablet 0   phenobarbital (LUMINAL) 16.2 MG tablet Take 2 tablets (32.4 mg total) by mouth at bedtime. 180 tablet 0   rosuvastatin (CRESTOR) 10 MG tablet Take 1 tablet (10 mg total) by mouth daily. 90 tablet 3   solifenacin (VESICARE) 5 MG tablet Take 1 tablet (5 mg total) by mouth daily. 90 tablet 3   tolterodine (DETROL LA) 4 MG 24 hr capsule Take 4 mg by mouth at bedtime.     No current facility-administered medications for this visit.    Allergies   Allergies as of 04/23/2023   (No Known Allergies)     Past Medical History   Past Medical History:  Diagnosis Date   Allergy    seasonal   Arthritis    hands   Cancer (HCC)  prostate   Hyperlipidemia    Hypertension    Insomnia    Neuromuscular disorder (HCC)    Left leg numb and weak   Seizures (HCC)    last one 40 years ago    Past Surgical History   Past Surgical History:  Procedure Laterality Date   BACK SURGERY  2020   L4-L6   BALLOON DILATION  03/21/2022   Procedure: BALLOON DILATION;  Surgeon: Lanelle Bal, DO;  [facility];  Service: Endoscopy;;   BIOPSY  03/21/2022   Procedure: BIOPSY;  Surgeon: Lanelle Bal, DO;  [facility];  Service: Endoscopy;;   COLONOSCOPY WITH PROPOFOL N/A 03/21/2022   Procedure: COLONOSCOPY WITH PROPOFOL;  Surgeon: Lanelle Bal, DO;  [facility];  Service: Endoscopy;  Laterality:  N/A;   ESOPHAGOGASTRODUODENOSCOPY N/A 03/21/2022   Procedure: ESOPHAGOGASTRODUODENOSCOPY (EGD);  Surgeon: Lanelle Bal, DO;  [facility];  Service: Endoscopy;  Laterality: N/A;  With possible esophageal dilation   ESOPHAGOGASTRODUODENOSCOPY (EGD) WITH PROPOFOL N/A 03/21/2022   Procedure: ESOPHAGOGASTRODUODENOSCOPY (EGD) WITH PROPOFOL;  Surgeon: Lanelle Bal, DO;  [facility];  Service: Endoscopy;  Laterality: N/A;   ESOPHAGOGASTRODUODENOSCOPY (EGD) WITH PROPOFOL N/A 07/13/2022   Procedure: ESOPHAGOGASTRODUODENOSCOPY (EGD) WITH PROPOFOL;  Surgeon: Lanelle Bal, DO;  [facility];  Service: Endoscopy;  Laterality: N/A;  930am, asa 3   HAND SURGERY Right    LAMINECTOMY AND MICRODISCECTOMY LUMBAR SPINE  2021   LYMPH NODE DISSECTION Bilateral 05/18/2021   Procedure: LYMPH NODE DISSECTION;  Surgeon: Sebastian Ache, MD;  Location: [facility];  Service: Urology;  Laterality: Bilateral;   ROBOT ASSISTED LAPAROSCOPIC RADICAL PROSTATECTOMY N/A 05/18/2021   Procedure: XI ROBOTIC ASSISTED LAPAROSCOPIC RADICAL PROSTATECTOMY WITH INDOCYANINE GREEN DYE INJECTION;  Surgeon: Sebastian Ache, MD;  Location: [facility];  Service: Urology;  Laterality: N/A;  3 HRS    Past Family History   Family History  Problem Relation Age of Onset   Emphysema Mother    Prostate cancer Father    Diabetes Father    CAD Father 35       CABG X 5 (surgery twice)   Colon polyps Brother    Heart disease Maternal Grandfather    Heart disease Paternal Grandfather    Colon cancer Neg Hx    Crohn's disease Neg Hx    Esophageal cancer Neg Hx    Rectal cancer Neg Hx    Stomach cancer Neg Hx    Ulcerative colitis Neg Hx     Past Social History   Social History   Socioeconomic History   Marital status: Divorced    Spouse name: Not on file   Number of children: 1   Years of education: Not on file   Highest education level: 12th grade  Occupational History   Occupation: disability   Tobacco Use   Smoking status: Never    Passive exposure: Never   Smokeless tobacco: Never  Vaping Use   Vaping status: Never Used  Substance and Sexual Activity   Alcohol use: Never   Drug use: Never   Sexual activity: Yes  Other Topics Concern   Not on file  Social History Narrative   Not on file   Social Drivers of Health   Financial Resource Strain: Low Risk  (04/15/2023)   Overall Financial Resource Strain (CARDIA)    Difficulty of Paying Living Expenses: Not hard at all  Food Insecurity: No Food Insecurity (04/15/2023)  Hunger Vital Sign    Worried About Running Out of Food in the Last Year: Never true    Ran Out of Food in the Last Year: Never true  Transportation Needs: No Transportation Needs (04/15/2023)   PRAPARE - Administrator, Civil Service (Medical): No    Lack of Transportation (Non-Medical): No  Physical Activity: Unknown (04/15/2023)   Exercise Vital Sign    Days of Exercise per Week: 0 days    Minutes of Exercise per Session: Not on file  Stress: Stress Concern Present (04/15/2023)   [employer] of Occupational Health - Occupational Stress Questionnaire    Feeling of Stress : Very much  Social Connections: Moderately Isolated (04/15/2023)   Social Connection and Isolation Panel [NHANES]    Frequency of Communication with Friends and Family: More than three times a week    Frequency of Social Gatherings with Friends and Family: More than three times a week    Attends Religious Services: 1 to 4 times per year    Active Member of [employer] or Organizations: No    Attends Engineer, structural: Not on file    Marital Status: Divorced  Intimate Partner Violence: Not At Risk (03/13/2022)   Humiliation, Afraid, Rape, and Kick questionnaire    Fear of Current or Ex-Partner: No    Emotionally Abused: No    Physically Abused: No    Sexually Abused: No    Review of Systems   General: Negative for anorexia, weight loss, fever, chills,  fatigue, weakness. ENT: Negative for hoarseness, difficulty swallowing , nasal congestion. CV: Negative for chest pain, angina, palpitations, dyspnea on exertion, peripheral edema.  Respiratory: Negative for dyspnea at rest, dyspnea on exertion, cough, sputum, wheezing.  GI: See history of present illness. GU:  Negative for dysuria, hematuria, urinary incontinence, urinary frequency, nocturnal urination.  Endo: Negative for unusual weight change.     Physical Exam   There were no vitals taken for this visit.   General: Well-nourished, well-developed in no acute distress.  Eyes: No icterus. Mouth: Oropharyngeal mucosa moist and pink , no lesions erythema or exudate. Lungs: Clear to auscultation bilaterally.  Heart: Regular rate and rhythm, no murmurs rubs or gallops.  Abdomen: Bowel sounds are normal, nontender, nondistended, no hepatosplenomegaly or masses,  no abdominal bruits or hernia , no rebound or guarding.  Rectal: ***  Extremities: No lower extremity edema. No clubbing or deformities. Neuro: Alert and oriented x 4   Skin: Warm and dry, no jaundice.   Psych: Alert and cooperative, normal mood and affect.  Labs   *** Imaging Studies   No results found.  Assessment       PLAN   ***   Leanna Battles. Melvyn Neth, MHS, PA-C [facility]

## 2023-04-23 ENCOUNTER — Encounter: Payer: Self-pay | Admitting: Gastroenterology

## 2023-04-23 ENCOUNTER — Ambulatory Visit: Payer: Medicare Other | Admitting: Gastroenterology

## 2023-04-23 VITALS — BP 117/73 | HR 84 | Temp 97.6°F | Ht 72.0 in | Wt 251.6 lb

## 2023-04-23 DIAGNOSIS — R7989 Other specified abnormal findings of blood chemistry: Secondary | ICD-10-CM

## 2023-04-23 DIAGNOSIS — K760 Fatty (change of) liver, not elsewhere classified: Secondary | ICD-10-CM | POA: Insufficient documentation

## 2023-04-23 DIAGNOSIS — D649 Anemia, unspecified: Secondary | ICD-10-CM

## 2023-04-23 DIAGNOSIS — K219 Gastro-esophageal reflux disease without esophagitis: Secondary | ICD-10-CM | POA: Diagnosis not present

## 2023-04-23 DIAGNOSIS — K2100 Gastro-esophageal reflux disease with esophagitis, without bleeding: Secondary | ICD-10-CM | POA: Diagnosis not present

## 2023-04-23 DIAGNOSIS — M25561 Pain in right knee: Secondary | ICD-10-CM | POA: Diagnosis not present

## 2023-04-23 DIAGNOSIS — K259 Gastric ulcer, unspecified as acute or chronic, without hemorrhage or perforation: Secondary | ICD-10-CM | POA: Diagnosis not present

## 2023-04-23 DIAGNOSIS — M47816 Spondylosis without myelopathy or radiculopathy, lumbar region: Secondary | ICD-10-CM | POA: Diagnosis not present

## 2023-04-23 NOTE — Patient Instructions (Addendum)
Please complete stool test and return to our office.  Complete labs at your convenience at [facility], [address].  Continue pantoprazole 40mg  once daily before breakfast. You can take second dose before supper if needed.

## 2023-04-24 ENCOUNTER — Other Ambulatory Visit: Payer: Self-pay | Admitting: Family Medicine

## 2023-04-24 ENCOUNTER — Ambulatory Visit (INDEPENDENT_AMBULATORY_CARE_PROVIDER_SITE_OTHER): Payer: Medicare Other | Admitting: Gastroenterology

## 2023-04-24 ENCOUNTER — Other Ambulatory Visit: Payer: Self-pay

## 2023-04-24 ENCOUNTER — Telehealth: Payer: Self-pay | Admitting: Family Medicine

## 2023-04-24 DIAGNOSIS — D649 Anemia, unspecified: Secondary | ICD-10-CM | POA: Diagnosis not present

## 2023-04-24 DIAGNOSIS — R569 Unspecified convulsions: Secondary | ICD-10-CM

## 2023-04-24 LAB — HEPATIC FUNCTION PANEL
ALT: 21 [IU]/L (ref 0–44)
AST: 33 [IU]/L (ref 0–40)
Albumin: 4.4 g/dL (ref 3.8–4.9)
Alkaline Phosphatase: 101 [IU]/L (ref 44–121)
Bilirubin Total: 0.2 mg/dL (ref 0.0–1.2)
Bilirubin, Direct: 0.11 mg/dL (ref 0.00–0.40)
Total Protein: 6.4 g/dL (ref 6.0–8.5)

## 2023-04-24 LAB — PROTIME-INR
INR: 1 (ref 0.9–1.2)
Prothrombin Time: 10.9 s (ref 9.1–12.0)

## 2023-04-24 LAB — IFOBT (OCCULT BLOOD): IFOBT: POSITIVE

## 2023-04-24 NOTE — Telephone Encounter (Signed)
 Please advise

## 2023-04-24 NOTE — Telephone Encounter (Signed)
 Copied from CRM [id number]. Topic: Clinical - Prescription Issue >> Apr 24, 2023  1:16 PM [name] wrote: Reason for CRM:  PHENObarbital  (LUMINAL) 100 MG tablet phenobarbital  (LUMINAL) 16.2 MG tablet Patient is calling regards to his medicaton listed pharmacy they have no authorization to fill this prescription, patient states he has a few more days left, and is concerned he may run out before the prescription fill  [phone] Christopher Salas

## 2023-04-25 ENCOUNTER — Encounter: Payer: Self-pay | Admitting: Family Medicine

## 2023-04-25 NOTE — Telephone Encounter (Signed)
 Per pharmacy they do have the medications. Pt is just not able to fill the medication until 2/12.  Left message making pt aware and to call back if needed.  Dr. Steen Eden, can you seen in the database when pt last filled the medication?

## 2023-04-25 NOTE — Telephone Encounter (Signed)
Pt made aware that he cannot fill the medication until 2/12. He thinks he will have enough med until them. Informed that the protocol is that the medication can only be filled every 30d. Pt understood.

## 2023-04-25 NOTE — Telephone Encounter (Signed)
 How do they do not have a prescription, I just sent on January 27 when I saw the patient a prescription of both doses to the pharmacy for 90 days?  Did they not receive that or what happened to it, [facility]

## 2023-04-26 NOTE — Telephone Encounter (Signed)
 I do not understand why he would not be able to get it filled till then, did they short him the number of pills that they gave him last time?  He should have enough to get through until they can refill it?

## 2023-04-26 NOTE — Telephone Encounter (Signed)
 Pt will wait until 2/12 to fill.

## 2023-05-14 ENCOUNTER — Encounter: Payer: Self-pay | Admitting: *Deleted

## 2023-05-17 ENCOUNTER — Encounter: Payer: Self-pay | Admitting: *Deleted

## 2023-05-30 ENCOUNTER — Other Ambulatory Visit: Payer: Self-pay | Admitting: Gastroenterology

## 2023-06-04 DIAGNOSIS — M47816 Spondylosis without myelopathy or radiculopathy, lumbar region: Secondary | ICD-10-CM | POA: Diagnosis not present

## 2023-06-05 ENCOUNTER — Encounter (HOSPITAL_COMMUNITY): Payer: Self-pay | Admitting: Internal Medicine

## 2023-06-05 ENCOUNTER — Ambulatory Visit (HOSPITAL_COMMUNITY)
Admission: RE | Admit: 2023-06-05 | Discharge: 2023-06-05 | Disposition: A | Discharge status: Home / Self Care | Payer: Medicare Other | Attending: Internal Medicine | Admitting: Internal Medicine

## 2023-06-05 ENCOUNTER — Encounter (HOSPITAL_COMMUNITY)
Admission: RE | Disposition: A | Discharge status: Home / Self Care | Payer: Self-pay | Source: Home / Self Care | Attending: Internal Medicine

## 2023-06-05 DIAGNOSIS — D500 Iron deficiency anemia secondary to blood loss (chronic): Secondary | ICD-10-CM | POA: Insufficient documentation

## 2023-06-05 DIAGNOSIS — K633 Ulcer of intestine: Secondary | ICD-10-CM | POA: Insufficient documentation

## 2023-06-05 DIAGNOSIS — Z8711 Personal history of peptic ulcer disease: Secondary | ICD-10-CM | POA: Insufficient documentation

## 2023-06-05 HISTORY — PX: GIVENS CAPSULE STUDY: SHX5432

## 2023-06-05 SURGERY — IMAGING PROCEDURE, GI TRACT, INTRALUMINAL, VIA CAPSULE

## 2023-06-05 NOTE — H&P (Signed)
 Patient presenting for capsule endoscopy due to anemia and heme positive stool

## 2023-06-06 ENCOUNTER — Encounter (HOSPITAL_COMMUNITY): Payer: Self-pay | Admitting: Internal Medicine

## 2023-06-06 DIAGNOSIS — K633 Ulcer of intestine: Secondary | ICD-10-CM | POA: Diagnosis not present

## 2023-06-06 DIAGNOSIS — Z8711 Personal history of peptic ulcer disease: Secondary | ICD-10-CM | POA: Diagnosis not present

## 2023-06-06 DIAGNOSIS — D500 Iron deficiency anemia secondary to blood loss (chronic): Secondary | ICD-10-CM | POA: Diagnosis not present

## 2023-06-13 ENCOUNTER — Other Ambulatory Visit (HOSPITAL_COMMUNITY): Payer: Self-pay | Admitting: Urology

## 2023-06-13 DIAGNOSIS — C775 Secondary and unspecified malignant neoplasm of intrapelvic lymph nodes: Secondary | ICD-10-CM

## 2023-06-13 DIAGNOSIS — C61 Malignant neoplasm of prostate: Secondary | ICD-10-CM

## 2023-06-14 ENCOUNTER — Telehealth: Payer: Self-pay | Admitting: Gastroenterology

## 2023-06-14 DIAGNOSIS — K633 Ulcer of intestine: Secondary | ICD-10-CM

## 2023-06-14 DIAGNOSIS — D500 Iron deficiency anemia secondary to blood loss (chronic): Secondary | ICD-10-CM

## 2023-06-14 NOTE — Op Note (Signed)
  Small Bowel Givens Capsule Study Procedure date:  06/05/2023  Referring Provider:  Leanna Battles. Melvyn Neth, PA-C 3/27/202512:09 PM  PCP:  Dr. Louanne Skye, Elige Radon, MD  Indication for procedure:  Persistent anemia dated back to 02/2022. Recent ifobt positive stool. History of gastric ulcers in setting of NSAIDs in 03/2022, documented healing by EGD 06/2022 (persistent gastritis). Colonoscopy 03/2022 fair prep, unremarkable study. Ileoscopy was not completed.   Patient data:  Wt: 251 lb Ht: 6'  Findings:  capsule reached cecum but overall poor visualization of gastric and small bowel due to retained food/debris, cannot clear small bowel mucosa of additional lesions.  He was noted to have non-bleeding erosion at 2:22:57 (76% small bowel progression). Noted to have non-bleeding ulcers at 4:18:15, 5:01:53 in the very distal ileum (96% and 100% small bowel progression).  First Gastric image:  00:01:18 First Duodenal image: 00:58:14 First Ileo-Cecal Valve image: 05:06:39 First Cecal image: 05:07:11 Gastric Passage time: 69m Small Bowel Passage time:  4h73m  Summary & Recommendations: Incomplete visualization of small bowel mucosa due to significant retained food/debris. See photos included.   He has single non-bleeding erosion noted at 76% small bowel progression. He had several non-bleeding ulcers in the distal ileum at 96% and 100% small bowel progression.   Discussed with Dr. Marletta Lor. He advises ileocolonoscopy for further evaluation.   Continue to avoid oral NSAIDs.   Leanna Battles. Dixon Boos [facility] [phone] 3/27/202512:25 PM

## 2023-06-14 NOTE — Telephone Encounter (Signed)
 Noted. Two full days of clear liquids important.  He probably should not have prep the morning of his procedure as it appears he has delayed emptying

## 2023-06-14 NOTE — Telephone Encounter (Signed)
 Pt was made aware. Pt stated that he started clears after lunch and npo after 8pm. Pt is ready to move forward with scheduling.

## 2023-06-14 NOTE — Telephone Encounter (Signed)
 Please let patient know that his small bowel capsule showed a lot of retained food in the stomach and much of his small bowel.  Did he start clear liquids after lunch the day before his capsule test and nothing to eat or drink after 8pm before his capsule test?  He has ulcers in his distal small bowel that I could see but much of his small bowel was not seen due to food/debris.   -->per Dr. Marletta Lor, he needs ileocolonoscopy to look at the ulcers in the distal small bowel, plan to biopsy to rule out inflammatory bowel disease -->hold iron 7 days before -->ASA 3, room 1/2 ok -->two full days clear liquids -->bisacodyl 10mg  daily for 3 days before bowel prep   [name], also see lab result note for other instructions

## 2023-06-15 ENCOUNTER — Other Ambulatory Visit: Payer: Self-pay | Admitting: *Deleted

## 2023-06-15 DIAGNOSIS — R7989 Other specified abnormal findings of blood chemistry: Secondary | ICD-10-CM

## 2023-06-15 DIAGNOSIS — K760 Fatty (change of) liver, not elsewhere classified: Secondary | ICD-10-CM

## 2023-06-15 NOTE — Telephone Encounter (Signed)
LMOVM to call back to schedule 

## 2023-06-16 ENCOUNTER — Other Ambulatory Visit: Payer: Self-pay | Admitting: Internal Medicine

## 2023-06-19 NOTE — Telephone Encounter (Signed)
LMOVM to call back to schedule 

## 2023-06-20 ENCOUNTER — Encounter: Payer: Self-pay | Admitting: *Deleted

## 2023-06-20 NOTE — Telephone Encounter (Signed)
 He needs ileocolonoscopy so that Dr. Marletta Lor can take biopsy from the ulcers that we were able to see during the capsule study. We can't take biopsies with the capsule. Repeat capsule would allow [place] to see entire small bowel but it will not let us get tissue from ulcer site to help with diagnosis.

## 2023-06-20 NOTE — Telephone Encounter (Signed)
 Pt informed and verbalized understanding. He has been scheduled for 07/04/23. Instructions mailed and pt states he had prep left from last time he was scheduled. He states the expiration date in 8/25.

## 2023-06-20 NOTE — Telephone Encounter (Signed)
 Spoke with pt to get him scheduled for the ileocolonoscopy. He wants to know why he can't do the capsule study again. He states that he will make sure it's done right this time and if he has to, he won't eat anything 2-3 days prior to the capsule study. Please advise. Thank you

## 2023-06-21 ENCOUNTER — Encounter: Payer: Self-pay | Admitting: *Deleted

## 2023-06-29 NOTE — Patient Instructions (Signed)
 Christopher Salas  06/29/2023     @PREFPERIOPPHARMACY @   Your procedure is scheduled on  07/04/2023.   Report to [facility] at  0930  A.M.   Call this number if you have problems the morning of surgery:  [phone]  If you experience any cold or flu symptoms such as cough, fever, chills, shortness of breath, etc. between now and your scheduled surgery, please notify us at the above number.   Remember:  Follow the diet and prep instructions given to you by the office.    You may drink clear liquids until  0730 am on 07/04/2023.    Clear liquids allowed are:                    Water, Juice (No red color; non-citric and without pulp; diabetics please choose diet or no sugar options), Carbonated beverages (diabetics please choose diet or no sugar options), Clear Tea (No creamer, milk, or cream, including half & half and powdered creamer), Black Coffee Only (No creamer, milk or cream, including half & half and powdered creamer), and Clear Sports drink (No red color; diabetics please choose diet or no sugar options)    Take these medicines the morning of surgery with A SIP OF WATER          baclofen, cyclobenzaprine(if needed), gabapentin, pantoprazole, solifenacin.     Do not wear jewelry, make-up or nail polish, including gel polish,  artificial nails, or any other type of covering on natural nails (fingers and  toes).  Do not wear lotions, powders, or perfumes, or deodorant.  Do not shave 48 hours prior to surgery.  Men may shave face and neck.  Do not bring valuables to the hospital.  [facility] is not responsible for any belongings or valuables.  Contacts, dentures or bridgework may not be worn into surgery.  Leave your suitcase in the car.  After surgery it may be brought to your room.  For patients admitted to the hospital, discharge time will be determined by your treatment team.  Patients discharged the day of surgery will not be allowed to drive home and must have  someone with them for 24 hours.    Special instructions:   DO NOT smoke tobacco or vape for 24 hours before your procedure.  Please read over the following fact sheets that you were given. Anesthesia Post-op Instructions and Care and Recovery After Surgery      Colonoscopy, Adult, Care After The following information offers guidance on how to care for yourself after your procedure. Your health care provider may also give you more specific instructions. If you have problems or questions, contact your health care provider. What can I expect after the procedure? After the procedure, it is common to have: A small amount of blood in your stool for 24 hours after the procedure. Some gas. Mild cramping or bloating of your abdomen. Follow these instructions at home: Eating and drinking  Drink enough fluid to keep your urine pale yellow. Follow instructions from your health care provider about eating or drinking restrictions. Resume your normal diet as told by your health care provider. Avoid heavy or fried foods that are hard to digest. Activity Rest as told by your health care provider. Avoid sitting for a long time without moving. Get up to take short walks every 1-2 hours. This is important to improve blood flow and breathing. Ask for help if you feel weak or  unsteady. Return to your normal activities as told by your health care provider. Ask your health care provider what activities are safe for you. Managing cramping and bloating  Try walking around when you have cramps or feel bloated. If directed, apply heat to your abdomen as told by your health care provider. Use the heat source that your health care provider recommends, such as a moist heat pack or a heating pad. Place a towel between your skin and the heat source. Leave the heat on for 20-30 minutes. Remove the heat if your skin turns bright red. This is especially important if you are unable to feel pain, heat, or cold. You have  a greater risk of getting burned. General instructions If you were given a sedative during the procedure, it can affect you for several hours. Do not drive or operate machinery until your health care provider says that it is safe. For the first 24 hours after the procedure: Do not sign important documents. Do not drink alcohol. Do your regular daily activities at a slower pace than normal. Eat soft foods that are easy to digest. Take over-the-counter and prescription medicines only as told by your health care provider. Keep all follow-up visits. This is important. Contact a health care provider if: You have blood in your stool 2-3 days after the procedure. Get help right away if: You have more than a small spotting of blood in your stool. You have large blood clots in your stool. You have swelling of your abdomen. You have nausea or vomiting. You have a fever. You have increasing pain in your abdomen that is not relieved with medicine. These symptoms may be an emergency. Get help right away. Call 911. Do not wait to see if the symptoms will go away. Do not drive yourself to the hospital. Summary After the procedure, it is common to have a small amount of blood in your stool. You may also have mild cramping and bloating of your abdomen. If you were given a sedative during the procedure, it can affect you for several hours. Do not drive or operate machinery until your health care provider says that it is safe. Get help right away if you have a lot of blood in your stool, nausea or vomiting, a fever, or increased pain in your abdomen. This information is not intended to replace advice given to you by your health care provider. Make sure you discuss any questions you have with your health care provider. Document Revised: 04/18/2022 Document Reviewed: 10/27/2020 Elsevier Patient Education  2024 [employer].General Anesthesia, Adult, Care After The following information offers guidance on  how to care for yourself after your procedure. Your health care provider may also give you more specific instructions. If you have problems or questions, contact your health care provider. What can I expect after the procedure? After the procedure, it is common for people to: Have pain or discomfort at the IV site. Have nausea or vomiting. Have a sore throat or hoarseness. Have trouble concentrating. Feel cold or chills. Feel weak, sleepy, or tired (fatigue). Have soreness and body aches. These can affect parts of the body that were not involved in surgery. Follow these instructions at home: For the time period you were told by your health care provider:  Rest. Do not participate in activities where you could fall or become injured. Do not drive or use machinery. Do not drink alcohol. Do not take sleeping pills or medicines that cause drowsiness. Do not make important  decisions or sign legal documents. Do not take care of children on your own. General instructions Drink enough fluid to keep your urine pale yellow. If you have sleep apnea, surgery and certain medicines can increase your risk for breathing problems. Follow instructions from your health care provider about wearing your sleep device: Anytime you are sleeping, including during daytime naps. While taking prescription pain medicines, sleeping medicines, or medicines that make you drowsy. Return to your normal activities as told by your health care provider. Ask your health care provider what activities are safe for you. Take over-the-counter and prescription medicines only as told by your health care provider. Do not use any products that contain nicotine or tobacco. These products include cigarettes, chewing tobacco, and vaping devices, such as e-cigarettes. These can delay incision healing after surgery. If you need help quitting, ask your health care provider. Contact a health care provider if: You have nausea or vomiting  that does not get better with medicine. You vomit every time you eat or drink. You have pain that does not get better with medicine. You cannot urinate or have bloody urine. You develop a skin rash. You have a fever. Get help right away if: You have trouble breathing. You have chest pain. You vomit blood. These symptoms may be an emergency. Get help right away. Call 911. Do not wait to see if the symptoms will go away. Do not drive yourself to the hospital. Summary After the procedure, it is common to have a sore throat, hoarseness, nausea, vomiting, or to feel weak, sleepy, or fatigue. For the time period you were told by your health care provider, do not drive or use machinery. Get help right away if you have difficulty breathing, have chest pain, or vomit blood. These symptoms may be an emergency. This information is not intended to replace advice given to you by your health care provider. Make sure you discuss any questions you have with your health care provider. Document Revised: 06/03/2021 Document Reviewed: 06/03/2021 Elsevier Patient Education  2024 [employer].

## 2023-07-02 ENCOUNTER — Encounter (HOSPITAL_COMMUNITY)
Admission: RE | Admit: 2023-07-02 | Discharge: 2023-07-02 | Disposition: A | Discharge status: Home / Self Care | Source: Ambulatory Visit | Attending: Internal Medicine | Admitting: Internal Medicine

## 2023-07-02 ENCOUNTER — Encounter (HOSPITAL_COMMUNITY): Payer: Self-pay

## 2023-07-02 ENCOUNTER — Other Ambulatory Visit: Payer: Self-pay

## 2023-07-02 VITALS — Ht 72.0 in | Wt 230.0 lb

## 2023-07-02 DIAGNOSIS — I10 Essential (primary) hypertension: Secondary | ICD-10-CM

## 2023-07-02 DIAGNOSIS — I429 Cardiomyopathy, unspecified: Secondary | ICD-10-CM

## 2023-07-02 NOTE — Pre-Procedure Instructions (Signed)
 Patient no show PAT appointment today. PAT visit completed with patient over the phone. He states he did not know he had this PAT appointment.

## 2023-07-04 ENCOUNTER — Ambulatory Visit (HOSPITAL_COMMUNITY)
Admission: RE | Admit: 2023-07-04 | Discharge: 2023-07-04 | Disposition: A | Discharge status: Home / Self Care | Attending: Internal Medicine | Admitting: Internal Medicine

## 2023-07-04 ENCOUNTER — Encounter (HOSPITAL_COMMUNITY)
Admission: RE | Disposition: A | Discharge status: Home / Self Care | Payer: Self-pay | Source: Home / Self Care | Attending: Internal Medicine

## 2023-07-04 ENCOUNTER — Ambulatory Visit (HOSPITAL_COMMUNITY): Admitting: Anesthesiology

## 2023-07-04 ENCOUNTER — Encounter (HOSPITAL_COMMUNITY): Payer: Self-pay | Admitting: Internal Medicine

## 2023-07-04 DIAGNOSIS — I10 Essential (primary) hypertension: Secondary | ICD-10-CM | POA: Diagnosis not present

## 2023-07-04 DIAGNOSIS — K633 Ulcer of intestine: Secondary | ICD-10-CM | POA: Insufficient documentation

## 2023-07-04 DIAGNOSIS — K529 Noninfective gastroenteritis and colitis, unspecified: Secondary | ICD-10-CM

## 2023-07-04 DIAGNOSIS — I447 Left bundle-branch block, unspecified: Secondary | ICD-10-CM | POA: Insufficient documentation

## 2023-07-04 DIAGNOSIS — D509 Iron deficiency anemia, unspecified: Secondary | ICD-10-CM

## 2023-07-04 DIAGNOSIS — K5000 Crohn's disease of small intestine without complications: Secondary | ICD-10-CM | POA: Diagnosis not present

## 2023-07-04 DIAGNOSIS — K219 Gastro-esophageal reflux disease without esophagitis: Secondary | ICD-10-CM | POA: Insufficient documentation

## 2023-07-04 DIAGNOSIS — I429 Cardiomyopathy, unspecified: Secondary | ICD-10-CM

## 2023-07-04 HISTORY — PX: COLONOSCOPY: SHX5424

## 2023-07-04 SURGERY — COLONOSCOPY
Anesthesia: General

## 2023-07-04 MED ORDER — PROPOFOL 10 MG/ML IV BOLUS
INTRAVENOUS | Status: DC | PRN
  Administered 2023-07-04: 60 mg via INTRAVENOUS

## 2023-07-04 MED ORDER — PROPOFOL 500 MG/50ML IV EMUL
INTRAVENOUS | Status: DC | PRN
  Administered 2023-07-04: 15 ug/kg/min via INTRAVENOUS

## 2023-07-04 MED ORDER — LIDOCAINE HCL (CARDIAC) PF 100 MG/5ML IV SOSY
PREFILLED_SYRINGE | INTRAVENOUS | Status: DC | PRN
  Administered 2023-07-04: 50 mg via INTRATRACHEAL

## 2023-07-04 MED ORDER — LACTATED RINGERS IV SOLN: INTRAVENOUS | Status: DC | PRN

## 2023-07-04 NOTE — Transfer of Care (Signed)
 Immediate Anesthesia Transfer of Care Note  Patient: Christopher Salas  Procedure(s) Performed: COLONOSCOPY  Patient Location: Short Stay  Anesthesia Type:General  Level of Consciousness: awake  Airway & Oxygen Therapy: Patient Spontanous Breathing  Post-op Assessment: Report given to RN  Post vital signs: Reviewed and stable  Last Vitals:  Vitals Value Taken Time  BP    Temp    Pulse    Resp    SpO2      Last Pain:  Vitals:   07/04/23 0821  TempSrc:   PainSc: 6       Patients Stated Pain Goal: 6 (07/04/23 0752)  Complications: No notable events documented.

## 2023-07-04 NOTE — Discharge Instructions (Addendum)
  Colonoscopy Discharge Instructions  Read the instructions outlined below and refer to this sheet in the next few weeks. These discharge instructions provide you with general information on caring for yourself after you leave the hospital. Your doctor may also give you specific instructions. While your treatment has been planned according to the most current medical practices available, unavoidable complications occasionally occur.   ACTIVITY You may resume your regular activity, but move at a slower pace for the next 24 hours.  Take frequent rest periods for the next 24 hours.  Walking will help get rid of the air and reduce the bloated feeling in your belly (abdomen).  No driving for 24 hours (because of the medicine (anesthesia) used during the test).   Do not sign any important legal documents or operate any machinery for 24 hours (because of the anesthesia used during the test).  NUTRITION Drink plenty of fluids.  You may resume your normal diet as instructed by your doctor.  Begin with a light meal and progress to your normal diet. Heavy or fried foods are harder to digest and may make you feel sick to your stomach (nauseated).  Avoid alcoholic beverages for 24 hours or as instructed.  MEDICATIONS You may resume your normal medications unless your doctor tells you otherwise.  WHAT YOU CAN EXPECT TODAY Some feelings of bloating in the abdomen.  Passage of more gas than usual.  Spotting of blood in your stool or on the toilet paper.  IF YOU HAD POLYPS REMOVED DURING THE COLONOSCOPY: No aspirin products for 7 days or as instructed.  No alcohol for 7 days or as instructed.  Eat a soft diet for the next 24 hours.  FINDING OUT THE RESULTS OF YOUR TEST Not all test results are available during your visit. If your test results are not back during the visit, make an appointment with your caregiver to find out the results. Do not assume everything is normal if you have not heard from your  caregiver or the medical facility. It is important for you to follow up on all of your test results.  SEEK IMMEDIATE MEDICAL ATTENTION IF: You have more than a spotting of blood in your stool.  Your belly is swollen (abdominal distention).  You are nauseated or vomiting.  You have a temperature over 101.  You have abdominal pain or discomfort that is severe or gets worse throughout the day.   Your colonoscopy revealed 3 small ulcers in the end portion of your small bowel.  I took samples of this.  We will call with results.  Otherwise unremarkable.  Continue on oral iron.  Limit NSAID use as best you can.  Follow-up in GI office in 2 to 3 months.  I hope you have a great rest of your week!  Rolando Cliche. Mordechai April, D.O. Gastroenterology and Hepatology [facility]

## 2023-07-04 NOTE — Op Note (Signed)
 [facility] Patient Name: Christopher Salas Procedure Date: 07/04/2023 8:00 AM MRN: [id number] Date of Birth: 22-Apr-1965 Attending MD: Rolando Cliche. Mordechai April , [place], [id number] CSN: [id number] Age: 58 Admit Type: Outpatient Procedure:                Colonoscopy Indications:              Iron deficiency anemia, Abnormal capsule endoscopy Providers:                Rolando Cliche. Mordechai April, DO, Crystal Page, Jolee Naval                            Tech., Technician Referring MD:              Medicines:                See the Anesthesia note for documentation of the                            administered medications Complications:            No immediate complications. Estimated Blood Loss:     Estimated blood loss was minimal. Procedure:                Pre-Anesthesia Assessment:                           - The anesthesia plan was to use monitored                            anesthesia care (MAC).                           After obtaining informed consent, the colonoscope                            was passed under direct vision. Throughout the                            procedure, the patient's blood pressure, pulse, and                            oxygen saturations were monitored continuously. The                            [id number]-HQ190L ([id number]) scope was introduced through                            the anus and advanced to the the terminal ileum,                            with identification of the appendiceal orifice and                            IC valve. The colonoscopy was performed without                            difficulty. The patient  tolerated the procedure                            well. The quality of the bowel preparation was                            evaluated using the BBPS [facility] Bowel Preparation                            Scale) with scores of: Right Colon = 2 (minor                            amount of residual staining, small fragments of                            stool  and/or opaque liquid, but mucosa seen well),                            Transverse Colon = 2 (minor amount of residual                            staining, small fragments of stool and/or opaque                            liquid, but mucosa seen well) and Left Colon = 2                            (minor amount of residual staining, small fragments                            of stool and/or opaque liquid, but mucosa seen                            well). The total BBPS score equals 6. The quality                            of the bowel preparation was good. Scope In: 8:27:18 AM Scope Out: 8:50:27 AM Scope Withdrawal Time: 0 hours 19 minutes 32 seconds  Total Procedure Duration: 0 hours 23 minutes 9 seconds  Findings:      The distal ileum contained 3 small ulcers approx 15-20 cm proximal to IC       valve. No bleeding was present. No stigmata of recent bleeding were       seen. Biopsies were taken with a cold forceps for histology.      The exam was otherwise without abnormality. Impression:               - 3 ulcers in the distal ileum. Biopsied.                           - The examination was otherwise normal. Moderate Sedation:      Per Anesthesia Care Recommendation:           - Patient has a contact number available for  emergencies. The signs and symptoms of potential                            delayed complications were discussed with the                            patient. Return to normal activities tomorrow.                            Written discharge instructions were provided to the                            patient.                           - Resume previous diet.                           - Continue present medications.                           - Await pathology results.                           - Repeat colonoscopy date to be determined after                            pending pathology results are reviewed for                             surveillance based on pathology results.                           - Return to GI clinic in 3 months. Continue on Iron. Procedure Code(s):        --- Professional ---                           [phone], Colonoscopy, flexible; with biopsy, single                            or multiple Diagnosis Code(s):        --- Professional ---                           K63.3, Ulcer of intestine                           D50.9, Iron deficiency anemia, unspecified CPT copyright 2022 American Medical Association. All rights reserved. The codes documented in this report are preliminary and upon coder review may  be revised to meet current compliance requirements. Hennie Duos. Marletta Lor, DO Hennie Duos. Marletta Lor, DO 07/04/2023 8:58:58 AM This report has been signed electronically. Number of Addenda: 0

## 2023-07-04 NOTE — Anesthesia Postprocedure Evaluation (Signed)
 Anesthesia Post Note  Patient: EDMAR BLANKENBURG  Procedure(s) Performed: COLONOSCOPY  Patient location during evaluation: PACU Anesthesia Type: General Level of consciousness: awake and alert Pain management: pain level controlled Vital Signs Assessment: post-procedure vital signs reviewed and stable Respiratory status: spontaneous breathing, nonlabored ventilation, respiratory function stable and patient connected to nasal cannula oxygen Cardiovascular status: stable and blood pressure returned to baseline Postop Assessment: no apparent nausea or vomiting Anesthetic complications: no  No notable events documented.   Last Vitals:  Vitals:   07/04/23 0853 07/04/23 0904  BP: (!) 89/52 (!) 133/108  Pulse: 63   Resp: 16   Temp: 36.8 C   SpO2: 99%     Last Pain:  Vitals:   07/04/23 0853  TempSrc: Oral  PainSc:                  [place]

## 2023-07-04 NOTE — H&P (Signed)
 Primary Care Physician:  Dettinger, Lucio Sabin, MD Primary Gastroenterologist:  Dr. Mordechai April  Pre-Procedure History & Physical: HPI:  Christopher Salas is a 58 y.o. male is here for a colonoscopy to be performed for anemia, abnormal capsule study of the terminal ileum.  Past Medical History:  Diagnosis Date   Allergy    seasonal   Arthritis    hands   Cancer (HCC)    prostate   Hyperlipidemia    Hypertension    Insomnia    Neuromuscular disorder (HCC)    Left leg numb and weak   Seizures (HCC)    last one 40 years ago    Past Surgical History:  Procedure Laterality Date   BACK SURGERY  2020   L4-L6   BALLOON DILATION  03/21/2022   Procedure: BALLOON DILATION;  Surgeon: Vinetta Greening, DO;  [facility];  Service: Endoscopy;;   BIOPSY  03/21/2022   Procedure: BIOPSY;  Surgeon: Vinetta Greening, DO;  [facility]: [facility];  Service: Endoscopy;;   COLONOSCOPY WITH PROPOFOL N/A 03/21/2022   Procedure: COLONOSCOPY WITH PROPOFOL;  Surgeon: Vinetta Greening, DO;  [facility];  Service: Endoscopy;  Laterality: N/A;   ESOPHAGOGASTRODUODENOSCOPY N/A 03/21/2022   Procedure: ESOPHAGOGASTRODUODENOSCOPY (EGD);  Surgeon: Vinetta Greening, DO;  [facility];  Service: Endoscopy;  Laterality: N/A;  With possible esophageal dilation   ESOPHAGOGASTRODUODENOSCOPY (EGD) WITH PROPOFOL N/A 03/21/2022   Procedure: ESOPHAGOGASTRODUODENOSCOPY (EGD) WITH PROPOFOL;  Surgeon: Vinetta Greening, DO;  [facility];  Service: Endoscopy;  Laterality: N/A;   ESOPHAGOGASTRODUODENOSCOPY (EGD) WITH PROPOFOL N/A 07/13/2022   Procedure: ESOPHAGOGASTRODUODENOSCOPY (EGD) WITH PROPOFOL;  Surgeon: Vinetta Greening, DO;  [facility];  Service: Endoscopy;  Laterality: N/A;  930am, asa 3   GIVENS CAPSULE STUDY N/A 06/05/2023   Procedure: IMAGING PROCEDURE, GI TRACT, INTRALUMINAL, VIA CAPSULE;  Surgeon: Vinetta Greening, DO;  Location: [facility];  Service: Endoscopy;   Laterality: N/A;  7:00 am   HAND SURGERY Right    LAMINECTOMY AND MICRODISCECTOMY LUMBAR SPINE  2021   LYMPH NODE DISSECTION Bilateral 05/18/2021   Procedure: LYMPH NODE DISSECTION;  Surgeon: Osborn Blaze, MD;  Location: [facility];  Service: Urology;  Laterality: Bilateral;   ROBOT ASSISTED LAPAROSCOPIC RADICAL PROSTATECTOMY N/A 05/18/2021   Procedure: XI ROBOTIC ASSISTED LAPAROSCOPIC RADICAL PROSTATECTOMY WITH INDOCYANINE GREEN DYE INJECTION;  Surgeon: Osborn Blaze, MD;  Location: [facility];  Service: Urology;  Laterality: N/A;  3 HRS    Prior to Admission medications   Medication Sig Start Date End Date Taking? Authorizing Provider  baclofen (LIORESAL) 10 MG tablet TAKE 1 TABLET BY MOUTH THREE TIMES A DAY 10/30/22  Yes Dettinger, Lucio Sabin, MD  cyclobenzaprine (FLEXERIL) 10 MG tablet Take 10 mg by mouth every 8 (eight) hours as needed for muscle spasms.   Yes [provider]  olmesartan (BENICAR) 40 MG tablet Take 40 mg by mouth daily. 01/25/23  Yes [provider]  omega-3 acid ethyl esters (LOVAZA) 1 g capsule Take 2 g by mouth 2 (two) times daily.   Yes [provider]  pantoprazole (PROTONIX) 40 MG tablet TAKE ONE TABLET ONCE TO TWICE DAILY BEFORE A MEAL FOR REFLUX. 05/30/23  Yes [name], PA-C  PHENObarbital (LUMINAL) 100 MG tablet Take 1 tablet (100 mg total) by mouth at bedtime. 04/16/23  Yes Dettinger, Lucio Sabin, MD  phenobarbital (LUMINAL) 16.2 MG tablet Take 2 tablets (32.4 mg total) by mouth  at bedtime. 04/16/23  Yes Dettinger, Lucio Sabin, MD  rosuvastatin (CRESTOR) 10 MG tablet Take 1 tablet (10 mg total) by mouth daily. 04/16/23  Yes Dettinger, Lucio Sabin, MD  acetaminophen (TYLENOL) 500 MG tablet Take 500 mg by mouth 3 (three) times daily.    [provider]  diclofenac Sodium (VOLTAREN) 1 % GEL Apply topically 4 (four) times daily.    [provider]  docusate sodium (COLACE) 100 MG capsule Take 1 capsule (100 mg total) by mouth 2 (two) times  daily. Patient taking differently: Take 100 mg by mouth daily as needed for moderate constipation or mild constipation. 05/18/21   Carrolyn Clan, PA-C  fenofibrate (TRICOR) 145 MG tablet Take 1 tablet (145 mg total) by mouth daily. 11/13/22   Dettinger, Lucio Sabin, MD  gabapentin (NEURONTIN) 400 MG capsule Take 1 capsule (400 mg total) by mouth 3 (three) times daily. 04/16/23   Dettinger, Lucio Sabin, MD  iron polysaccharides (NIFEREX) 150 MG capsule Take 150 mg by mouth daily.    [provider]  niacin (VITAMIN B3) 500 MG ER tablet Take 1 tablet (500 mg total) by mouth daily with breakfast. 04/16/23   Dettinger, Lucio Sabin, MD  solifenacin (VESICARE) 5 MG tablet Take 1 tablet (5 mg total) by mouth daily. 04/16/23   Dettinger, Lucio Sabin, MD  tolterodine (DETROL LA) 4 MG 24 hr capsule Take 4 mg by mouth at bedtime. 06/19/22   [provider]    Allergies as of 06/20/2023   (No Known Allergies)    Family History  Problem Relation Age of Onset   Emphysema Mother    Prostate cancer Father    Diabetes Father    CAD Father 73       CABG X 5 (surgery twice)   Colon polyps Brother    Heart disease Maternal Grandfather    Heart disease Paternal Grandfather    Colon cancer Neg Hx    Crohn's disease Neg Hx    Esophageal cancer Neg Hx    Rectal cancer Neg Hx    Stomach cancer Neg Hx    Ulcerative colitis Neg Hx     Social History   Socioeconomic History   Marital status: Divorced    Spouse name: Not on file   Number of children: 1   Years of education: Not on file   Highest education level: 12th grade  Occupational History   Occupation: disability  Tobacco Use   Smoking status: Never    Passive exposure: Never   Smokeless tobacco: Never  Vaping Use   Vaping status: Never Used  Substance and Sexual Activity   Alcohol use: Never   Drug use: Never   Sexual activity: Yes  Other Topics Concern   Not on file  Social History Narrative   Not on file   Social Drivers of Health    Financial Resource Strain: Low Risk  (04/15/2023)   Overall Financial Resource Strain (CARDIA)    Difficulty of Paying Living Expenses: Not hard at all  Food Insecurity: No Food Insecurity (04/15/2023)   Hunger Vital Sign    Worried About Running Out of Food in the Last Year: Never true    Ran Out of Food in the Last Year: Never true  Transportation Needs: No Transportation Needs (04/15/2023)   PRAPARE - Administrator, Civil Service (Medical): No    Lack of Transportation (Non-Medical): No  Physical Activity: Unknown (04/15/2023)   Exercise Vital Sign    Days  of Exercise per Week: 0 days    Minutes of Exercise per Session: Not on file  Stress: Stress Concern Present (04/15/2023)   [employer] of Occupational Health - Occupational Stress Questionnaire    Feeling of Stress : Very much  Social Connections: Moderately Isolated (04/15/2023)   Social Connection and Isolation Panel [NHANES]    Frequency of Communication with Friends and Family: More than three times a week    Frequency of Social Gatherings with Friends and Family: More than three times a week    Attends Religious Services: 1 to 4 times per year    Active Member of Clubs or Organizations: No    Attends Banker Meetings: Not on file    Marital Status: Divorced  Intimate Partner Violence: Not At Risk (03/13/2022)   Humiliation, Afraid, Rape, and Kick questionnaire    Fear of Current or Ex-Partner: No    Emotionally Abused: No    Physically Abused: No    Sexually Abused: No    Review of Systems: See HPI, otherwise negative ROS  Physical Exam: Vital signs in last 24 hours: Temp:  [98.2 F (36.8 C)] 98.2 F (36.8 C) (04/16 0752) Pulse Rate:  [68] 68 (04/16 0752) Resp:  [15] 15 (04/16 0752) BP: (117)/(66) 117/66 (04/16 0752) SpO2:  [97 %] 97 % (04/16 0752)   General:   Alert,  Well-developed, well-nourished, pleasant and cooperative in NAD Head:  Normocephalic and atraumatic. Eyes:   Sclera clear, no icterus.   Conjunctiva pink. Ears:  Normal auditory acuity. Nose:  No deformity, discharge,  or lesions. Msk:  Symmetrical without gross deformities. Normal posture. Extremities:  Without clubbing or edema. Neurologic:  Alert and  oriented x4;  grossly normal neurologically. Skin:  Intact without significant lesions or rashes. Psych:  Alert and cooperative. Normal mood and affect.  Impression/Plan: Christopher Salas is here for a colonoscopy to be performed for anemia, abnormal capsule of the terminal ileum.  The risks of the procedure including infection, bleed, or perforation as well as benefits, limitations, alternatives and imponderables have been reviewed with the patient. Questions have been answered. All parties agreeable.

## 2023-07-04 NOTE — Anesthesia Preprocedure Evaluation (Signed)
 Anesthesia Evaluation  Patient identified by MRN, date of birth, ID band Patient awake    Airway Mallampati: II  TM Distance: >3 FB Neck ROM: Full    Dental no notable dental hx. (+) Teeth Intact   Pulmonary    Pulmonary exam normal        Cardiovascular Exercise Tolerance: Good hypertension, Normal cardiovascular exam(-) dysrhythmias  Rhythm:Regular Rate:Normal  LBBB EF 45-50%   Neuro/Psych Seizures -,   Neuromuscular disease    GI/Hepatic Neg liver ROS, PUD,GERD  ,,  Endo/Other  negative endocrine ROS    Renal/GU negative Renal ROS  negative genitourinary   Musculoskeletal  (+) Arthritis ,    Abdominal Normal abdominal exam  (+)   Peds  Hematology  (+) Blood dyscrasia, anemia   Anesthesia Other Findings   Reproductive/Obstetrics                             Anesthesia Physical Anesthesia Plan  ASA: 3  Anesthesia Plan: General   Post-op Pain Management:    Induction: Intravenous  PONV Risk Score and Plan: 0 and Propofol infusion  Airway Management Planned: Nasal Cannula  Additional Equipment:   Intra-op Plan:   Post-operative Plan:   Informed Consent: I have reviewed the patients History and Physical, chart, labs and discussed the procedure including the risks, benefits and alternatives for the proposed anesthesia with the patient or authorized representative who has indicated his/her understanding and acceptance.       Plan Discussed with:   Anesthesia Plan Comments:        Anesthesia Quick Evaluation

## 2023-07-05 ENCOUNTER — Encounter (HOSPITAL_COMMUNITY): Payer: Self-pay | Admitting: Internal Medicine

## 2023-07-05 LAB — SURGICAL PATHOLOGY

## 2023-07-09 ENCOUNTER — Ambulatory Visit (HOSPITAL_COMMUNITY)
Admission: RE | Admit: 2023-07-09 | Discharge: 2023-07-09 | Disposition: A | Discharge status: Home / Self Care | Source: Ambulatory Visit | Attending: Urology | Admitting: Urology

## 2023-07-09 DIAGNOSIS — N281 Cyst of kidney, acquired: Secondary | ICD-10-CM | POA: Diagnosis not present

## 2023-07-09 DIAGNOSIS — C775 Secondary and unspecified malignant neoplasm of intrapelvic lymph nodes: Secondary | ICD-10-CM | POA: Diagnosis not present

## 2023-07-09 DIAGNOSIS — S29002A Unspecified injury of muscle and tendon of back wall of thorax, initial encounter: Secondary | ICD-10-CM | POA: Diagnosis not present

## 2023-07-09 DIAGNOSIS — C61 Malignant neoplasm of prostate: Secondary | ICD-10-CM | POA: Diagnosis present

## 2023-07-09 DIAGNOSIS — M19071 Primary osteoarthritis, right ankle and foot: Secondary | ICD-10-CM | POA: Diagnosis not present

## 2023-07-09 DIAGNOSIS — M19072 Primary osteoarthritis, left ankle and foot: Secondary | ICD-10-CM | POA: Diagnosis not present

## 2023-07-09 DIAGNOSIS — K4090 Unilateral inguinal hernia, without obstruction or gangrene, not specified as recurrent: Secondary | ICD-10-CM | POA: Diagnosis not present

## 2023-07-09 MED ORDER — TECHNETIUM TC 99M MEDRONATE IV KIT
21.4000 | PACK | Freq: Once | INTRAVENOUS | Status: AC
  Administered 2023-07-09: 21.4 via INTRAVENOUS

## 2023-07-10 NOTE — Progress Notes (Unsigned)
 Cardiology Office Note:   Date:  07/11/2023  ID:  Christopher Salas, DOB 07-05-1965, MRN [id number] PCP: Dettinger, Lucio Sabin, MD  [facility] Providers Cardiologist:  Eilleen Grates, MD {  History of Present Illness:   Christopher Salas is a 58 y.o. male who presents for evaluation of LBBB.  He was referred by  Dettinger, Lucio Sabin, MD.  He has no past cardiac history.    He was found incidentally to have a left bundle branch block.  Perfusion study demonstrated no ischemia.    His EF was mildly reduced at 45 - 50%.       At the last visit I increased his Benicar .  Since I last saw him he has had no new cardiovascular complaints.  He is being managed for prostate cancer.  He thinks the therapy is making him fatigued.  He also does not sleep very well at night.  He does still push himself to do weed eating and pushing lawnmower and walk.  He is not describing any acute shortness of breath, PND or orthopnea.  He had no new palpitations, presyncope or syncope.  His weights been relatively stable.  He had no new edema.  ROS: As stated in the HPI and negative for all other systems.  Studies Reviewed:    EKG:   EKG Interpretation Date/Time:  Wednesday July 11 2023 14:20:24 EDT Ventricular Rate:  68 PR Interval:  132 QRS Duration:  84 QT Interval:  398 QTC Calculation: 423 R Axis:   72  Text Interpretation: Normal sinus rhythm Nonspecific ST abnormality When compared with ECG of 13-Mar-2022 11:05, LBBB is no longer present Confirmed by Eilleen Grates ([id number]) on 07/11/2023 2:35:19 PM    Risk Assessment/Calculations:              Physical Exam:   VS:  BP 110/78   Pulse 68   Ht 6' (1.829 m)   Wt 254 lb (115.2 kg)   BMI 34.45 kg/m    Wt Readings from Last 3 Encounters:  07/11/23 254 lb (115.2 kg)  07/02/23 230 lb (104.3 kg)  04/23/23 251 lb 9.6 oz (114.1 kg)     GEN: Well nourished, well developed in no acute distress NECK: No JVD; No carotid bruits CARDIAC: RRR, no murmurs,  rubs, gallops RESPIRATORY:  Clear to auscultation without rales, wheezing or rhonchi  ABDOMEN: Soft, non-tender, non-distended EXTREMITIES:  No edema; No deformity   ASSESSMENT AND PLAN:    LBBB: Actually had a negative perfusion study previously.  His left bundle branch seems to be rate related as is not present on the most recent EKG.  No further workup or change in therapy.   CARDIOMYOPATHY: I will follow-up with an echocardiogram in December.  His blood pressure has been low and he does not really tolerate med titration.  His EF is low normal.  No change in therapy.  SNORE: He has not wanted a sleep study because he thinks he just does not sleep enough at night and would not be useful.   FATIGUE:   This is multifactorial probably with some sleep apnea and poor sleep habits.  He is up-to-date with blood work.  There is no other clear etiology.  I will defer to his primary provider as to whether he needs another TSH.  I do not see this updated.  He does have some mild anemia.  I do not however suspect a cardiac etiology.   Follow up with me in two years  or sooner based on the results of the echo  Signed, Eilleen Grates, MD

## 2023-07-11 ENCOUNTER — Ambulatory Visit: Payer: Medicare Other | Admitting: Cardiology

## 2023-07-11 ENCOUNTER — Encounter: Payer: Self-pay | Admitting: Cardiology

## 2023-07-11 VITALS — BP 110/78 | HR 68 | Ht 72.0 in | Wt 254.0 lb

## 2023-07-11 DIAGNOSIS — I429 Cardiomyopathy, unspecified: Secondary | ICD-10-CM

## 2023-07-11 DIAGNOSIS — I10 Essential (primary) hypertension: Secondary | ICD-10-CM

## 2023-07-11 DIAGNOSIS — I447 Left bundle-branch block, unspecified: Secondary | ICD-10-CM | POA: Diagnosis not present

## 2023-07-11 NOTE — Patient Instructions (Signed)
 Medication Instructions:  The current medical regimen is effective;  continue present plan and medications.  *If you need a refill on your cardiac medications before your next appointment, please call your pharmacy*  Testing/Procedures: Your physician has requested that you have an echocardiogram in December 2025. Echocardiography is a painless test that uses sound waves to create images of your heart. It provides your doctor with information about the size and shape of your heart and how well your heart's chambers and valves are working. This procedure takes approximately one hour. There are no restrictions for this procedure. Please do NOT wear cologne, perfume, aftershave, or lotions (deodorant is allowed). Please arrive 15 minutes prior to your appointment time.  Please note: We ask at that you not bring children with you during ultrasound (echo/ vascular) testing. Due to room size and safety concerns, children are not allowed in the ultrasound rooms during exams. Our front office staff cannot provide observation of children in our lobby area while testing is being conducted. An adult accompanying a patient to their appointment will only be allowed in the ultrasound room at the discretion of the ultrasound technician under special circumstances. We apologize for any inconvenience.  This will be completed at [facility] and you will be contacted to be scheduled.  Follow-Up: At [facility], you and your health needs are our priority.  As part of our continuing mission to provide you with exceptional heart care, our providers are all part of one team.  This team includes your primary Cardiologist (physician) and Advanced Practice Providers or APPs (Physician Assistants and Nurse Practitioners) who all work together to provide you with the care you need, when you need it.  Your next appointment:   2 year(s)  Provider:   Eilleen Grates, MD    We recommend signing up for the patient  portal called "MyChart".  Sign up information is provided on this After Visit Summary.  MyChart is used to connect with patients for Virtual Visits (Telemedicine).  Patients are able to view lab/test results, encounter notes, upcoming appointments, etc.  Non-urgent messages can be sent to your provider as well.   To learn more about what you can do with MyChart, go to [email or web address].

## 2023-07-14 ENCOUNTER — Other Ambulatory Visit: Payer: Self-pay | Admitting: Cardiology

## 2023-07-18 ENCOUNTER — Ambulatory Visit (INDEPENDENT_AMBULATORY_CARE_PROVIDER_SITE_OTHER): Payer: Medicare Other | Admitting: Family Medicine

## 2023-07-18 ENCOUNTER — Encounter: Payer: Self-pay | Admitting: Family Medicine

## 2023-07-18 VITALS — BP 96/61 | HR 71 | Ht 72.0 in | Wt 253.0 lb

## 2023-07-18 DIAGNOSIS — I10 Essential (primary) hypertension: Secondary | ICD-10-CM

## 2023-07-18 DIAGNOSIS — R569 Unspecified convulsions: Secondary | ICD-10-CM

## 2023-07-18 DIAGNOSIS — E782 Mixed hyperlipidemia: Secondary | ICD-10-CM

## 2023-07-18 DIAGNOSIS — Z79899 Other long term (current) drug therapy: Secondary | ICD-10-CM | POA: Diagnosis not present

## 2023-07-18 MED ORDER — PHENOBARBITAL 100 MG PO TABS
100.0000 mg | ORAL_TABLET | Freq: Every day | ORAL | 0 refills | Status: DC
Start: 2023-07-18 — End: 2023-10-16

## 2023-07-18 MED ORDER — PHENOBARBITAL 16.2 MG PO TABS: 32.4000 mg | ORAL_TABLET | Freq: Every day | ORAL | 0 refills | Status: DC

## 2023-07-18 MED ORDER — OLMESARTAN MEDOXOMIL 20 MG PO TABS: 20.0000 mg | ORAL_TABLET | Freq: Every day | ORAL | 3 refills | Status: DC

## 2023-07-18 NOTE — Progress Notes (Signed)
 BP 96/61   Pulse 71   Ht 6' (1.829 m)   Wt 253 lb (114.8 kg)   SpO2 98%   BMI 34.31 kg/m    Subjective:   Patient ID: Christopher Salas, male    DOB: Feb 29, 1966, 58 y.o.   MRN: [id number]  HPI: Christopher Salas is a 58 y.o. male presenting on 07/18/2023 for Medical Management of Chronic Issues, Hyperlipidemia, and Hypertension   HPI Hypertension Patient is currently on olmesartan , and their blood pressure today is 96/61. Patient denies any lightheadedness or dizziness. Patient denies headaches, blurred vision, chest pains, shortness of breath, or weakness. Denies any side effects from medication and is content with current medication.   Hyperlipidemia Patient is coming in for recheck of his hyperlipidemia. The patient is currently taking fish oils and niacin  and Crestor . They deny any issues with myalgias or history of liver damage from it. They deny any focal numbness or weakness or chest pain.   GERD with history of ulcers Patient is currently on pantoprazole .  She denies any major symptoms or abdominal pain or belching or burping. She denies any blood in her stool or lightheadedness or dizziness.   Seizure Current rx-phenobarbital  100 mg daily and 32.4 mg daily # meds rx-month supply of both Effectiveness of current meds-works well Adverse reactions form meds-none Pill count performed-No Last drug screen -08/25/2022 ( high risk q57m, moderate risk q58m, low risk yearly ) Urine drug screen today- Yes Was the NCCSR reviewed-yes  If yes were their any concerning findings? -None Controlled substance contract signed on: Today  Relevant past medical, surgical, family and social history reviewed and updated as indicated. Interim medical history since our last visit reviewed. Allergies and medications reviewed and updated.  Review of Systems  Constitutional:  Negative for chills and fever.  Eyes:  Negative for discharge.  Respiratory:  Negative for shortness of breath and wheezing.    Cardiovascular:  Negative for chest pain and leg swelling.  Gastrointestinal:  Negative for abdominal distention and abdominal pain.  Musculoskeletal:  Positive for arthralgias and back pain. Negative for gait problem.  Skin:  Negative for rash.  All other systems reviewed and are negative.   Per HPI unless specifically indicated above   Allergies as of 07/18/2023   No Known Allergies      Medication List        Accurate as of July 18, 2023  1:11 PM. If you have any questions, ask your nurse or doctor.          acetaminophen  500 MG tablet Commonly known as: TYLENOL  Take 500 mg by mouth 3 (three) times daily.   baclofen  10 MG tablet Commonly known as: LIORESAL  TAKE 1 TABLET BY MOUTH THREE TIMES A DAY   cyclobenzaprine  10 MG tablet Commonly known as: FLEXERIL  Take 10 mg by mouth every 8 (eight) hours as needed for muscle spasms.   docusate sodium  100 MG capsule Commonly known as: COLACE Take 1 capsule (100 mg total) by mouth 2 (two) times daily. What changed:  when to take this reasons to take this   fenofibrate  145 MG tablet Commonly known as: TRICOR  Take 1 tablet (145 mg total) by mouth daily.   gabapentin  400 MG capsule Commonly known as: NEURONTIN  Take 1 capsule (400 mg total) by mouth 3 (three) times daily.   iron polysaccharides 150 MG capsule Commonly known as: NIFEREX Take 150 mg by mouth daily.   niacin  500 MG ER tablet Commonly known as: VITAMIN  B3 Take 1 tablet (500 mg total) by mouth daily with breakfast.   olmesartan  20 MG tablet Commonly known as: BENICAR  Take 1 tablet (20 mg total) by mouth daily. What changed:  medication strength how much to take Changed by: Lucio Sabin Rasheen Schewe   omega-3 acid ethyl esters 1 g capsule Commonly known as: LOVAZA  Take 2 g by mouth 2 (two) times daily.   pantoprazole  40 MG tablet Commonly known as: PROTONIX  TAKE ONE TABLET ONCE TO TWICE DAILY BEFORE A MEAL FOR REFLUX.   PHENObarbital  100 MG  tablet Commonly known as: LUMINAL Take 1 tablet (100 mg total) by mouth at bedtime.   phenobarbital  16.2 MG tablet Commonly known as: LUMINAL Take 2 tablets (32.4 mg total) by mouth at bedtime.   rosuvastatin  10 MG tablet Commonly known as: CRESTOR  Take 1 tablet (10 mg total) by mouth daily.   solifenacin  5 MG tablet Commonly known as: VESICARE  Take 1 tablet (5 mg total) by mouth daily.   tolterodine  4 MG 24 hr capsule Commonly known as: DETROL  LA Take 4 mg by mouth at bedtime.   Voltaren  1 % Gel Generic drug: diclofenac  Sodium Apply topically 4 (four) times daily.         Objective:   BP 96/61   Pulse 71   Ht 6' (1.829 m)   Wt 253 lb (114.8 kg)   SpO2 98%   BMI 34.31 kg/m   Wt Readings from Last 3 Encounters:  07/18/23 253 lb (114.8 kg)  07/11/23 254 lb (115.2 kg)  07/02/23 230 lb (104.3 kg)    Physical Exam Vitals and nursing note reviewed.  Constitutional:      General: He is not in acute distress.    Appearance: He is well-developed. He is not diaphoretic.  Eyes:     General: No scleral icterus.    Conjunctiva/sclera: Conjunctivae normal.  Neck:     Thyroid : No thyromegaly.  Cardiovascular:     Rate and Rhythm: Normal rate and regular rhythm.     Heart sounds: Normal heart sounds. No murmur heard. Pulmonary:     Effort: Pulmonary effort is normal. No respiratory distress.     Breath sounds: Normal breath sounds. No wheezing.  Musculoskeletal:        General: No swelling. Normal range of motion.     Cervical back: Neck supple.  Lymphadenopathy:     Cervical: No cervical adenopathy.  Skin:    General: Skin is warm and dry.     Findings: No rash.  Neurological:     Mental Status: He is alert and oriented to person, place, and time.     Coordination: Coordination normal.  Psychiatric:        Behavior: Behavior normal.       Assessment & Plan:   Problem List Items Addressed This Visit       Cardiovascular and Mediastinum   Hypertension    Relevant Medications   olmesartan  (BENICAR ) 20 MG tablet   Other Relevant Orders   CMP14+EGFR   CBC with Differential/Platelet     Other   Hyperlipidemia - Primary   Relevant Medications   olmesartan  (BENICAR ) 20 MG tablet   Seizures (HCC)   Relevant Medications   PHENObarbital  (LUMINAL) 100 MG tablet   phenobarbital  (LUMINAL) 16.2 MG tablet   Other Relevant Orders   CMP14+EGFR   CBC with Differential/Platelet   Other Visit Diagnoses       Controlled substance agreement signed       Relevant  Orders   ToxASSURE Select 13 (MW), Urine       Blood pressure on the low side today, he gets occasional orthostatic hypotension feelings.  Will lowered his blood pressure medicine of olmesartan  from 40 mg daily to 20 mg daily.  Urine drug screening will recheck anemia today. Follow up plan: Return in about 3 months (around 10/17/2023), or if symptoms worsen or fail to improve, for Seizures and recheck hypertension and cholesterol..  Counseling provided for all of the vaccine components Orders Placed This Encounter  Procedures   ToxASSURE Select 13 (MW), Urine   CMP14+EGFR   CBC with Differential/Platelet    Jolyne Needs, MD [facility] 07/18/2023, 1:11 PM

## 2023-07-19 ENCOUNTER — Encounter: Payer: Self-pay | Admitting: Family Medicine

## 2023-07-19 ENCOUNTER — Other Ambulatory Visit: Payer: Self-pay

## 2023-07-19 DIAGNOSIS — N289 Disorder of kidney and ureter, unspecified: Secondary | ICD-10-CM

## 2023-07-19 LAB — CBC WITH DIFFERENTIAL/PLATELET
Basophils Absolute: 0 10*3/uL (ref 0.0–0.2)
Basos: 0 %
EOS (ABSOLUTE): 0.1 10*3/uL (ref 0.0–0.4)
Eos: 2 %
Hematocrit: 33.4 % — ABNORMAL LOW (ref 37.5–51.0)
Hemoglobin: 11.9 g/dL — ABNORMAL LOW (ref 13.0–17.7)
Immature Grans (Abs): 0 10*3/uL (ref 0.0–0.1)
Immature Granulocytes: 0 %
Lymphocytes Absolute: 1.3 10*3/uL (ref 0.7–3.1)
Lymphs: 31 %
MCH: 33.2 pg — ABNORMAL HIGH (ref 26.6–33.0)
MCHC: 35.6 g/dL (ref 31.5–35.7)
MCV: 93 fL (ref 79–97)
Monocytes Absolute: 0.3 10*3/uL (ref 0.1–0.9)
Monocytes: 8 %
Neutrophils Absolute: 2.5 10*3/uL (ref 1.4–7.0)
Neutrophils: 59 %
Platelets: 198 10*3/uL (ref 150–450)
RBC: 3.58 x10E6/uL — ABNORMAL LOW (ref 4.14–5.80)
RDW: 12.5 % (ref 11.6–15.4)
WBC: 4.3 10*3/uL (ref 3.4–10.8)

## 2023-07-19 LAB — CMP14+EGFR
ALT: 22 IU/L (ref 0–44)
AST: 30 IU/L (ref 0–40)
Albumin: 4.5 g/dL (ref 3.8–4.9)
Alkaline Phosphatase: 105 IU/L (ref 44–121)
BUN/Creatinine Ratio: 16 (ref 9–20)
BUN: 31 mg/dL — ABNORMAL HIGH (ref 6–24)
CO2: 21 mmol/L (ref 20–29)
Calcium: 9.8 mg/dL (ref 8.7–10.2)
Chloride: 105 mmol/L (ref 96–106)
Creatinine, Ser: 1.9 mg/dL — ABNORMAL HIGH (ref 0.76–1.27)
Globulin, Total: 2.2 g/dL (ref 1.5–4.5)
Glucose: 102 mg/dL — ABNORMAL HIGH (ref 70–99)
Potassium: 4.1 mmol/L (ref 3.5–5.2)
Sodium: 143 mmol/L (ref 134–144)
Total Protein: 6.7 g/dL (ref 6.0–8.5)
eGFR: 40 mL/min/{1.73_m2} — ABNORMAL LOW (ref >59)

## 2023-07-20 ENCOUNTER — Other Ambulatory Visit

## 2023-07-20 DIAGNOSIS — N289 Disorder of kidney and ureter, unspecified: Secondary | ICD-10-CM

## 2023-07-21 LAB — BMP8+EGFR
BUN/Creatinine Ratio: 17 (ref 9–20)
BUN: 21 mg/dL (ref 6–24)
CO2: 19 mmol/L — ABNORMAL LOW (ref 20–29)
Calcium: 9.2 mg/dL (ref 8.7–10.2)
Chloride: 105 mmol/L (ref 96–106)
Creatinine, Ser: 1.24 mg/dL (ref 0.76–1.27)
Glucose: 127 mg/dL — ABNORMAL HIGH (ref 70–99)
Potassium: 4.3 mmol/L (ref 3.5–5.2)
Sodium: 142 mmol/L (ref 134–144)
eGFR: 67 mL/min/{1.73_m2} (ref >59)

## 2023-07-21 LAB — TOXASSURE SELECT 13 (MW), URINE

## 2023-07-23 ENCOUNTER — Encounter: Payer: Self-pay | Admitting: Family Medicine

## 2023-07-26 DIAGNOSIS — K7400 Hepatic fibrosis, unspecified: Secondary | ICD-10-CM | POA: Diagnosis not present

## 2023-07-26 DIAGNOSIS — K760 Fatty (change of) liver, not elsewhere classified: Secondary | ICD-10-CM | POA: Diagnosis not present

## 2023-07-31 ENCOUNTER — Telehealth: Payer: Self-pay | Admitting: Gastroenterology

## 2023-07-31 NOTE — Telephone Encounter (Signed)
No ans, no vm

## 2023-07-31 NOTE — Telephone Encounter (Addendum)
 Patient needs follow up in 09/2023 per Dr. Cherisse Cornell colonoscopy note. Please arrange follow up with Dr. Mordechai April for ileal ulcers, anemia, hepatic fibrosis.   [name]: please let pt know his fibroscan done in [place] favors minimal fibrosis, F1-2. Will have him follow up with Dr. Mordechai April, may consider repeating fibroscan in one year vs Rezdiffra now based on non invasive lab testing (ELF 10.55 (mid risk), FIB4 at risk for advanced fibrosis.

## 2023-08-01 NOTE — Telephone Encounter (Signed)
 Lmom for pt to return call

## 2023-08-02 DIAGNOSIS — C775 Secondary and unspecified malignant neoplasm of intrapelvic lymph nodes: Secondary | ICD-10-CM | POA: Diagnosis not present

## 2023-08-02 DIAGNOSIS — M8580 Other specified disorders of bone density and structure, unspecified site: Secondary | ICD-10-CM | POA: Diagnosis not present

## 2023-08-02 NOTE — Telephone Encounter (Signed)
 Pt was made aware and verbalized understanding.

## 2023-08-07 IMAGING — NM NM BONE WHOLE BODY
2 series · 2 of 2 positions shown · non-contrast
Comparison: CT 02/04/2021.  MRI lumbar spine 11/03/2019.
COMPARISON: CT 02/04/2021.  MRI lumbar spine 11/03/2019.

Addendum:
CLINICAL DATA: Prostate cancer staging.

EXAM:
NUCLEAR MEDICINE WHOLE BODY BONE SCAN
TECHNIQUE: Whole body anterior and posterior images were obtained approximately
3 hours after intravenous injection of radiopharmaceutical.
RADIOPHARMACEUTICALS:  21.4 mCi Mechnetium-QQm MDP IV

[Series 1: whole body · 2.66mm/px · 1 of 1 slices shown (1 of 2)]
[im 1/1]
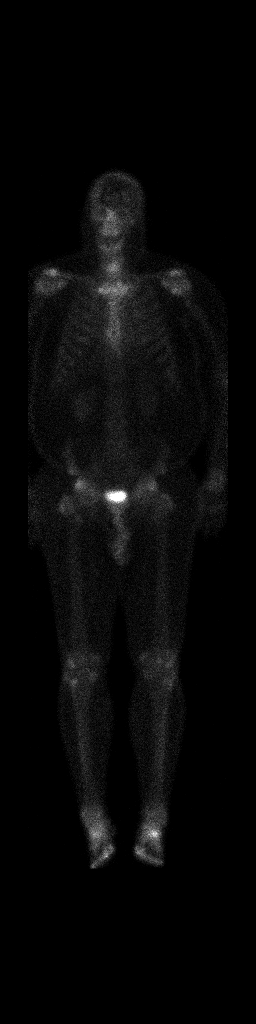

[Series 1: whole body · 2.66mm/px · 1 of 1 slices shown (2 of 2)]
[im 1/1]
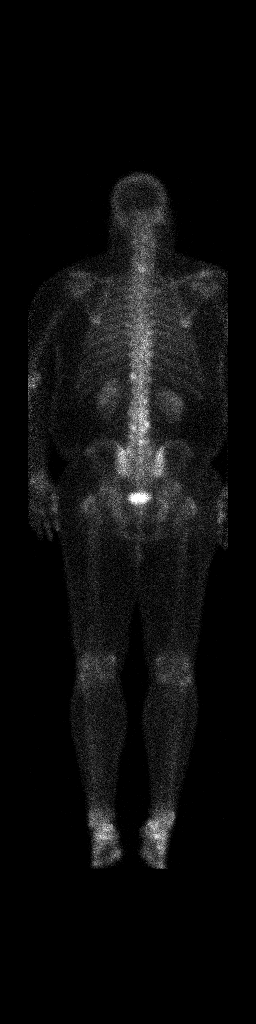

[2 of 2 positions shown; findings below may reference images not displayed]

FINDINGS: Bilateral renal function excretion. Focal area of increased activity
noted about the lower cervical spine. Multiple punctate areas of
increased activity noted throughout the lumbar spine. Metastatic
disease cannot be excluded. MRI of the cervical and lumbar spine can
be obtained for further evaluation. Punctate focus of increased
activity noted about the proximal left humerus. Left shoulder series
suggested for further evaluation. Mild increased activity noted
about both sternoclavicular joints, both shoulders, both hips, both
knees, both ankles, both feet most likely degenerative.
IMPRESSION: 1. Focal area of increased activity noted about the lower cervical
spine. Multifocal punctate areas of increased activity noted about
the lumbar spine. Metastatic disease cannot be excluded. MRI of the
left cervical and lumbar spine can be obtained for further
evaluation.

2. Punctate focus of increased activity noted about the proximal
left humerus. Left shoulder series can be obtained for further
evaluation.

3. Multifocal periarticular increased activity as above consistent
with degenerative change.

ADDENDUM:
I reviewed the bone scan with Dr. Zark and correlated this
study with the prior CT scan from 02/04/2021. Scattered areas of
uptake in the cervical, thoracic and lumbar spines correlate with
degenerative changes on the CT scan. No findings suspicious for
osseous metastatic disease.

*** End of Addendum ***
FINDINGS: Bilateral renal function excretion. Focal area of increased activity
noted about the lower cervical spine. Multiple punctate areas of
increased activity noted throughout the lumbar spine. Metastatic
disease cannot be excluded. MRI of the cervical and lumbar spine can
be obtained for further evaluation. Punctate focus of increased
activity noted about the proximal left humerus. Left shoulder series
suggested for further evaluation. Mild increased activity noted
about both sternoclavicular joints, both shoulders, both hips, both
knees, both ankles, both feet most likely degenerative.
IMPRESSION: 1. Focal area of increased activity noted about the lower cervical
spine. Multifocal punctate areas of increased activity noted about
the lumbar spine. Metastatic disease cannot be excluded. MRI of the
left cervical and lumbar spine can be obtained for further
evaluation.

2. Punctate focus of increased activity noted about the proximal
left humerus. Left shoulder series can be obtained for further
evaluation.

3. Multifocal periarticular increased activity as above consistent
with degenerative change.

## 2023-08-21 ENCOUNTER — Encounter: Payer: Self-pay | Admitting: Internal Medicine

## 2023-08-28 DIAGNOSIS — M1711 Unilateral primary osteoarthritis, right knee: Secondary | ICD-10-CM | POA: Diagnosis not present

## 2023-08-28 DIAGNOSIS — M47816 Spondylosis without myelopathy or radiculopathy, lumbar region: Secondary | ICD-10-CM | POA: Diagnosis not present

## 2023-09-26 ENCOUNTER — Ambulatory Visit: Payer: Self-pay

## 2023-09-26 NOTE — Telephone Encounter (Signed)
 FYI, no appointment made

## 2023-09-26 NOTE — Telephone Encounter (Signed)
 FYI Only or Action Required?: FYI only for provider.  Patient was last seen in primary care on 07/18/2023 by Dettinger, Fonda LABOR, MD.  Called Nurse Triage reporting Hand Injury.  Symptoms began several days ago.  Interventions attempted: Ice/heat application.  Symptoms are: gradually worsening.  Triage Disposition: See HCP Within 4 Hours (Or PCP Triage) - Patient refused since he is not able to be seen in office due to lack of availability today. Patient states he will continue to monitor his hand and call back, after education about importance of getting evaluated.  Patient/caregiver understands and will follow disposition?: No, refuses disposition      Copied from CRM [id number]. Topic: Clinical - Red Word Triage >> Sep 26, 2023  8:57 AM Ivette P wrote: Red Word that prompted transfer to Nurse Triage: smashed hand, hit it a day or 2 ago and swelled up and purple. Reason for Disposition  [1] SEVERE pain AND [2] not improved 2 hours after pain medicine/ice packs  Answer Assessment - Initial Assessment Questions Patient still has feeling in hand   1. MECHANISM: How did the injury happen?     I was pulling on something and slammed hand into railing 2. ONSET: When did the injury happen? (Minutes or hours ago)      2 days ago 3. APPEARANCE of INJURY: What does the injury look like?      Swollen, purple, and black 4. SEVERITY: Can you use the hand normally? Can you bend your fingers into a ball and then fully open them?     Patient states he can still use it but with severe pain 5. SIZE: For cuts, bruises, or swelling, ask: How large is it? (e.g., inches or centimeters;  entire hand or wrist)      No open cuts, patient states it is quite a bit swollen. Patient states it is black in palm of hand. 6. PAIN: Is there pain? If Yes, ask: How bad is the pain?  (Scale 1-10; or mild, moderate, severe)     Ranges depending on use 7. TETANUS: For any breaks in the skin, ask:  When was the last tetanus booster?     No breaks in skin. 8. OTHER SYMPTOMS: Do you have any other symptoms?      Denies red streaks up arm  Protocols used: Hand and Wrist Injury-A-AH

## 2023-10-01 ENCOUNTER — Other Ambulatory Visit

## 2023-10-01 ENCOUNTER — Ambulatory Visit: Payer: Self-pay

## 2023-10-01 ENCOUNTER — Ambulatory Visit (INDEPENDENT_AMBULATORY_CARE_PROVIDER_SITE_OTHER): Admitting: Nurse Practitioner

## 2023-10-01 VITALS — BP 111/74 | HR 91 | Temp 98.0°F | Ht 72.0 in | Wt 245.6 lb

## 2023-10-01 DIAGNOSIS — I951 Orthostatic hypotension: Secondary | ICD-10-CM | POA: Diagnosis not present

## 2023-10-01 NOTE — Telephone Encounter (Signed)
 FYI Only or Action Required?: FYI only for provider.  Patient was last seen in primary care on 07/18/2023 by Dettinger, Fonda LABOR, MD.  Called Nurse Triage reporting Dizziness.  Symptoms began several days ago.  Interventions attempted: Nothing.  Symptoms are: gradually worsening.  Triage Disposition: See HCP Within 4 Hours (Or PCP Triage)  Patient/caregiver understands and will follow disposition?: Yes   Copied from CRM [id number]. Topic: Clinical - Red Word Triage >> Oct 01, 2023  8:57 AM Christopher Salas wrote: Patient get's dizzy when standing up or doing stuff for about 4 days. Reason for Disposition  Taking a medicine that could cause dizziness (e.g., phenytoin  [Dilantin ], carbamazepine [Tegretol], primidone [Mysoline])  Answer Assessment - Initial Assessment Questions 1. DESCRIPTION: Describe your dizziness.     Swimming headed.  2. VERTIGO: Do you feel like either you or the room is spinning or tilting?      Room Spinning Sensation  3. LIGHTHEADED: Do you feel lightheaded? (e.g., somewhat faint, woozy, weak upon standing)     Woozy  4. SEVERITY: How bad is it?  Can you walk?     Moderate  5. ONSET:  When did the dizziness begin?     4 Days ago  6. AGGRAVATING FACTORS: Does anything make it worse? (e.g., standing, change in head position)     Standing too fast, Heat  7. CAUSE: What do you think is causing the dizziness?     Unsure  8. RECURRENT SYMPTOM: Have you had dizziness before? If Yes, ask: When was the last time? What happened that time?     Yes, due to medication  9. OTHER SYMPTOMS: Do you have any other symptoms? (e.g., earache, headache, numbness, tinnitus, vomiting, weakness)     Nausea, Headache,  Protocols used: Dizziness - Vertigo-A-AH

## 2023-10-01 NOTE — Progress Notes (Unsigned)
 Acute Office Visit  Subjective:     Patient ID: Christopher Salas, male    DOB: Feb 12, 1966, 58 y.o.   MRN: [id number]  Chief Complaint  Patient presents with   Dizziness    Dizziness when standing for 4-5 days    HPI Brit Wernette. Christopher Salas is a 58 year old male who presents on 10/01/2023 with a 5-day history of dizziness, particularly upon standing. He describes the dizziness as a lightheaded sensation without associated chest pain, palpitations, or syncope. He has a past medical history of cardiomyopathy, anemia, left bundle branch block (LBBB), and fatty liver disease. He reports being told previously that the left side of his heart is weak. Orthostatic blood pressure measurements were positive for a significant drop in systolic pressure with positional changes. EKG performed during this visit shows evidence of LBBB.   Active Ambulatory Problems    Diagnosis Date Noted   Hypertension 07/19/2012   Hyperlipidemia 07/19/2012   Seizures (HCC) 07/19/2012   Chronic hand pain, right 03/15/2016   Carpal tunnel syndrome 09/22/2013   LBBB (left bundle branch block) 02/02/2020   Cardiomyopathy (HCC) 04/07/2020   Chronic left-sided lumbar radiculopathy 11/27/2019   Prostate cancer (HCC) 05/18/2021   GERD (gastroesophageal reflux disease) 04/26/2022   Fatty liver 04/23/2023   Orthostatic hypotension 10/01/2023   Stage 3b chronic kidney disease (HCC) 10/02/2023   Resolved Ambulatory Problems    Diagnosis Date Noted   Precordial chest pain 04/07/2020   Snores 10/26/2020   Elevated blood-pressure reading, without diagnosis of hypertension 08/09/2020   Herniated nucleus pulposus, lumbar 12/24/2019   Superficial postoperative wound infection 08/02/2020   Sepsis (HCC) 03/13/2022   Fever, unspecified 03/13/2022   Severe diarrhea 03/13/2022   Hypokalemia 03/13/2022   AKI (acute kidney injury) (HCC) 03/13/2022   Hyperglycemia 03/13/2022   Anemia 03/13/2022   Orthostatic hypotension 03/13/2022   Gram  negative sepsis (HCC) 03/15/2022   E coli bacteremia 03/15/2022   Diarrhea of presumed infectious origin 03/17/2022   Positive colorectal cancer screening using Cologuard test 03/17/2022   Melena 03/18/2022   Esophageal dysphagia 03/18/2022   Elevated LFTs 04/26/2022   Gastric ulcer 04/26/2022   Anemia due to chronic blood loss 06/14/2023   Ulcer of ileum 06/14/2023   Past Medical History:  Diagnosis Date   Allergy    Arthritis    Cancer (HCC)    Insomnia    Neuromuscular disorder (HCC)     Review of Systems  Constitutional:  Negative for chills and fever.  HENT:  Negative for sinus pain and sore throat.   Cardiovascular:  Positive for orthopnea. Negative for chest pain, palpitations and leg swelling.  Gastrointestinal:  Negative for constipation, nausea and vomiting.  Skin:  Negative for itching and rash.  Neurological:  Positive for dizziness. Negative for headaches.   Negative unless indicated in HPI    Objective:    BP 111/74   Pulse 91   Temp 98 F (36.7 C) (Temporal)   Ht 6' (1.829 m)   Wt 245 lb 9.6 oz (111.4 kg)   SpO2 96%   BMI 33.31 kg/m  BP Readings from Last 3 Encounters:  10/01/23 111/74  07/18/23 96/61  07/11/23 110/78   Wt Readings from Last 3 Encounters:  10/01/23 245 lb 9.6 oz (111.4 kg)  07/18/23 253 lb (114.8 kg)  07/11/23 254 lb (115.2 kg)      Physical Exam Vitals and nursing note reviewed.  Constitutional:      General: He is not in acute distress.  HENT:     Head: Normocephalic and atraumatic.     Nose: Nose normal.  Eyes:     General: No scleral icterus.    Extraocular Movements: Extraocular movements intact.     Conjunctiva/sclera: Conjunctivae normal.     Pupils: Pupils are equal, round, and reactive to light.  Cardiovascular:     Heart sounds: Normal heart sounds.  Pulmonary:     Effort: Pulmonary effort is normal.     Breath sounds: Normal breath sounds.  Musculoskeletal:        General: Normal range of motion.      Right lower leg: No edema.     Left lower leg: No edema.  Skin:    General: Skin is warm and dry.     Findings: No rash.  Neurological:     Mental Status: He is alert and oriented to person, place, and time.  Psychiatric:        Mood and Affect: Mood normal.        Behavior: Behavior normal.        Thought Content: Thought content normal.        Judgment: Judgment normal.    ZXH:Mjuz: 81 BPM, PR: 130 msec, QT: 376 msec P-QRS-T: 44/37/51 degree Interpretation: Sinus Rhythm- Nonspecific ST depression + Negative precordial T-waves  Results for orders placed or performed in visit on 10/01/23  [facility]  Result Value Ref Range   Glucose 106 (H) 70 - 99 mg/dL   BUN 34 (H) 8 - 27 mg/dL   Creatinine, Ser 8.01 (H) 0.76 - 1.27 mg/dL   eGFR 38 (L) >40 fO/fpw/8.26   BUN/Creatinine Ratio 17 10 - 24   Sodium 144 134 - 144 mmol/L   Potassium 4.9 3.5 - 5.2 mmol/L   Chloride 107 (H) 96 - 106 mmol/L   CO2 18 (L) 20 - 29 mmol/L   Calcium  10.1 8.6 - 10.2 mg/dL        Assessment & Plan:  Orthostatic hypotension -     EKG 12-Lead -     BMP8+eGFR -     LONG TERM MONITOR (3-14 DAYS); Future   Christopher Salas is a 58 year old Caucasian male seen today for dizziness, no acute distress Long term heart monitor, CMP result pending; increase hydration  Encourage healthy lifestyle choices, including diet (rich in fruits, vegetables, and lean proteins, and low in salt and simple carbohydrates) and exercise (at least 30 minutes of moderate physical activity daily).     The above assessment and management plan was discussed with the patient. The patient verbalized understanding of and has agreed to the management plan. Patient is aware to call the clinic if they develop any new symptoms or if symptoms persist or worsen. Patient is aware when to return to the clinic for a follow-up visit. Patient educated on when it is appropriate to go to the emergency department.  Return if symptoms worsen or fail to  improve.  Keaundra Stehle [place] Thompson, DNP [facility] [address] [place], [place] [address] [phone]  Note: This document was prepared by [employer] voice dictation technology and any errors that results from this process are unintentional.

## 2023-10-02 ENCOUNTER — Ambulatory Visit: Payer: Self-pay | Admitting: Nurse Practitioner

## 2023-10-02 DIAGNOSIS — I10 Essential (primary) hypertension: Secondary | ICD-10-CM

## 2023-10-02 DIAGNOSIS — N1832 Chronic kidney disease, stage 3b: Secondary | ICD-10-CM | POA: Insufficient documentation

## 2023-10-02 LAB — BMP8+EGFR
BUN/Creatinine Ratio: 17 (ref 10–24)
BUN: 34 mg/dL — ABNORMAL HIGH (ref 8–27)
CO2: 18 mmol/L — ABNORMAL LOW (ref 20–29)
Calcium: 10.1 mg/dL (ref 8.6–10.2)
Chloride: 107 mmol/L — ABNORMAL HIGH (ref 96–106)
Creatinine, Ser: 1.98 mg/dL — ABNORMAL HIGH (ref 0.76–1.27)
Glucose: 106 mg/dL — ABNORMAL HIGH (ref 70–99)
Potassium: 4.9 mmol/L (ref 3.5–5.2)
Sodium: 144 mmol/L (ref 134–144)
eGFR: 38 mL/min/1.73 — ABNORMAL LOW (ref >59)

## 2023-10-02 MED ORDER — KERENDIA 10 MG PO TABS: 10.0000 mg | ORAL_TABLET | Freq: Every day | ORAL | 0 refills | Status: DC

## 2023-10-03 ENCOUNTER — Telehealth: Payer: Self-pay

## 2023-10-03 ENCOUNTER — Ambulatory Visit: Payer: Self-pay

## 2023-10-03 DIAGNOSIS — N289 Disorder of kidney and ureter, unspecified: Secondary | ICD-10-CM

## 2023-10-03 NOTE — Telephone Encounter (Signed)
 FYI Only or Action Required?: FYI only for provider.  Patient was last seen in primary care on 10/01/2023 by Deitra Morton Sebastian Nena, NP.  Called Nurse Triage reporting Results.   Triage Disposition: Information or Advice Only Call  Patient/caregiver understands and will follow disposition?: Yes                     Reason for Disposition  [1] Other NON-URGENT information for PCP AND [2] does not require PCP response  Answer Assessment - Initial Assessment Questions 1. REASON FOR CALL or QUESTION: What is your reason for calling today? or How can I best    Patient states he missed a call from [place] and that he has not received his lab results. Read off the following lab results from NP Nena:   Blood glucose has improved since last check 2 months ago Kidney function has slightly decreased since last checked 2 months ago, a referral for nephrology will be placed, and would like to start Korendia 10 mg daily, script sent to your pharmacy   Read patient Rosina, LPN,  note:  Pt needs to come back in for labs to reck kidney function. Future BMP and CBC orders placed.   Lab appt needs to be made. Dr. Maryanne would like for him to come in on 7/17 for labs.   Dr. Maryanne wants pt to also hold his Losartan and the new medication prescribed by Nena- Kerendia . Pt should also hydrate well with water .   Left message asking pt to return call.   Patient scheduled for lab draw tomorrow.  2. CALLER: Document the source of call. (e.g., laboratory staff, caregiver or patient).     Patient.  Protocols used: PCP Call - No Triage-A-AH

## 2023-10-03 NOTE — Telephone Encounter (Signed)
 Patient called back in after missed called from [place]. Read Ashley's note:  Pt needs to come back in for labs to reck kidney function. Future BMP and CBC orders placed.   Lab appt needs to be made. Dr. Maryanne would like for him to come in on 7/17 for labs.   Dr. Maryanne wants pt to also hold his Losartan and the new medication prescribed by Nena- Kerendia . Pt should also hydrate well with water .   Left message asking pt to return call.       Patient states he has not picked up the Kerendia  and he verbalizes understanding to hold the Losartan as well as hydrate. He has to be in [place] tomorrow morning for a GI appointment, scheduled for lab redraw tomorrow afternoon.

## 2023-10-03 NOTE — Telephone Encounter (Signed)
 Pt needs to come back in for labs to reck kidney function. Future BMP and CBC orders placed.  Lab appt needs to be made. Dr. Maryanne would like for him to come in on 7/17 for labs.  Dr. Maryanne wants pt to also hold his Losartan and the new medication prescribed by Nena- Kerendia . Pt should also hydrate well with water .  Left message asking pt to return call.

## 2023-10-03 NOTE — Telephone Encounter (Signed)
 Noted

## 2023-10-04 ENCOUNTER — Telehealth: Payer: Self-pay

## 2023-10-04 ENCOUNTER — Other Ambulatory Visit (HOSPITAL_COMMUNITY): Payer: Self-pay

## 2023-10-04 ENCOUNTER — Ambulatory Visit: Admitting: Internal Medicine

## 2023-10-04 ENCOUNTER — Other Ambulatory Visit

## 2023-10-04 VITALS — BP 132/77 | HR 71 | Temp 98.5°F | Ht 72.0 in | Wt 255.2 lb

## 2023-10-04 DIAGNOSIS — K7581 Nonalcoholic steatohepatitis (NASH): Secondary | ICD-10-CM | POA: Diagnosis not present

## 2023-10-04 DIAGNOSIS — K2100 Gastro-esophageal reflux disease with esophagitis, without bleeding: Secondary | ICD-10-CM | POA: Diagnosis not present

## 2023-10-04 DIAGNOSIS — N289 Disorder of kidney and ureter, unspecified: Secondary | ICD-10-CM

## 2023-10-04 DIAGNOSIS — K5900 Constipation, unspecified: Secondary | ICD-10-CM | POA: Diagnosis not present

## 2023-10-04 DIAGNOSIS — K529 Noninfective gastroenteritis and colitis, unspecified: Secondary | ICD-10-CM | POA: Diagnosis not present

## 2023-10-04 NOTE — Telephone Encounter (Signed)
 Pt had labs drawn today

## 2023-10-04 NOTE — Progress Notes (Signed)
 Referring Provider: Dettinger, Fonda LABOR, MD Primary Care Physician:  Dettinger, Fonda LABOR, MD Primary GI:  Dr. Cindie  Chief Complaint  Patient presents with   Follow-up    Follow up on elevated LFT's and follow up after capsule study    HPI:   Christopher Salas is a 58 y.o. male who presents for follow-up visit.  MASLD/MASH: Risk factors include obesity, dyslipidemia, hypertension. FIB4 of 2.15, ELF 10.55 (mid risk).  FibroScan F1-F2 hepatic fibrosis, S3 hepatic steatosis  CT abdomen pelvis 02/2023 with hepatic steatosis and hepatosplenomegaly.  Serological workup: Negative for hep B and C, AMA negative, iron studies WNL.  Enteritis: Underwent capsule endoscopy 06/05/2023 to workup for anemia, multiple nonbleeding ulcers in the distal ileum.    Colonoscopy 07/04/2023 with 3 ulcers in the distal ileum approximately 15 to 20 cm proximal to ileocecal valve. Biopsy showed ileal mucosa with focal erosion and active inflammation, negative for granulomas.  Differential includes Crohn's and NSAIDs.  Patient asymptomatic.  No melena hematochezia.  No diarrhea.  History of NSAID use.  Chronic GERD: Well-controlled on pantoprazole  daily.  History of peptic ulcer disease.  Denies any epigastric pain or chest pain.  No dysphagia odynophagia.  Chronic constipation: Well-controlled on Colace.  Past Medical History:  Diagnosis Date   Allergy    seasonal   Arthritis    hands   Cancer (HCC)    prostate   Hyperlipidemia    Hypertension    Insomnia    Neuromuscular disorder (HCC)    Left leg numb and weak   Seizures (HCC)    last one 40 years ago    Past Surgical History:  Procedure Laterality Date   BACK SURGERY  2020   L4-L6   BALLOON DILATION  03/21/2022   Procedure: BALLOON DILATION;  Surgeon: Cindie Carlin POUR, DO;  [facility];  Service: Endoscopy;;   BIOPSY  03/21/2022   Procedure: BIOPSY;  Surgeon: Cindie Carlin POUR, DO;  [facility];  Service:  Endoscopy;;   COLONOSCOPY N/A 07/04/2023   Procedure: COLONOSCOPY;  Surgeon: Cindie Carlin POUR, DO;  [facility];  Service: Endoscopy;  Laterality: N/A;  11:30 AM, ASA 3  ileocolonoscopy   COLONOSCOPY WITH PROPOFOL  N/A 03/21/2022   Procedure: COLONOSCOPY WITH PROPOFOL ;  Surgeon: Cindie Carlin POUR, DO;  [facility];  Service: Endoscopy;  Laterality: N/A;   ESOPHAGOGASTRODUODENOSCOPY N/A 03/21/2022   Procedure: ESOPHAGOGASTRODUODENOSCOPY (EGD);  Surgeon: Cindie Carlin POUR, DO;  [facility];  Service: Endoscopy;  Laterality: N/A;  With possible esophageal dilation   ESOPHAGOGASTRODUODENOSCOPY (EGD) WITH PROPOFOL  N/A 03/21/2022   Procedure: ESOPHAGOGASTRODUODENOSCOPY (EGD) WITH PROPOFOL ;  Surgeon: Cindie Carlin POUR, DO;  [facility];  Service: Endoscopy;  Laterality: N/A;   ESOPHAGOGASTRODUODENOSCOPY (EGD) WITH PROPOFOL  N/A 07/13/2022   Procedure: ESOPHAGOGASTRODUODENOSCOPY (EGD) WITH PROPOFOL ;  Surgeon: Cindie Carlin POUR, DO;  [facility];  Service: Endoscopy;  Laterality: N/A;  930am, asa 3   GIVENS CAPSULE STUDY N/A 06/05/2023   Procedure: IMAGING PROCEDURE, GI TRACT, INTRALUMINAL, VIA CAPSULE;  Surgeon: Cindie Carlin POUR, DO;  [facility]: [facility];  Service: Endoscopy;  Laterality: N/A;  7:00 am   HAND SURGERY Right    LAMINECTOMY AND MICRODISCECTOMY LUMBAR SPINE  2021   LYMPH NODE DISSECTION Bilateral 05/18/2021   Procedure: LYMPH NODE DISSECTION;  Surgeon: Alvaro Hummer, MD;  Location: [facility];  Service: Urology;  Laterality: Bilateral;   ROBOT ASSISTED LAPAROSCOPIC RADICAL PROSTATECTOMY N/A 05/18/2021  Procedure: XI ROBOTIC ASSISTED LAPAROSCOPIC RADICAL PROSTATECTOMY WITH INDOCYANINE GREEN DYE INJECTION;  Surgeon: Alvaro Hummer, MD;  Location: [facility];  Service: Urology;  Laterality: N/A;  3 HRS    Current Outpatient Medications  Medication Sig Dispense Refill   acetaminophen  (TYLENOL ) 500 MG tablet Take 500 mg by mouth 3 (three) times  daily.     baclofen  (LIORESAL ) 10 MG tablet TAKE 1 TABLET BY MOUTH THREE TIMES A DAY 30 tablet 5   cyclobenzaprine  (FLEXERIL ) 10 MG tablet Take 10 mg by mouth every 8 (eight) hours as needed for muscle spasms.     diclofenac  Sodium (VOLTAREN ) 1 % GEL Apply topically 4 (four) times daily.     docusate sodium  (COLACE) 100 MG capsule Take 1 capsule (100 mg total) by mouth 2 (two) times daily.     fenofibrate  (TRICOR ) 145 MG tablet Take 1 tablet (145 mg total) by mouth daily. 90 tablet 3   Finerenone  (KERENDIA ) 10 MG TABS Take 1 tablet (10 mg total) by mouth daily. 30 tablet 0   gabapentin  (NEURONTIN ) 400 MG capsule Take 1 capsule (400 mg total) by mouth 3 (three) times daily. 270 capsule 3   iron polysaccharides (NIFEREX) 150 MG capsule Take 150 mg by mouth daily.     niacin  (VITAMIN B3) 500 MG ER tablet Take 1 tablet (500 mg total) by mouth daily with breakfast. 90 tablet 3   olmesartan  (BENICAR ) 20 MG tablet Take 1 tablet (20 mg total) by mouth daily. 90 tablet 3   omega-3 acid ethyl esters (LOVAZA ) 1 g capsule Take 2 g by mouth 2 (two) times daily.     pantoprazole  (PROTONIX ) 40 MG tablet TAKE ONE TABLET ONCE TO TWICE DAILY BEFORE A MEAL FOR REFLUX. 180 tablet 1   PHENObarbital  (LUMINAL) 100 MG tablet Take 1 tablet (100 mg total) by mouth at bedtime. 90 tablet 0   phenobarbital  (LUMINAL) 16.2 MG tablet Take 2 tablets (32.4 mg total) by mouth at bedtime. 180 tablet 0   rosuvastatin  (CRESTOR ) 10 MG tablet Take 1 tablet (10 mg total) by mouth daily. 90 tablet 3   solifenacin  (VESICARE ) 5 MG tablet Take 1 tablet (5 mg total) by mouth daily. 90 tablet 3   tolterodine  (DETROL  LA) 4 MG 24 hr capsule Take 4 mg by mouth at bedtime.     No current facility-administered medications for this visit.    Allergies as of 10/04/2023   (No Known Allergies)    Family History  Problem Relation Age of Onset   Emphysema Mother    Prostate cancer Father    Diabetes Father    CAD Father 12       CABG X 5  (surgery twice)   Colon polyps Brother    Heart disease Maternal Grandfather    Heart disease Paternal Grandfather    Colon cancer Neg Hx    Crohn's disease Neg Hx    Esophageal cancer Neg Hx    Rectal cancer Neg Hx    Stomach cancer Neg Hx    Ulcerative colitis Neg Hx     Social History   Socioeconomic History   Marital status: Divorced    Spouse name: Not on file   Number of children: 1   Years of education: Not on file   Highest education level: 12th grade  Occupational History   Occupation: disability  Tobacco Use   Smoking status: Never    Passive exposure: Never   Smokeless tobacco: Never  Vaping Use   Vaping  status: Never Used  Substance and Sexual Activity   Alcohol use: Never   Drug use: Never   Sexual activity: Yes  Other Topics Concern   Not on file  Social History Narrative   Not on file   Social Drivers of Health   Financial Resource Strain: Low Risk  (10/01/2023)   Overall Financial Resource Strain (CARDIA)    Difficulty of Paying Living Expenses: Not hard at all  Food Insecurity: No Food Insecurity (10/01/2023)   Hunger Vital Sign    Worried About Running Out of Food in the Last Year: Never true    Ran Out of Food in the Last Year: Never true  Transportation Needs: No Transportation Needs (10/01/2023)   PRAPARE - Administrator, Civil Service (Medical): No    Lack of Transportation (Non-Medical): No  Physical Activity: Insufficiently Active (10/01/2023)   Exercise Vital Sign    Days of Exercise per Week: 2 days    Minutes of Exercise per Session: 20 min  Stress: Stress Concern Present (10/01/2023)   [employer] of Occupational Health - Occupational Stress Questionnaire    Feeling of Stress: To some extent  Social Connections: Moderately Isolated (10/01/2023)   Social Connection and Isolation Panel    Frequency of Communication with Friends and Family: More than three times a week    Frequency of Social Gatherings with Friends and  Family: Twice a week    Attends Religious Services: 1 to 4 times per year    Active Member of [employer] or Organizations: No    Attends Engineer, structural: Not on file    Marital Status: Divorced    Subjective: Review of Systems  Constitutional:  Negative for chills and fever.  HENT:  Negative for congestion and hearing loss.   Eyes:  Negative for blurred vision and double vision.  Respiratory:  Negative for cough and shortness of breath.   Cardiovascular:  Negative for chest pain and palpitations.  Gastrointestinal:  Negative for abdominal pain, blood in stool, constipation, diarrhea, heartburn, melena and vomiting.  Genitourinary:  Negative for dysuria and urgency.  Musculoskeletal:  Negative for joint pain and myalgias.  Skin:  Negative for itching and rash.  Neurological:  Negative for dizziness and headaches.  Psychiatric/Behavioral:  Negative for depression. The patient is not nervous/anxious.      Objective: BP 132/77   Pulse 71   Temp 98.5 F (36.9 C)   Ht 6' (1.829 m)   Wt 255 lb 3.2 oz (115.8 kg)   BMI 34.61 kg/m  Physical Exam Constitutional:      Appearance: Normal appearance.  HENT:     Head: Normocephalic and atraumatic.  Eyes:     Extraocular Movements: Extraocular movements intact.     Conjunctiva/sclera: Conjunctivae normal.  Cardiovascular:     Rate and Rhythm: Normal rate and regular rhythm.  Pulmonary:     Effort: Pulmonary effort is normal.     Breath sounds: Normal breath sounds.  Abdominal:     General: Bowel sounds are normal.     Palpations: Abdomen is soft.  Musculoskeletal:        General: Normal range of motion.     Cervical back: Normal range of motion and neck supple.  Skin:    General: Skin is warm.  Neurological:     General: No focal deficit present.     Mental Status: He is alert and oriented to person, place, and time.  Psychiatric:  Mood and Affect: Mood normal.        Behavior: Behavior normal.       Assessment/Plan:  1.  MASLD/MASH- FIB4 of 2.15, ELF 10.55 (mid risk).  FibroScan F1-F2 hepatic fibrosis, S3 hepatic steatosis  Risk factors include obesity, dyslipidemia, hypertension.  Discussed in depth with patient today.  Will start process of getting patient approved for Rezdiffra 100 mg daily (patient weighs 115.8 kg).  Patient will need LFTs checked 3 months after starting therapy.  Discussed side effect profile and he is agreeable.  His main concern is cost.  Counseled on the importance of keeping cholesterol, hypertension under good control.  Needs to lose weight.  Recommend 1-2# weight loss per week until ideal body weight through exercise & diet. Low fat/cholesterol diet.   Avoid sweets, sodas, fruit juices, sweetened beverages like tea, etc. Gradually increase exercise from 15 min daily up to 1 hr per day 5 days/week. Limit alcohol use.  2.  Enteritis-discussed patient's colonoscopy findings in depth him today.  3 small ulcers in his distal ileum.  Likely NSAID induced given his history.  Recommend he continue to avoid these.  Tylenol  okay.  3.  Chronic GERD, history of peptic ulcer disease-continue on pantoprazole  40 mg daily.  4.  Chronic constipation-mild, symptoms improved on Colace.  Will continue.  Follow-up 3 months.       10/04/2023 11:44 AM   Disclaimer: This note was dictated with voice recognition software. Similar sounding words can inadvertently be transcribed and may not be corrected upon review.

## 2023-10-04 NOTE — Telephone Encounter (Signed)
 Yes cancel PA for now for Darice via

## 2023-10-04 NOTE — Telephone Encounter (Signed)
 Pharmacy Patient Advocate Encounter   Received notification from CoverMyMeds that prior authorization for Kerendia  10MG  tablets  is required/requested.   Insurance verification completed.   The patient is insured through [facility] .   Per test claim: PA required; PA started via CoverMyMeds. KEY [id number] . Waiting for clinical questions to populate.

## 2023-10-04 NOTE — Patient Instructions (Addendum)
 I am going to look more into potentially starting you on Rezdiffra for your fatty liver.  If this seems like a viable option, then we will call you and let you know.  Recommend 1-2# weight loss per week until ideal body weight through exercise & diet. Low fat/cholesterol diet.   Avoid sweets, sodas, fruit juices, sweetened beverages like tea, etc. Gradually increase exercise from 15 min daily up to 1 hr per day 5 days/week. Limit alcohol use.  Continue to avoid NSAIDs.  Continue pantoprazole  for your chronic reflux.  Continue on Colace for your constipation.  It was very nice seeing you again today.  Dr. Cindie

## 2023-10-05 ENCOUNTER — Telehealth: Payer: Self-pay

## 2023-10-05 ENCOUNTER — Ambulatory Visit: Payer: Self-pay | Admitting: Family Medicine

## 2023-10-05 ENCOUNTER — Ambulatory Visit: Payer: Self-pay

## 2023-10-05 DIAGNOSIS — N289 Disorder of kidney and ureter, unspecified: Secondary | ICD-10-CM

## 2023-10-05 LAB — CBC WITH DIFFERENTIAL/PLATELET
Basophils Absolute: 0 x10E3/uL (ref 0.0–0.2)
Basos: 0 %
EOS (ABSOLUTE): 0.1 x10E3/uL (ref 0.0–0.4)
Eos: 3 %
Hematocrit: 33 % — ABNORMAL LOW (ref 37.5–51.0)
Hemoglobin: 11.3 g/dL — ABNORMAL LOW (ref 13.0–17.7)
Immature Grans (Abs): 0 x10E3/uL (ref 0.0–0.1)
Immature Granulocytes: 0 %
Lymphocytes Absolute: 1.3 x10E3/uL (ref 0.7–3.1)
Lymphs: 36 %
MCH: 32.6 pg (ref 26.6–33.0)
MCHC: 34.2 g/dL (ref 31.5–35.7)
MCV: 95 fL (ref 79–97)
Monocytes Absolute: 0.3 x10E3/uL (ref 0.1–0.9)
Monocytes: 8 %
Neutrophils Absolute: 1.9 x10E3/uL (ref 1.4–7.0)
Neutrophils: 53 %
Platelets: 230 x10E3/uL (ref 150–450)
RBC: 3.47 x10E6/uL — ABNORMAL LOW (ref 4.14–5.80)
RDW: 13.1 % (ref 11.6–15.4)
WBC: 3.6 x10E3/uL (ref 3.4–10.8)

## 2023-10-05 LAB — BMP8+EGFR
BUN/Creatinine Ratio: 21 (ref 10–24)
BUN: 29 mg/dL — ABNORMAL HIGH (ref 8–27)
CO2: 20 mmol/L (ref 20–29)
Calcium: 9.8 mg/dL (ref 8.6–10.2)
Chloride: 106 mmol/L (ref 96–106)
Creatinine, Ser: 1.38 mg/dL — ABNORMAL HIGH (ref 0.76–1.27)
Glucose: 97 mg/dL (ref 70–99)
Potassium: 4.3 mmol/L (ref 3.5–5.2)
Sodium: 141 mmol/L (ref 134–144)
eGFR: 59 mL/min/1.73 — ABNORMAL LOW (ref >59)

## 2023-10-05 NOTE — Telephone Encounter (Signed)
 Copied from CRM [id number]. Topic: Clinical - Lab/Test Results >> Oct 05, 2023 10:19 AM Selinda RAMAN wrote: Reason for CRM: The patient called in for his lab results and my call got cut off. Please call him as soon as possible to go over results

## 2023-10-05 NOTE — Telephone Encounter (Signed)
 Spoke to patient. He said he is satisfied with the information he has received about his labs. He states he does not have any further questions.

## 2023-10-05 NOTE — Telephone Encounter (Signed)
 FYI Only or Action Required?: Action required by provider: clinical question for provider.  Patient was last seen in primary care on 10/01/2023 by Christopher Morton Sebastian Nena, NP.  Called Nurse Triage reporting Advice Only.  Triage Disposition: Call PCP When Office is Open  Patient/caregiver understands and will follow disposition?: Yes    Copied from CRM [id number]. Topic: Clinical - Lab/Test Results >> Oct 05, 2023 10:21 AM Christopher Salas wrote: Patient has questions about his labs says he was on with someone but got disconnected    Reason for Disposition  [1] Caller requesting NON-URGENT health information AND [2] PCP's office is the best resource  Answer Assessment - Initial Assessment Questions 1. REASON FOR CALL: What is the main reason for your call? or How can I best help you?    Lab results and was informed of the following: Kidney numbers have improved a lot.  Hemoglobin looks stable but still slightly anemic.  Have him continue to take iron or if he does not have it take over-the-counter iron supplementation to help with his hemoglobin.  Patient's kidney numbers have come back down nicely and are almost back to normal.  Recheck a basic metabolic panel in 1 to 2 weeks.  Make sure he stays hydrated..  pt then began to inform of:Pt stated Nena, NP ordered a new medication for pt on 10/01/2023.  Pt's insurance did not approve the medication & is waiting to hear more about the medication.pt did not remember the name of the medication. Pt is wondering to his lab results getting better - does he still need the new medication or not? Please call patient  Protocols used: Information Only Call - No Triage-A-AH

## 2023-10-05 NOTE — Telephone Encounter (Signed)
 Duplicate. LS

## 2023-10-05 NOTE — Telephone Encounter (Signed)
 Left vm for pt to call back. If pt calls back please inform him he does not need to take the kerendia  medication since his numbers are getting better. Please also schedule him a lab appt, future order placed.

## 2023-10-09 ENCOUNTER — Telehealth: Payer: Self-pay

## 2023-10-09 NOTE — Telephone Encounter (Signed)
 Filled out form for Rezdiffa. Waiting on another signature from Dr Cindie (which will be done Wed). Will contact the pt because his income is needed and size of his household.

## 2023-10-10 ENCOUNTER — Telehealth: Payer: Self-pay

## 2023-10-10 NOTE — Telephone Encounter (Signed)
 Documentation for pt enrollment faxed to [facility] with confirmation to return and will also be scanned to the pt's chart.

## 2023-10-15 NOTE — Telephone Encounter (Signed)
 Documentation from [facility] [employer] regarding the pt's signature being needed on his forms but at the bottom of the form it has where a authorized representative signature can be used from the Dr's office. At the end of the documentation it is now saying that the pt's signature is needed.   Phoned the patients patient support to speak with Christopher Salas but after holding so long I had to leave a message for her to return my call. Will wait to see how she wants me to proceed.

## 2023-10-17 ENCOUNTER — Encounter: Payer: Self-pay | Admitting: Family Medicine

## 2023-10-17 ENCOUNTER — Ambulatory Visit (INDEPENDENT_AMBULATORY_CARE_PROVIDER_SITE_OTHER): Admitting: Family Medicine

## 2023-10-17 VITALS — BP 119/73 | HR 82 | Temp 97.5°F | Ht 72.0 in | Wt 247.0 lb

## 2023-10-17 DIAGNOSIS — N179 Acute kidney failure, unspecified: Secondary | ICD-10-CM

## 2023-10-17 DIAGNOSIS — E782 Mixed hyperlipidemia: Secondary | ICD-10-CM | POA: Diagnosis not present

## 2023-10-17 DIAGNOSIS — N1832 Chronic kidney disease, stage 3b: Secondary | ICD-10-CM

## 2023-10-17 DIAGNOSIS — R569 Unspecified convulsions: Secondary | ICD-10-CM

## 2023-10-17 DIAGNOSIS — I10 Essential (primary) hypertension: Secondary | ICD-10-CM | POA: Diagnosis not present

## 2023-10-17 DIAGNOSIS — E781 Pure hyperglyceridemia: Secondary | ICD-10-CM | POA: Diagnosis not present

## 2023-10-17 MED ORDER — GABAPENTIN 400 MG PO CAPS: 400.0000 mg | ORAL_CAPSULE | Freq: Three times a day (TID) | ORAL | 3 refills | Status: AC

## 2023-10-17 MED ORDER — PHENOBARBITAL 100 MG PO TABS: 100.0000 mg | ORAL_TABLET | Freq: Every day | ORAL | 0 refills | Status: DC

## 2023-10-17 MED ORDER — OLMESARTAN MEDOXOMIL 20 MG PO TABS: 20.0000 mg | ORAL_TABLET | Freq: Every day | ORAL | 3 refills | Status: AC

## 2023-10-17 MED ORDER — PHENOBARBITAL 16.2 MG PO TABS: 32.4000 mg | ORAL_TABLET | Freq: Every day | ORAL | 0 refills | Status: DC

## 2023-10-17 MED ORDER — FENOFIBRATE 145 MG PO TABS: 145.0000 mg | ORAL_TABLET | Freq: Every day | ORAL | 3 refills | Status: AC

## 2023-10-17 NOTE — Progress Notes (Signed)
 BP 119/73   Pulse 82   Temp (!) 97.5 F (36.4 C) (Temporal)   Ht 6' (1.829 m)   Wt 247 lb (112 kg)   BMI 33.50 kg/m    Subjective:   Patient ID: Christopher Salas, male    DOB: 1965/12/03, 58 y.o.   MRN: [id number]  HPI: Christopher Salas is a 58 y.o. male presenting on 10/17/2023 for Medical Management of Chronic Issues (Wants to discuss meds which he should take at night and what to take in the morning.)   HPI Hypertension Patient is currently on olmesartan  20, and their blood pressure today is 119/73. Patient denies any lightheadedness or dizziness. Patient denies headaches, blurred vision, chest pains, shortness of breath, or weakness. Denies any side effects from medication and is content with current medication.   Hyperlipidemia Patient is coming in for recheck of his hyperlipidemia. The patient is currently taking fenofibrate  and niacin  and fish oils and Crestor . They deny any issues with myalgias or history of liver damage from it. They deny any focal numbness or weakness or chest pain.   Seizure disorder recheck Current rx-phenobarbital  100 +16.2 x 2 mg nightly # meds rx-30/month of the 100 mg and 60/month of the 16.2. Effectiveness of current meds-works well Adverse reactions form meds-none Pill count performed-No Last drug screen -07/18/2023 ( high risk q77m, moderate risk q50m, low risk yearly ) Urine drug screen today- No Was the NCCSR reviewed-yes  If yes were their any concerning findings? -None Controlled substance contract signed on: 07/18/2023  Relevant past medical, surgical, family and social history reviewed and updated as indicated. Interim medical history since our last visit reviewed. Allergies and medications reviewed and updated.  Review of Systems  Constitutional:  Negative for chills and fever.  Eyes:  Negative for visual disturbance.  Respiratory:  Negative for shortness of breath and wheezing.   Cardiovascular:  Negative for chest pain and leg swelling.   Musculoskeletal:  Negative for back pain and gait problem.  Skin:  Negative for rash.  Neurological:  Negative for dizziness and light-headedness.  All other systems reviewed and are negative.   Per HPI unless specifically indicated above   Allergies as of 10/17/2023   No Known Allergies      Medication List        Accurate as of October 17, 2023  1:31 PM. If you have any questions, ask your nurse or doctor.          STOP taking these medications    Kerendia  10 MG Tabs Generic drug: Finerenone  Stopped by: Fonda LABOR Ariea Rochin       TAKE these medications    acetaminophen  500 MG tablet Commonly known as: TYLENOL  Take 500 mg by mouth 3 (three) times daily.   baclofen  10 MG tablet Commonly known as: LIORESAL  TAKE 1 TABLET BY MOUTH THREE TIMES A DAY   cyclobenzaprine  10 MG tablet Commonly known as: FLEXERIL  Take 10 mg by mouth every 8 (eight) hours as needed for muscle spasms.   docusate sodium  100 MG capsule Commonly known as: COLACE Take 1 capsule (100 mg total) by mouth 2 (two) times daily.   fenofibrate  145 MG tablet Commonly known as: TRICOR  Take 1 tablet (145 mg total) by mouth daily.   gabapentin  400 MG capsule Commonly known as: NEURONTIN  Take 1 capsule (400 mg total) by mouth 3 (three) times daily.   iron polysaccharides 150 MG capsule Commonly known as: NIFEREX Take 150 mg by mouth daily.  niacin  500 MG ER tablet Commonly known as: VITAMIN B3 Take 1 tablet (500 mg total) by mouth daily with breakfast.   olmesartan  20 MG tablet Commonly known as: BENICAR  Take 1 tablet (20 mg total) by mouth daily.   omega-3 acid ethyl esters 1 g capsule Commonly known as: LOVAZA  Take 2 g by mouth 2 (two) times daily.   pantoprazole  40 MG tablet Commonly known as: PROTONIX  TAKE ONE TABLET ONCE TO TWICE DAILY BEFORE A MEAL FOR REFLUX.   PHENObarbital  100 MG tablet Commonly known as: LUMINAL Take 1 tablet (100 mg total) by mouth at bedtime.    phenobarbital  16.2 MG tablet Commonly known as: LUMINAL Take 2 tablets (32.4 mg total) by mouth at bedtime.   rosuvastatin  10 MG tablet Commonly known as: CRESTOR  Take 1 tablet (10 mg total) by mouth daily.   solifenacin  5 MG tablet Commonly known as: VESICARE  Take 1 tablet (5 mg total) by mouth daily.   tolterodine  4 MG 24 hr capsule Commonly known as: DETROL  LA Take 4 mg by mouth at bedtime.   Voltaren  1 % Gel Generic drug: diclofenac  Sodium Apply topically 4 (four) times daily.         Objective:   BP 119/73   Pulse 82   Temp (!) 97.5 F (36.4 C) (Temporal)   Ht 6' (1.829 m)   Wt 247 lb (112 kg)   BMI 33.50 kg/m   Wt Readings from Last 3 Encounters:  10/17/23 247 lb (112 kg)  10/04/23 255 lb 3.2 oz (115.8 kg)  10/01/23 245 lb 9.6 oz (111.4 kg)    Physical Exam Vitals and nursing note reviewed.  Constitutional:      General: He is not in acute distress.    Appearance: He is well-developed. He is not diaphoretic.  Eyes:     General: No scleral icterus.    Conjunctiva/sclera: Conjunctivae normal.  Neck:     Thyroid : No thyromegaly.  Cardiovascular:     Rate and Rhythm: Normal rate and regular rhythm.     Heart sounds: Normal heart sounds. No murmur heard. Pulmonary:     Effort: Pulmonary effort is normal. No respiratory distress.     Breath sounds: Normal breath sounds. No wheezing.  Musculoskeletal:        General: No swelling. Normal range of motion.     Cervical back: Neck supple.  Lymphadenopathy:     Cervical: No cervical adenopathy.  Skin:    General: Skin is warm and dry.     Findings: No rash.  Neurological:     Mental Status: He is alert and oriented to person, place, and time.     Coordination: Coordination normal.  Psychiatric:        Behavior: Behavior normal.     Results for orders placed or performed in visit on 10/04/23  CBC with Differential/Platelet   Collection Time: 10/04/23  2:14 PM  Result Value Ref Range   WBC 3.6 3.4  - 10.8 x10E3/uL   RBC 3.47 (L) 4.14 - 5.80 x10E6/uL   Hemoglobin 11.3 (L) 13.0 - 17.7 g/dL   Hematocrit 66.9 (L) 62.4 - 51.0 %   MCV 95 79 - 97 fL   MCH 32.6 26.6 - 33.0 pg   MCHC 34.2 31.5 - 35.7 g/dL   RDW 86.8 88.3 - 84.5 %   Platelets 230 150 - 450 x10E3/uL   Neutrophils 53 Not Estab. %   Lymphs 36 Not Estab. %   Monocytes 8 Not Estab. %  Eos 3 Not Estab. %   Basos 0 Not Estab. %   Neutrophils Absolute 1.9 1.4 - 7.0 x10E3/uL   Lymphocytes Absolute 1.3 0.7 - 3.1 x10E3/uL   Monocytes Absolute 0.3 0.1 - 0.9 x10E3/uL   EOS (ABSOLUTE) 0.1 0.0 - 0.4 x10E3/uL   Basophils Absolute 0.0 0.0 - 0.2 x10E3/uL   Immature Granulocytes 0 Not Estab. %   Immature Grans (Abs) 0.0 0.0 - 0.1 x10E3/uL  BMP8+EGFR   Collection Time: 10/04/23  2:14 PM  Result Value Ref Range   Glucose 97 70 - 99 mg/dL   BUN 29 (H) 8 - 27 mg/dL   Creatinine, Ser 8.61 (H) 0.76 - 1.27 mg/dL   eGFR 59 (L) >40 fO/fpw/8.26   BUN/Creatinine Ratio 21 10 - 24   Sodium 141 134 - 144 mmol/L   Potassium 4.3 3.5 - 5.2 mmol/L   Chloride 106 96 - 106 mmol/L   CO2 20 20 - 29 mmol/L   Calcium  9.8 8.6 - 10.2 mg/dL    Assessment & Plan:   Problem List Items Addressed This Visit       Cardiovascular and Mediastinum   Hypertension - Primary   Relevant Medications   fenofibrate  (TRICOR ) 145 MG tablet   olmesartan  (BENICAR ) 20 MG tablet     Genitourinary   Stage 3b chronic kidney disease (HCC)     Other   Hyperlipidemia   Relevant Medications   fenofibrate  (TRICOR ) 145 MG tablet   olmesartan  (BENICAR ) 20 MG tablet   Seizures (HCC)   Relevant Medications   gabapentin  (NEURONTIN ) 400 MG capsule   PHENObarbital  (LUMINAL) 100 MG tablet   phenobarbital  (LUMINAL) 16.2 MG tablet   Other Visit Diagnoses       Hypertriglyceridemia       Relevant Medications   fenofibrate  (TRICOR ) 145 MG tablet   olmesartan  (BENICAR ) 20 MG tablet       Continue current medicine, seems to be doing well.  Blood pressure and  everything looks good today.  Kidney function was returning closer to normal, we will recheck it today. Follow up plan: Return in about 3 months (around 01/17/2024), or if symptoms worsen or fail to improve, for Seizure and hypertension and hyperlipidemia.  Counseling provided for all of the vaccine components Orders Placed This Encounter  Procedures   BMP8+EGFR    Fonda Levins, MD [facility] 10/17/2023, 1:31 PM

## 2023-10-18 LAB — BMP8+EGFR
BUN/Creatinine Ratio: 14 (ref 10–24)
BUN: 22 mg/dL (ref 8–27)
CO2: 16 mmol/L — ABNORMAL LOW (ref 20–29)
Calcium: 9.9 mg/dL (ref 8.6–10.2)
Chloride: 108 mmol/L — ABNORMAL HIGH (ref 96–106)
Creatinine, Ser: 1.58 mg/dL — ABNORMAL HIGH (ref 0.76–1.27)
Glucose: 121 mg/dL — ABNORMAL HIGH (ref 70–99)
Potassium: 4 mmol/L (ref 3.5–5.2)
Sodium: 144 mmol/L (ref 134–144)
eGFR: 50 mL/min/1.73 — ABNORMAL LOW (ref >59)

## 2023-10-22 DIAGNOSIS — I4719 Other supraventricular tachycardia: Secondary | ICD-10-CM | POA: Diagnosis not present

## 2023-10-22 DIAGNOSIS — I951 Orthostatic hypotension: Secondary | ICD-10-CM | POA: Diagnosis not present

## 2023-10-22 DIAGNOSIS — I4729 Other ventricular tachycardia: Secondary | ICD-10-CM | POA: Diagnosis not present

## 2023-10-23 NOTE — Telephone Encounter (Signed)
 PA done on Cover My Meds for Rezdifra 100mg . Dx used: MASH (K75.81). ov notes faxed. Waiting on a response

## 2023-10-24 ENCOUNTER — Ambulatory Visit: Payer: Self-pay | Admitting: Family Medicine

## 2023-10-24 DIAGNOSIS — N1832 Chronic kidney disease, stage 3b: Secondary | ICD-10-CM

## 2023-10-24 NOTE — Telephone Encounter (Signed)
 Rezdiffra denied through the insurance. Sending in appeal and faxing appeal paperwork to [phone] to [facility]. Sent ov notes, insurance, labs and reports also called and a number to call to keep check on this is [phone] [facility]). Confirmation returned. Scanned to the pt' chart.

## 2023-11-02 NOTE — Telephone Encounter (Signed)
 FYI:  Faxed appeals letter with signature to [facility] / [employer]. Will also scan to the pt's chart once completed.

## 2023-11-07 ENCOUNTER — Telehealth: Payer: Self-pay

## 2023-11-07 NOTE — Telephone Encounter (Signed)
 Re-faxed the pt's Documentation for an appeal to [phone].

## 2023-11-08 DIAGNOSIS — M8580 Other specified disorders of bone density and structure, unspecified site: Secondary | ICD-10-CM | POA: Diagnosis not present

## 2023-11-08 DIAGNOSIS — C775 Secondary and unspecified malignant neoplasm of intrapelvic lymph nodes: Secondary | ICD-10-CM | POA: Diagnosis not present

## 2023-11-09 NOTE — Telephone Encounter (Signed)
 Re-faxed once again the pt's denial letter to [facility] @ [phone] for the Rezdiffra tabs. Will have Dr Cindie to review

## 2023-11-20 ENCOUNTER — Ambulatory Visit: Admitting: Gastroenterology

## 2023-11-23 NOTE — Telephone Encounter (Signed)
 Re-faxed again pt's denial letter and letter along with other information to [place] @ [phone]

## 2023-11-29 ENCOUNTER — Encounter: Payer: Self-pay | Admitting: Internal Medicine

## 2023-11-29 ENCOUNTER — Other Ambulatory Visit: Payer: Self-pay | Admitting: Family Medicine

## 2023-11-29 ENCOUNTER — Other Ambulatory Visit (HOSPITAL_COMMUNITY): Payer: Self-pay | Admitting: Nephrology

## 2023-11-29 DIAGNOSIS — N2581 Secondary hyperparathyroidism of renal origin: Secondary | ICD-10-CM | POA: Diagnosis not present

## 2023-11-29 DIAGNOSIS — N189 Chronic kidney disease, unspecified: Secondary | ICD-10-CM | POA: Diagnosis not present

## 2023-11-29 DIAGNOSIS — N1831 Chronic kidney disease, stage 3a: Secondary | ICD-10-CM | POA: Diagnosis not present

## 2023-11-29 DIAGNOSIS — I129 Hypertensive chronic kidney disease with stage 1 through stage 4 chronic kidney disease, or unspecified chronic kidney disease: Secondary | ICD-10-CM | POA: Diagnosis not present

## 2023-11-29 DIAGNOSIS — D631 Anemia in chronic kidney disease: Secondary | ICD-10-CM | POA: Diagnosis not present

## 2023-12-04 ENCOUNTER — Telehealth: Payer: Self-pay | Admitting: Internal Medicine

## 2023-12-04 NOTE — Telephone Encounter (Signed)
 Patient called to schedule an appt, said he received a letter in the mail.  I called him back but had to leave a message.

## 2023-12-05 ENCOUNTER — Ambulatory Visit (INDEPENDENT_AMBULATORY_CARE_PROVIDER_SITE_OTHER)

## 2023-12-05 VITALS — BP 119/73 | HR 82 | Ht 73.0 in | Wt 247.0 lb

## 2023-12-05 DIAGNOSIS — Z0000 Encounter for general adult medical examination without abnormal findings: Secondary | ICD-10-CM

## 2023-12-05 LAB — LAB REPORT - SCANNED: EGFR: 74
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
## 2023-12-05 NOTE — Patient Instructions (Signed)
 [name],  Thank you for taking the time for your Medicare Wellness Visit. I appreciate your continued commitment to your health goals. Please review the care plan we discussed, and feel free to reach out if I can assist you further.  Medicare recommends these wellness visits once per year to help you and your care team stay ahead of potential health issues. These visits are designed to focus on prevention, allowing your provider to concentrate on managing your acute and chronic conditions during your regular appointments.  Please note that Annual Wellness Visits do not include a physical exam. Some assessments may be limited, especially if the visit was conducted virtually. If needed, we may recommend a separate in-person follow-up with your provider.  Ongoing Care Seeing your primary care provider every 3 to 6 months helps us  monitor your health and provide consistent, personalized care.   Referrals If a referral was made during today's visit and you haven't received any updates within two weeks, please contact the referred provider directly to check on the status.  Recommended Screenings:  Health Maintenance  Topic Date Due   Pneumococcal Vaccine for age over 65 (1 of 2 - PCV) Never done   Zoster (Shingles) Vaccine (1 of 2) Never done   Medicare Annual Wellness Visit  03/02/2023   Flu Shot  Never done   COVID-19 Vaccine (3 - 2025-26 season) 11/19/2023   Cologuard (Stool DNA test)  01/18/2024   DTaP/Tdap/Td vaccine (2 - Td or Tdap) 03/15/2026   Hepatitis C Screening  Completed   HIV Screening  Completed   Hepatitis B Vaccine  Aged Out   HPV Vaccine  Aged Out   Meningitis B Vaccine  Aged Out   Colon Cancer Screening  Discontinued       12/05/2023    4:09 PM  Advanced Directives  Does Patient Have a Medical Advance Directive? Yes  Type of Estate agent of [place];Living will  Copy of Healthcare Power of Attorney in Chart? No - copy requested   Advance  Care Planning is important because it: Ensures you receive medical care that aligns with your values, goals, and preferences. Provides guidance to your family and loved ones, reducing the emotional burden of decision-making during critical moments.  Vision: Annual vision screenings are recommended for early detection of glaucoma, cataracts, and diabetic retinopathy. These exams can also reveal signs of chronic conditions such as diabetes and high blood pressure.  Dental: Annual dental screenings help detect early signs of oral cancer, gum disease, and other conditions linked to overall health, including heart disease and diabetes.  Please see the attached documents for additional preventive care recommendations.

## 2023-12-06 NOTE — Telephone Encounter (Signed)
 Phoned the pt and LMOVM for a call back regarding paperwork for Rezdriffa needing to be filled out again. Pt left his signatures off

## 2023-12-13 ENCOUNTER — Ambulatory Visit (HOSPITAL_COMMUNITY): Admission: RE | Admit: 2023-12-13 | Source: Ambulatory Visit

## 2023-12-13 ENCOUNTER — Encounter (HOSPITAL_COMMUNITY): Payer: Self-pay

## 2023-12-15 ENCOUNTER — Other Ambulatory Visit: Payer: Self-pay | Admitting: Gastroenterology

## 2023-12-18 DIAGNOSIS — R609 Edema, unspecified: Secondary | ICD-10-CM | POA: Diagnosis not present

## 2023-12-18 DIAGNOSIS — W19XXXA Unspecified fall, initial encounter: Secondary | ICD-10-CM | POA: Diagnosis not present

## 2023-12-24 ENCOUNTER — Telehealth: Payer: Self-pay | Admitting: Family Medicine

## 2023-12-24 NOTE — Telephone Encounter (Signed)
 Copied from CRM [id number]. Topic: Clinical - Medical Advice >> Dec 24, 2023  4:10 PM Tysheama G wrote: Reason for CRM: Patient wants to know why Dr.Dettinger refer him to another kidney doctor and he already has one in [place] which is Dr.Patel which he already request for a test that he has an appointment for tomorrow and he doesn't want to pay for 2 test. Callback number [phone]

## 2023-12-25 ENCOUNTER — Ambulatory Visit (HOSPITAL_COMMUNITY)
Admission: RE | Admit: 2023-12-25 | Discharge: 2023-12-25 | Disposition: A | Discharge status: Home / Self Care | Source: Ambulatory Visit | Attending: Nephrology | Admitting: Nephrology

## 2023-12-25 ENCOUNTER — Encounter (HOSPITAL_COMMUNITY): Payer: Self-pay

## 2023-12-25 DIAGNOSIS — N1831 Chronic kidney disease, stage 3a: Secondary | ICD-10-CM | POA: Insufficient documentation

## 2023-12-25 DIAGNOSIS — N189 Chronic kidney disease, unspecified: Secondary | ICD-10-CM | POA: Diagnosis not present

## 2023-12-25 NOTE — Telephone Encounter (Signed)
 Can nurse look in to this?

## 2023-12-25 NOTE — Telephone Encounter (Signed)
 Left message for patient to call back. Looks like he was referred to see Dr Tobie at [facility] back in July 2025 and Dr Dettinger noted on 10/24/23 after getting patients lab results back that patient needed to be referred to Kidney specialist if he didn't already have a kidney specialist. It looks like another referral was placed at that time. His referral with Dr Tobie at [facility] must have been overlooked. He can disregard referral placed on 10/24/23 to Dr Rachele.

## 2023-12-25 NOTE — Telephone Encounter (Signed)
 Patient called back again, checking status to his question abut why he has another another specialist referral pending when he already has an appt confired with the kidney specialist for Thursday

## 2023-12-26 NOTE — Telephone Encounter (Signed)
 The reason the 2nd Referral was sent was due to location requested in the new Referral was [place] - which would be Dr. Juanito Office.

## 2024-01-07 ENCOUNTER — Encounter: Payer: Self-pay | Admitting: *Deleted

## 2024-01-17 ENCOUNTER — Ambulatory Visit: Admitting: Family Medicine

## 2024-01-17 ENCOUNTER — Ambulatory Visit: Admitting: Internal Medicine

## 2024-01-17 VITALS — BP 117/83 | HR 76 | Temp 98.4°F | Ht 72.0 in | Wt 232.6 lb

## 2024-01-17 DIAGNOSIS — K5900 Constipation, unspecified: Secondary | ICD-10-CM

## 2024-01-17 DIAGNOSIS — K529 Noninfective gastroenteritis and colitis, unspecified: Secondary | ICD-10-CM | POA: Diagnosis not present

## 2024-01-17 DIAGNOSIS — K7581 Nonalcoholic steatohepatitis (NASH): Secondary | ICD-10-CM | POA: Diagnosis not present

## 2024-01-17 DIAGNOSIS — K5909 Other constipation: Secondary | ICD-10-CM | POA: Diagnosis not present

## 2024-01-17 DIAGNOSIS — K2100 Gastro-esophageal reflux disease with esophagitis, without bleeding: Secondary | ICD-10-CM

## 2024-01-17 DIAGNOSIS — K219 Gastro-esophageal reflux disease without esophagitis: Secondary | ICD-10-CM | POA: Diagnosis not present

## 2024-01-17 MED ORDER — REZDIFFRA 100 MG PO TABS: 100.0000 mg | ORAL_TABLET | Freq: Every day | ORAL | 11 refills | Status: DC
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
## 2024-01-17 NOTE — Patient Instructions (Signed)
 It appears we tried to get you on Rezdiffra for your liver disease.  This was denied and we sent an appeal though the paperwork was missing your portion.  We can try to send this in again, unsure if we will get it covered.  Recommend 1-2# weight loss per week until ideal body weight through exercise & diet. Low fat/cholesterol diet.   Avoid sweets, sodas, fruit juices, sweetened beverages like tea, etc. Gradually increase exercise from 15 min daily up to 1 hr per day 5 days/week. Limit alcohol use.  Continue on Colace for constipation.  Continue on pantoprazole  for your chronic acid reflux.  Continue to avoid NSAIDs.  Follow-up in 3 months or sooner if needed.  It was very nice seeing you again today.  Dr. Cindie

## 2024-01-22 ENCOUNTER — Other Ambulatory Visit: Payer: Self-pay | Admitting: *Deleted

## 2024-01-22 DIAGNOSIS — R569 Unspecified convulsions: Secondary | ICD-10-CM

## 2024-01-25 ENCOUNTER — Ambulatory Visit (INDEPENDENT_AMBULATORY_CARE_PROVIDER_SITE_OTHER): Payer: Self-pay | Admitting: Family Medicine

## 2024-01-25 ENCOUNTER — Encounter: Payer: Self-pay | Admitting: Family Medicine

## 2024-01-25 VITALS — BP 120/83 | HR 81 | Ht 72.0 in | Wt 232.0 lb

## 2024-01-25 DIAGNOSIS — I10 Essential (primary) hypertension: Secondary | ICD-10-CM

## 2024-01-25 DIAGNOSIS — E782 Mixed hyperlipidemia: Secondary | ICD-10-CM | POA: Diagnosis not present

## 2024-01-25 DIAGNOSIS — K760 Fatty (change of) liver, not elsewhere classified: Secondary | ICD-10-CM

## 2024-01-25 DIAGNOSIS — C61 Malignant neoplasm of prostate: Secondary | ICD-10-CM

## 2024-01-25 DIAGNOSIS — R569 Unspecified convulsions: Secondary | ICD-10-CM | POA: Diagnosis not present

## 2024-01-25 DIAGNOSIS — N1832 Chronic kidney disease, stage 3b: Secondary | ICD-10-CM

## 2024-01-25 LAB — LIPID PANEL

## 2024-01-25 MED ORDER — PHENOBARBITAL 100 MG PO TABS: 100.0000 mg | ORAL_TABLET | Freq: Every day | ORAL | 0 refills | Status: AC

## 2024-01-25 MED ORDER — PHENOBARBITAL 16.2 MG PO TABS: 32.4000 mg | ORAL_TABLET | Freq: Every day | ORAL | 0 refills | Status: AC

## 2024-01-25 NOTE — Progress Notes (Signed)
 BP 120/83   Pulse 81   Ht 6' (1.829 m)   Wt 232 lb (105.2 kg)   SpO2 97%   BMI 31.46 kg/m    Subjective:   Patient ID: Christopher Salas, male    DOB: 10/22/1965, 58 y.o.   MRN: [id number]  HPI: Christopher Salas is a 58 y.o. male presenting on 01/25/2024 for Medical Management of Chronic Issues, Hyperlipidemia, and Hypertension   Discussed the use of AI scribe software for clinical note transcription with the patient, who gave verbal consent to proceed.  History of Present Illness   Christopher Salas is a 58 year old male with prostate cancer who presents for a recheck of his condition.  Prostate cancer - Diagnosed when PSA level reached 4 - Underwent robotic prostatectomy with significant postoperative pain lasting approximately one month - Currently taking Nubeqa for prostate cancer management - Under ongoing care of an oncologist with next follow-up scheduled in approximately one month  Seizure disorder - History of seizures - Currently taking phenobarbital  for seizure control  Hyperlipidemia - Currently taking cholesterol-lowering medication - Scheduled to follow up with cardiologist for echocardiogram, appointment date unknown  Musculoskeletal pain - Experiences back pain when walking on hard surfaces - Uses treadmills and bicycles at the gym to avoid exacerbating back pain  Weight management - Recently joined a gym to manage weight - Weight decreased from 250 pounds to 232 pounds  Colorectal cancer screening - Cologuard test mix-up due to another individual with same name - Underwent colonoscopy approximately one year and eight months ago          Relevant past medical, surgical, family and social history reviewed and updated as indicated. Interim medical history since our last visit reviewed. Allergies and medications reviewed and updated.  Review of Systems  Constitutional:  Negative for chills and fever.  Eyes:  Negative for discharge.  Respiratory:   Negative for shortness of breath and wheezing.   Cardiovascular:  Negative for chest pain and leg swelling.  Musculoskeletal:  Positive for arthralgias and gait problem. Negative for back pain.  Skin:  Negative for rash.  All other systems reviewed and are negative.   Per HPI unless specifically indicated above   Allergies as of 01/25/2024   No Known Allergies      Medication List        Accurate as of January 25, 2024  2:16 PM. If you have any questions, ask your nurse or doctor.          acetaminophen  500 MG tablet Commonly known as: TYLENOL  Take 500 mg by mouth 3 (three) times daily.   baclofen  10 MG tablet Commonly known as: LIORESAL  TAKE 1 TABLET BY MOUTH THREE TIMES A DAY   cyclobenzaprine  10 MG tablet Commonly known as: FLEXERIL  Take 10 mg by mouth every 8 (eight) hours as needed for muscle spasms.   docusate sodium  100 MG capsule Commonly known as: COLACE Take 1 capsule (100 mg total) by mouth 2 (two) times daily.   fenofibrate  145 MG tablet Commonly known as: TRICOR  Take 1 tablet (145 mg total) by mouth daily.   gabapentin  400 MG capsule Commonly known as: NEURONTIN  Take 1 capsule (400 mg total) by mouth 3 (three) times daily.   iron polysaccharides 150 MG capsule Commonly known as: NIFEREX Take 150 mg by mouth daily.   niacin  500 MG ER tablet Commonly known as: VITAMIN B3 Take 1 tablet (500 mg total) by mouth daily with breakfast.  Nubeqa 300 MG tablet Generic drug: darolutamide Take 600 mg by mouth 2 (two) times daily with a meal.   olmesartan  20 MG tablet Commonly known as: BENICAR  Take 1 tablet (20 mg total) by mouth daily.   omega-3 acid ethyl esters 1 g capsule Commonly known as: LOVAZA  Take 2 g by mouth 2 (two) times daily.   pantoprazole  40 MG tablet Commonly known as: PROTONIX  TAKE ONE TABLET ONCE TO TWICE DAILY BEFORE A MEAL FOR REFLUX.   phenobarbital  16.2 MG tablet Commonly known as: LUMINAL Take 2 tablets (32.4 mg total)  by mouth at bedtime.   PHENObarbital  100 MG tablet Commonly known as: LUMINAL Take 1 tablet (100 mg total) by mouth at bedtime.   Rezdiffra 100 MG Tabs Generic drug: Resmetirom Take 100 mg by mouth daily.   rosuvastatin  10 MG tablet Commonly known as: CRESTOR  Take 1 tablet (10 mg total) by mouth daily.   solifenacin  5 MG tablet Commonly known as: VESICARE  Take 1 tablet (5 mg total) by mouth daily.   tolterodine  4 MG 24 hr capsule Commonly known as: DETROL  LA Take 4 mg by mouth at bedtime.   Voltaren  1 % Gel Generic drug: diclofenac  Sodium Apply topically 4 (four) times daily.         Objective:   BP 120/83   Pulse 81   Ht 6' (1.829 m)   Wt 232 lb (105.2 kg)   SpO2 97%   BMI 31.46 kg/m   Wt Readings from Last 3 Encounters:  01/25/24 232 lb (105.2 kg)  01/17/24 232 lb 9.6 oz (105.5 kg)  12/05/23 247 lb (112 kg)    Physical Exam Physical Exam   VITALS: BP- 120/83 MEASUREMENTS: Weight- 232. NECK: Thyroid  normal, no lumps. CHEST: Lungs clear to auscultation bilaterally. CARDIOVASCULAR: Heart regular rate and rhythm, no murmurs. EXTREMITIES: No peripheral edema.         Assessment & Plan:   Problem List Items Addressed This Visit       Cardiovascular and Mediastinum   Hypertension   Relevant Orders   CBC with Differential/Platelet   CMP14+EGFR   Lipid panel   TSH     Digestive   Fatty liver     Genitourinary   Stage 3b chronic kidney disease (HCC) - Primary   Relevant Orders   CBC with Differential/Platelet   CMP14+EGFR   Lipid panel   TSH   Prostate cancer (HCC)   Relevant Medications   darolutamide (NUBEQA) 300 MG tablet     Other   Hyperlipidemia   Relevant Orders   CBC with Differential/Platelet   CMP14+EGFR   Lipid panel   TSH   Seizures (HCC)   Relevant Medications   phenobarbital  (LUMINAL) 16.2 MG tablet   PHENObarbital  (LUMINAL) 100 MG tablet       Malignant neoplasm of prostate Prostate cancer is well-managed with  Nubeqa. - Continue Nubeqa as prescribed. - Follow up with oncologist, Dr. Alvaro, in one month.  Unspecified convulsions Seizures are well-controlled with phenobarbital . - Continue phenobarbital  as prescribed.  Essential hypertension Blood pressure is well-controlled at 120/83 mmHg. - Continue current antihypertensive regimen.  Mixed hyperlipidemia Cholesterol levels are managed with medication. - Continue current cholesterol medication.  General Health Maintenance Recent colonoscopy was performed approximately 1 year and 8 months ago. Cologuard test mix-up resolved. - Performed blood work today.          Follow up plan: Return in about 3 months (around 04/26/2024), or if symptoms worsen or fail to improve,  for Seizure disorder recheck.  Counseling provided for all of the vaccine components Orders Placed This Encounter  Procedures   CBC with Differential/Platelet   CMP14+EGFR   Lipid panel   TSH    Fonda Levins, MD [facility] 01/25/2024, 2:16 PM

## 2024-01-25 NOTE — Patient Instructions (Signed)
 Dr. Edison-  Cardiology [phone].

## 2024-01-26 LAB — CMP14+EGFR
ALT: 15 IU/L (ref 0–44)
AST: 29 IU/L (ref 0–40)
Albumin: 4.4 g/dL (ref 3.8–4.9)
Alkaline Phosphatase: 84 IU/L (ref 47–123)
BUN/Creatinine Ratio: 11 (ref 10–24)
BUN: 16 mg/dL (ref 8–27)
Bilirubin Total: 0.3 mg/dL (ref 0.0–1.2)
CO2: 22 mmol/L (ref 20–29)
Calcium: 10 mg/dL (ref 8.6–10.2)
Chloride: 109 mmol/L — ABNORMAL HIGH (ref 96–106)
Creatinine, Ser: 1.41 mg/dL — ABNORMAL HIGH (ref 0.76–1.27)
Globulin, Total: 2 g/dL (ref 1.5–4.5)
Glucose: 101 mg/dL — ABNORMAL HIGH (ref 70–99)
Potassium: 4.6 mmol/L (ref 3.5–5.2)
Sodium: 145 mmol/L — ABNORMAL HIGH (ref 134–144)
Total Protein: 6.4 g/dL (ref 6.0–8.5)
eGFR: 57 mL/min/1.73 — ABNORMAL LOW (ref >59)

## 2024-01-26 LAB — LIPID PANEL
Chol/HDL Ratio: 6.2 ratio — ABNORMAL HIGH (ref 0.0–5.0)
Cholesterol, Total: 228 mg/dL — ABNORMAL HIGH (ref 100–199)
HDL: 37 mg/dL — ABNORMAL LOW (ref >39)
LDL Chol Calc (NIH): 114 mg/dL — ABNORMAL HIGH (ref 0–99)
Triglycerides: 444 mg/dL — ABNORMAL HIGH (ref 0–149)
VLDL Cholesterol Cal: 77 mg/dL — ABNORMAL HIGH (ref 5–40)

## 2024-01-26 LAB — CBC WITH DIFFERENTIAL/PLATELET
Basophils Absolute: 0 x10E3/uL (ref 0.0–0.2)
Basos: 0 %
EOS (ABSOLUTE): 0.1 x10E3/uL (ref 0.0–0.4)
Eos: 3 %
Hematocrit: 33.8 % — ABNORMAL LOW (ref 37.5–51.0)
Hemoglobin: 11.5 g/dL — ABNORMAL LOW (ref 13.0–17.7)
Immature Grans (Abs): 0 x10E3/uL (ref 0.0–0.1)
Immature Granulocytes: 0 %
Lymphocytes Absolute: 0.9 x10E3/uL (ref 0.7–3.1)
Lymphs: 31 %
MCH: 33.1 pg — ABNORMAL HIGH (ref 26.6–33.0)
MCHC: 34 g/dL (ref 31.5–35.7)
MCV: 97 fL (ref 79–97)
Monocytes Absolute: 0.2 x10E3/uL (ref 0.1–0.9)
Monocytes: 8 %
Neutrophils Absolute: 1.8 x10E3/uL (ref 1.4–7.0)
Neutrophils: 58 %
Platelets: 228 x10E3/uL (ref 150–450)
RBC: 3.47 x10E6/uL — ABNORMAL LOW (ref 4.14–5.80)
RDW: 11.9 % (ref 11.6–15.4)
WBC: 3 x10E3/uL — ABNORMAL LOW (ref 3.4–10.8)

## 2024-01-26 LAB — TSH: TSH: 2.58 u[IU]/mL (ref 0.450–4.500)

## 2024-01-31 ENCOUNTER — Telehealth: Payer: Self-pay | Admitting: Family Medicine

## 2024-01-31 ENCOUNTER — Ambulatory Visit: Payer: Self-pay | Admitting: Family Medicine

## 2024-01-31 NOTE — Telephone Encounter (Signed)
Please see lab result note.

## 2024-01-31 NOTE — Telephone Encounter (Signed)
 Copied from CRM #[id number]. Topic: General - Other >> Jan 31, 2024  1:43 PM Jasmin G wrote: Reason for CRM: Pt called regarding recent missed call from Ms. Verneda, Chelsea L, CMA, called CAL and she was not available. Call pt back at [phone].

## 2024-02-05 ENCOUNTER — Telehealth (INDEPENDENT_AMBULATORY_CARE_PROVIDER_SITE_OTHER): Payer: Self-pay | Admitting: *Deleted

## 2024-02-05 NOTE — Telephone Encounter (Signed)
 Voice mail left on Christopher Salas's phones   He is checking back on his trial medication.  He was told to expect a call back and he is concerned he has not.  Would like a call back to know where it stands currently.  Last note ----On previous visit, we attempted to prescribe patient Rezdiffra.  This was denied.  We filled out further paperwork/appeal although patient left his signature is off.  We attempted to contact him multiple times in this regard though this was never completed.   Called patient  --told him I am sure we are working on this however to call back in a week to check on this again as this sometimes takes awhile to get approved.  He agreed.

## 2024-02-06 NOTE — Telephone Encounter (Signed)
 See other phone notes.

## 2024-02-06 NOTE — Telephone Encounter (Signed)
 Voice mail left on Dena's phones    He is checking back on his trial medication.  He was told to expect a call back and he is concerned he has not.  Would like a call back to know where it stands currently.   Last note ----On previous visit, we attempted to prescribe patient Rezdiffra.  This was denied.  We filled out further paperwork/appeal although patient left his signature is off.  We attempted to contact him multiple times in this regard though this was never completed.    Called patient  --told him I am sure we are working on this however to call back in a week to check on this again as this sometimes takes awhile to get approved.  He agreed.     02/06/2024  Phoned the appeals line @[phone] spoke with Syonvia and was advised after being on hold for 10 minutes (this was the 2nd call, the first was on hold was 17 minutes without help) was advised I needed to fax to the pt's [facility] appeals dept. Documentation faxed waiting on confirmation to come back and waiting on a response regarding the medication.

## 2024-02-11 NOTE — Telephone Encounter (Signed)
 Appeals letter scanned to chart with extra questions. [name] was advised of this as well

## 2024-02-18 ENCOUNTER — Ambulatory Visit (HOSPITAL_COMMUNITY)
Admission: RE | Admit: 2024-02-18 | Discharge: 2024-02-18 | Disposition: A | Source: Ambulatory Visit | Attending: Cardiology | Admitting: Cardiology

## 2024-02-18 ENCOUNTER — Ambulatory Visit: Payer: Self-pay | Admitting: Cardiology

## 2024-02-18 DIAGNOSIS — I10 Essential (primary) hypertension: Secondary | ICD-10-CM | POA: Diagnosis not present

## 2024-02-18 DIAGNOSIS — I428 Other cardiomyopathies: Secondary | ICD-10-CM

## 2024-02-18 DIAGNOSIS — I429 Cardiomyopathy, unspecified: Secondary | ICD-10-CM | POA: Insufficient documentation

## 2024-02-18 LAB — ECHOCARDIOGRAM COMPLETE
Area-P 1/2: 4.06 cm2
S' Lateral: 3.5 cm

## 2024-02-18 NOTE — Progress Notes (Signed)
*  PRELIMINARY RESULTS* Echocardiogram 2D Echocardiogram has been performed.  Christopher Salas 02/18/2024, 11:13 AM

## 2024-02-19 ENCOUNTER — Encounter: Payer: Self-pay | Admitting: Internal Medicine

## 2024-02-22 NOTE — Telephone Encounter (Signed)
 Patient returned RN's call regarding results.

## 2024-02-26 ENCOUNTER — Telehealth: Payer: Self-pay

## 2024-02-26 ENCOUNTER — Other Ambulatory Visit: Payer: Self-pay

## 2024-02-26 MED ORDER — REZDIFFRA 100 MG PO TABS: 100.0000 mg | ORAL_TABLET | Freq: Every day | ORAL | 11 refills | Status: AC

## 2024-02-26 NOTE — Telephone Encounter (Signed)
 Appeal for Rezdiffra  100mg  has been approved and new Rx has been sent to [facility] for delivery. Approval documents to be scanned into patient's chart.

## 2024-03-23 ENCOUNTER — Other Ambulatory Visit: Payer: Self-pay | Admitting: Family Medicine

## 2024-03-24 ENCOUNTER — Telehealth: Payer: Self-pay

## 2024-03-24 NOTE — Telephone Encounter (Signed)
"  error  "

## 2024-03-26 NOTE — Telephone Encounter (Signed)
 Phoned the pt and advised him that his Rezdiffra  had been approved 02/26/2024 and it was through [facility]. The pt was given the number [phone]) to call and have them to deliver to him

## 2024-04-08 ENCOUNTER — Telehealth (INDEPENDENT_AMBULATORY_CARE_PROVIDER_SITE_OTHER): Payer: Self-pay

## 2024-04-08 NOTE — Telephone Encounter (Signed)
 I have reached out to [place], and she says per secure Chat, yes filling out so the pt can sign them. I just stamped Dr Cindie name to it. pt can come sign and I can fax or however you want to it. yes as you can tell he is something else     Patient called today 04/08/2024 stating we should be getting a form to fill out to him him with his medication.   I asked him what medication was it he said he thought it was for his kidney or liver.  I asked him whom the form was going to come from. He did not know.   I asked that he please find out this information and call me back with it.   Patient says the medication (rezdiffra ?) would cost 3,000 dollars with out insurance and with insurance it would cost 1,600 and he can not afford this.    Looks like the patient was supposed to have started on Rezdiffra , as per telephone note from Dena Broadnax, CMA on 03/26/2024   Phoned the pt and advised him that his Rezdiffra  had been approved 02/26/2024 and it was through [facility]. The pt was given the number [phone]) to call and have them to deliver to him   And prior note from Madelin Boom CMA on 02/26/2024  Appeal for Rezdiffra  100mg  has been approved and new Rx has been sent to [facility] for delivery. Approval documents to be scanned into patient's chart.

## 2024-04-09 NOTE — Telephone Encounter (Signed)
 Tried calling patient again, No answer.

## 2024-04-09 NOTE — Telephone Encounter (Signed)
 I called and left a detailed message that [facility] CMA, had the patient's forms her was inquiring of and she needed him to go by the [employer] location to sign those. I asked that he please give me a call to let me know he received the message.

## 2024-04-10 ENCOUNTER — Telehealth: Payer: Self-pay | Admitting: Internal Medicine

## 2024-04-10 NOTE — Telephone Encounter (Signed)
 Patient called again, says he is confused on where to go to have the forms signed. I made him aware that He is to go to [address] and ask for Dena, so he can sign the forms so Dena can fax them for him. He states understanding.

## 2024-04-10 NOTE — Telephone Encounter (Signed)
 Pt signed documentation. Will fax once it comes through e-mail

## 2024-04-10 NOTE — Telephone Encounter (Signed)
 Noted.

## 2024-04-10 NOTE — Telephone Encounter (Signed)
 Phoned and LMOVM for the pt to come by [address] and sign his paperwork so it can be faxed.

## 2024-04-10 NOTE — Telephone Encounter (Signed)
 Pt called to say he received a message about picking up a paper but he didn't know where to go.  He said he was told that Dr. Cindie wasn't here any longer.  I looked at Dena's note and told him he needed to come to [address] Where he came for his appointments.

## 2024-04-10 NOTE — Telephone Encounter (Signed)
 This patient just called back here stating he read the message I sent to him and asked what else he needs to do. Please call him at [phone] to explain again he needs to come to [address] to sign the forms.   Thank you!

## 2024-04-11 NOTE — Telephone Encounter (Signed)
 Scanned to the pt's chart

## 2024-04-24 ENCOUNTER — Telehealth: Payer: Self-pay

## 2024-04-24 NOTE — Telephone Encounter (Signed)
 Documentation from [employer] / Diagnosis Verification Form re-faxed with Dx code: K75.81 (Metabolic Dysfunction Associated Steatohepatitis) will send the pt a MyChart message regarding this. Pt has phoned and left several messages regarding this while I was on the phone with [employer].

## 2024-04-28 ENCOUNTER — Ambulatory Visit: Admitting: Family Medicine

## 2024-06-06 ENCOUNTER — Ambulatory Visit: Admitting: Family Medicine

## 2024-12-05 ENCOUNTER — Ambulatory Visit: Payer: Self-pay
